# Patient Record
Sex: Male | Born: 1940
Health system: Southern US, Community
[De-identification: ages and names within clinical notes are randomized; demographics above are authoritative.]

## PROBLEM LIST (undated history)

## (undated) DIAGNOSIS — M1990 Unspecified osteoarthritis, unspecified site: Secondary | ICD-10-CM

## (undated) DIAGNOSIS — I10 Essential (primary) hypertension: Secondary | ICD-10-CM

## (undated) DIAGNOSIS — K219 Gastro-esophageal reflux disease without esophagitis: Secondary | ICD-10-CM

## (undated) DIAGNOSIS — R251 Tremor, unspecified: Secondary | ICD-10-CM

## (undated) DIAGNOSIS — I219 Acute myocardial infarction, unspecified: Secondary | ICD-10-CM

## (undated) DIAGNOSIS — E785 Hyperlipidemia, unspecified: Secondary | ICD-10-CM

## (undated) DIAGNOSIS — H409 Unspecified glaucoma: Secondary | ICD-10-CM

## (undated) DIAGNOSIS — I2510 Atherosclerotic heart disease of native coronary artery without angina pectoris: Secondary | ICD-10-CM

## (undated) HISTORY — PX: KNEE SURGERY: SHX244

## (undated) HISTORY — DX: Hyperlipidemia, unspecified: E78.5

## (undated) HISTORY — PX: OTHER SURGICAL HISTORY: SHX169

## (undated) HISTORY — DX: Atherosclerotic heart disease of native coronary artery without angina pectoris: I25.10

## (undated) HISTORY — PX: BACK SURGERY: SHX140

## (undated) HISTORY — PX: EYE SURGERY: SHX253

## (undated) HISTORY — DX: Essential (primary) hypertension: I10

## (undated) HISTORY — PX: HAND SURGERY: SHX662

## (undated) HISTORY — PX: ELBOW SURGERY: SHX618

## (undated) HISTORY — PX: SHOULDER SURGERY: SHX246

---

## 2000-10-17 ENCOUNTER — Ambulatory Visit (HOSPITAL_BASED_OUTPATIENT_CLINIC_OR_DEPARTMENT_OTHER): Admission: RE | Admit: 2000-10-17 | Discharge: 2000-10-17 | Payer: Self-pay | Admitting: Orthopedic Surgery

## 2000-12-19 ENCOUNTER — Encounter (INDEPENDENT_AMBULATORY_CARE_PROVIDER_SITE_OTHER): Payer: Self-pay | Admitting: *Deleted

## 2000-12-19 ENCOUNTER — Ambulatory Visit (HOSPITAL_BASED_OUTPATIENT_CLINIC_OR_DEPARTMENT_OTHER): Admission: RE | Admit: 2000-12-19 | Discharge: 2000-12-20 | Payer: Self-pay | Admitting: Orthopedic Surgery

## 2001-05-29 ENCOUNTER — Ambulatory Visit (HOSPITAL_BASED_OUTPATIENT_CLINIC_OR_DEPARTMENT_OTHER): Admission: RE | Admit: 2001-05-29 | Discharge: 2001-05-30 | Payer: Self-pay | Admitting: Orthopedic Surgery

## 2006-03-06 DIAGNOSIS — I219 Acute myocardial infarction, unspecified: Secondary | ICD-10-CM

## 2006-03-06 HISTORY — DX: Acute myocardial infarction, unspecified: I21.9

## 2006-03-06 HISTORY — PX: CORONARY ANGIOPLASTY: SHX604

## 2006-03-06 HISTORY — PX: CARDIAC CATHETERIZATION: SHX172

## 2006-07-28 ENCOUNTER — Ambulatory Visit: Payer: Self-pay | Admitting: Internal Medicine

## 2006-07-28 ENCOUNTER — Encounter: Payer: Self-pay | Admitting: Emergency Medicine

## 2006-07-28 ENCOUNTER — Inpatient Hospital Stay (HOSPITAL_COMMUNITY): Admission: EM | Admit: 2006-07-28 | Discharge: 2006-08-01 | Payer: Self-pay | Admitting: Internal Medicine

## 2006-08-20 ENCOUNTER — Ambulatory Visit: Payer: Self-pay | Admitting: Cardiology

## 2006-10-19 ENCOUNTER — Ambulatory Visit: Payer: Self-pay | Admitting: Cardiology

## 2006-10-19 LAB — CONVERTED CEMR LAB
ALT: 24 units/L (ref 0–53)
Albumin: 3.7 g/dL (ref 3.5–5.2)
Alkaline Phosphatase: 102 units/L (ref 39–117)
LDL Cholesterol: 38 mg/dL (ref 0–99)
Total CHOL/HDL Ratio: 3.3

## 2006-12-07 ENCOUNTER — Ambulatory Visit: Payer: Self-pay | Admitting: Cardiology

## 2007-03-15 ENCOUNTER — Ambulatory Visit: Payer: Self-pay

## 2007-06-18 ENCOUNTER — Ambulatory Visit: Payer: Self-pay | Admitting: Cardiology

## 2008-06-24 DIAGNOSIS — E785 Hyperlipidemia, unspecified: Secondary | ICD-10-CM

## 2008-06-24 DIAGNOSIS — I10 Essential (primary) hypertension: Secondary | ICD-10-CM

## 2008-06-30 DIAGNOSIS — I2510 Atherosclerotic heart disease of native coronary artery without angina pectoris: Secondary | ICD-10-CM

## 2008-07-01 ENCOUNTER — Ambulatory Visit: Payer: Self-pay | Admitting: Cardiology

## 2009-02-11 ENCOUNTER — Encounter: Payer: Self-pay | Admitting: Cardiology

## 2009-07-02 ENCOUNTER — Ambulatory Visit: Payer: Self-pay | Admitting: Cardiology

## 2010-04-05 NOTE — Assessment & Plan Note (Signed)
Summary: 1 YR F/U   Visit Type:  Follow-up- 1 year Primary Provider:  Dr. Quintella Reichert  CC:  No complaints of CP or SOB. The pt does have some occasional LE edema- but he relates this to his knee problems. He may be pending knee surgery in the near future. He is pending laser surgery to his eyes for glaucoma..  History of Present Illness: The patient is 70 years old and return for followup management of CAD. In 2007 he had a non-ST elevation MI treated with a Taxus stent to the diagonal branch of the LAD. He had 70-80% narrowing the right coronary and later had a negative Myoview scan.  He has done quite well over the past 2 years with no chest pain shortness of breath or palpitations. He's remained active.  His other problems include hypertension and hyperlipidemia.  Since Dr. Harlene Salts retirement he is now being seen in Dr. Jearl Klinefelter office.  Current Medications (verified): 1)  Inderal La 60 Mg Xr24h-Cap (Propranolol Hcl) .Marland Kitchen.. 1 Capsule By Mouth Once A Day 2)  Lisinopril 10 Mg Tabs (Lisinopril) .... Take 1 Tablet Once A Day 3)  Simvastatin 40 Mg Tabs (Simvastatin) .... Take 1 Tablet By Mouth At Bedtime 4)  Aspirin 81 Mg Tbec (Aspirin) .... Take One Tablet By Mouth Daily 5)  Chlordiazepoxide Hcl 25 Mg Caps (Chlordiazepoxide Hcl) .... Take 1 By Mouth Once Daily 6)  Travatan Z 0.004 % Soln (Travoprost) .... As Directed 7)  Aleve 220 Mg Tabs (Naproxen Sodium) .... As Needed 8)  Propoxyphene N-Apap 100-500 Mg Tabs (Propoxyphene N-Apap) .... Take 1 Tablet Every 6 Hrs As Needed Pain 9)  Nexium 40 Mg Cpdr (Esomeprazole Magnesium) .... Take One Tab By Mouth Once Daily As Needed  Allergies (verified): No Known Drug Allergies  Past History:  Past Medical History: Reviewed history from 06/24/2008 and no changes required. 1. Coronary artery disease status post non-ST-elevation myocardial     infarction in May 2008 with treatment of a diagonal branch lesion     with a Taxus drug-eluting stent. 2.  Seventy to eighty percent residual stenosis in the right coronary     but with a negative Myoview scan. 3. Good left ventricular function. 4. Adapta DES study. 5. Hyperlipidemia with low HDL, improved on simvastatin. 6. Hypertension.   Review of Systems       ROS is negative except as outlined in HPI.   Vital Signs:  Patient profile:   70 year old male Height:      68 inches Weight:      229.50 pounds BMI:     35.02 Pulse rate:   67 / minute Pulse rhythm:   regular BP sitting:   118 / 64  (left arm) Cuff size:   large  Vitals Entered By: Sherri Rad, RN, BSN (July 02, 2009 9:13 AM)  Physical Exam  Additional Exam:  Gen. Well-nourished, in no distress   Neck: No JVD, thyroid not enlarged, no carotid bruits Lungs: No tachypnea, clear without rales, rhonchi or wheezes Cardiovascular: Rhythm regular, PMI not displaced,  heart sounds  normal, no murmurs or gallops, no peripheral edema, pulses normal in all 4 extremities. Abdomen: BS normal, abdomen soft and non-tender without masses or organomegaly, no hepatosplenomegaly. MS: No deformities, no cyanosis or clubbing   Neuro:  No focal sns   Skin:  no lesions    Impression & Recommendations:  Problem # 1:  CAD, NATIVE VESSEL (ICD-414.01) He had previous PCI procedure in 2007 with a  drug-eluting stent to the diagonal branch. He's had no recent chest pain and this problem is stable. His updated medication list for this problem includes:    Inderal La 60 Mg Xr24h-cap (Propranolol hcl) .Marland Kitchen... 1 capsule by mouth once a day    Lisinopril 10 Mg Tabs (Lisinopril) .Marland Kitchen... Take 1 tablet once a day    Aspirin 81 Mg Tbec (Aspirin) .Marland Kitchen... Take one tablet by mouth daily  Orders: EKG w/ Interpretation (93000)  Problem # 2:  HYPERTENSION, CONTROLLED (ICD-401.1) This is well-controlled on current medications. His updated medication list for this problem includes:    Inderal La 60 Mg Xr24h-cap (Propranolol hcl) .Marland Kitchen... 1 capsule by mouth once  a day    Lisinopril 10 Mg Tabs (Lisinopril) .Marland Kitchen... Take 1 tablet once a day    Aspirin 81 Mg Tbec (Aspirin) .Marland Kitchen... Take one tablet by mouth daily  Problem # 3:  HYPERLIPIDEMIA (ICD-272.4) He had recent lipid profile at his primary care physician's office. Will S. Anderson as those reports. His updated medication list for this problem includes:    Simvastatin 40 Mg Tabs (Simvastatin) .Marland Kitchen... Take 1 tablet by mouth at bedtime  Patient Instructions: 1)  Your physician wants you to follow-up in: 1 year with Dr. Clifton James.  You will receive a reminder letter in the mail two months in advance. If you don't receive a letter, please call our office to schedule the follow-up appointment. 2)  Please have Dr. Jearl Klinefelter office fax [place] a copy of your last cholesterol panel. Prescriptions: SIMVASTATIN 40 MG TABS (SIMVASTATIN) Take 1 tablet by mouth at bedtime  #30 x 12   Entered by:   Sherri Rad, RN, BSN   Authorized by:   Lenoria Farrier, MD, [facility]   Signed by:   Sherri Rad, RN, BSN on 07/02/2009   Method used:   Electronically to        [facility] [address] [address] [phone]* (retail)       [address]       [place], [place]  [address]       Ph: [phone]       Fax: [phone]   RxID:   [id number] LISINOPRIL 10 MG TABS (LISINOPRIL) Take 1 tablet once a day  #30 x 12   Entered by:   Sherri Rad, RN, BSN   Authorized by:   Lenoria Farrier, MD, [facility]   Signed by:   Sherri Rad, RN, BSN on 07/02/2009   Method used:   Electronically to        [facility] [address] [address] [phone]* (retail)       [address]       [place], [place]  [address]       Ph: [phone]       Fax: [phone]   RxID:   [id number] INDERAL LA 60 MG XR24H-CAP (PROPRANOLOL HCL) 1 capsule by mouth once a day  #30 x 12   Entered by:   Sherri Rad, RN, BSN   Authorized by:   Lenoria Farrier, MD, [facility]   Signed by:   Sherri Rad, RN, BSN on 07/02/2009   Method used:   Electronically to        [facility] [address] [phone]* (retail)       [address]       [place], [place]  [address]       Ph: [phone]       Fax: [phone]   RxID:   [id number]

## 2010-06-15 ENCOUNTER — Other Ambulatory Visit: Payer: Self-pay | Admitting: *Deleted

## 2010-06-15 MED ORDER — SIMVASTATIN 40 MG PO TABS: 40.0000 mg | ORAL_TABLET | Freq: Every day | ORAL | Status: DC

## 2010-06-15 MED ORDER — LISINOPRIL 10 MG PO TABS: 10.0000 mg | ORAL_TABLET | Freq: Every day | ORAL | Status: DC

## 2010-06-15 MED ORDER — PROPRANOLOL HCL ER 60 MG PO CP24: 60.0000 mg | ORAL_CAPSULE | Freq: Every day | ORAL | Status: DC

## 2010-07-19 NOTE — Assessment & Plan Note (Signed)
[facility]                            CARDIOLOGY OFFICE NOTE   NAME:[name] [name], [name] [name]                         MRN:          [id number]  DATE:06/18/2007                            DOB:          Jan 16, 1941    PRIMARY CARE PHYSICIAN:  Lianne Bushy, M.D.   CLINICAL HISTORY:  Mr. Tuman is 70 years old and returns for management  of coronary heart disease.  In May of 2007, had had a non-ST-elevation  myocardial infarction and underwent stenting of diagonal branch of the  LAD with a Taxus stent.  He had residual 70-80% narrowing in the right  coronary.  He had a Myoview scan performed in January of year which  showed no evidence of ischemia.  His ejection fraction was 66%.   He said he has been feeling well.  He has had no chest pain, shortness  breath or palpitations.  He did complain of very easy bruising.   PAST MEDICAL HISTORY:  Hyperlipidemia.  His HDL was low, and we switched  him from Lipitor to simvastatin on the last visit.  His HDL came from 26  to 77 with that switch, and his LDL remained below target.   CURRENT MEDICATIONS:  1. Aspirin.  2. Plavix.  3. Lisinopril 10 mg daily.  4. Inderal LA 6 mg daily.  5. Simvastatin 40 mg daily.   PHYSICAL EXAMINATION:  VITAL SIGNS:  Blood pressure is 128/78, pulse 64  and regular.  NECK:  There is no venous tension.  The carotid pulses were full without  bruits.  CHEST:  Clear.  CARDIAC:  Rhythm was regular.  I could hear no murmurs or gallops.  ABDOMEN:  Soft without organomegaly.  EXTREMITIES:  Peripheral pulses were full with no peripheral edema.   IMPRESSION:  1. Coronary artery disease status post non-ST-elevation myocardial      infarction in May 2008 with treatment of a diagonal branch lesion      with a Taxus drug-eluting stent.  2. Seventy to eighty percent residual stenosis in the right coronary      but with a negative Myoview scan.  3. Good left ventricular function.  4. Adapta ES  study.  5. Hyperlipidemia with low HDL, improved on simvastatin.  6. Hypertension.   RECOMMENDATIONS:  I think Mr. Bonus is doing well.  He is having quite  a bit of bruising, so I think we can stop the Plavix a year post  intervention, which will be the end of May.  With renew his other  medications.  His HDL is still low, and I encouraged him to avoid the  bad carbohydrates, and we talked about setting a target weight of 200  pounds; he is currently 223.  I will plan to see him back in follow up  in a year.     Bruce Elvera Lennox Juanda Chance, MD, [facility]  Electronically Signed    BRB/MedQ  DD: 06/18/2007  DT: 06/18/2007  Job #: [id number]

## 2010-07-19 NOTE — H&P (Signed)
NAME:  [name] [name], [name] [name]                ACCOUNT NO.:  [id number]   MEDICAL RECORD NO.:  [id number]          PATIENT TYPE:  EMS   LOCATION:  ED                           FACILITY:  [facility]   PHYSICIAN:  Bevelyn Buckles. Bensimhon, MDDATE OF BIRTH:  1940/07/22   DATE OF ADMISSION:  07/28/2006  DATE OF DISCHARGE:                              HISTORY & PHYSICAL   PRIMARY CARE PHYSICIAN:  Lianne Bushy, M.D.   REASON FOR ADMISSION:  Left chest, jaw, and shoulder pain, concerning  for possible non-ST elevation, myocardial infarction.   HISTORY OF PRESENT ILLNESS:  Mr. Moroney is a very pleasant 70 year old  male with a history of glaucoma and obesity.  He denies any history of  hypertension, hyperlipidemia, or known coronary artery disease.  He did  have a coronary catheterization approximately 20 years ago by Dr. Juanda Chance  for chest pain, which he tells me was okay.  This morning, he had a 2-3  hour episode of upper left chest pain radiating to his jaw and  shoulders.  There was no associated nausea, vomiting, diaphoresis, or  shortness of breath.  He came to the emergency room.  This was relieved  spontaneously.  EKG was nonacute.  Point-of-care markers showed a CK of  119 with an MB fraction of 5.9.  Troponin was 0.15.  He remains  painfree.   REVIEW OF SYSTEMS:  He is fairly active but somewhat limited by  bilateral knee osteoarthritis.  He denies any exertional discomfort.  He  has not had any heart failure symptoms.  He denies fevers, chills,  cough.  No bright red blood per rectum or melena.  No claudication.  He  does have a history of glaucoma and has undergone multiple laser eye  surgeries, most recently on the left eye yesterday.  Otherwise, the  remainder of the review of systems is negative except for HPI and  problem list.   PROBLEM LIST:  1. Obesity.  2. Glaucoma, status post laser eye surgery x2 on the right and x2 on      the left, most recently on May 23.  3. Osteoarthritis.  4.  Essential tremor.   CURRENT MEDICATIONS:  1. Inderal 60 mg a day.  2. Librium 25 mg a day.  3. Travatan eye drops 1 drop in each eye daily.  4. Cosopt eye drops 1 drop in each eye b.i.d.   He does not take aspirin.   ALLERGIES:  No known drug allergies.   SOCIAL HISTORY:  He is retired.  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  He denies a history of tobacco.  He  does drink one beer every few weeks.   FAMILY HISTORY:  Mother is alive at 67.  No coronary artery disease.  Father died at 68 due to a brain tumor.  One brother who died recently  due to stomach cancer.  He has a half brother and half sister as well.   PHYSICAL EXAMINATION:  GENERAL:  He is well-appearing in no acute  distress.  Respirations are unlabored.  VITAL SIGNS:  Blood pressure 150/88, heart rate 64.  He is satting 99%  on room air.  Respirations are 18.  HEENT:  Normal.  NECK:  Supple.  No JVD.  Carotids are 2+ bilaterally without bruits.  There is no lymphadenopathy or thyromegaly.  CARDIAC:  PMI is normal.  He has a regular rate and rhythm.  No murmurs,  rubs or gallops.  CHEST WALL:  Nontender to palpation.  LUNGS:  Clear.  ABDOMEN:  Obese, nontender, nondistended.  No hepatosplenomegaly.  No  bruits.  No masses appreciated.  He has good bowel sounds.  Abdominal  aorta and pulses not palpable.  EXTREMITIES:  Warm with no clubbing or cyanosis.  He has trace edema  bilaterally.  Good distal pulses.  No rash.  NEURO:  He is alert and oriented x3.  Cranial nerves II-XII are intact.  He move all four extremities without difficulty.  Affect is pleasant.   EKG shows normal sinus rhythm at a rate of 64.  He has LVH with mild ST  segment sagging in V3 and V4, which is nonspecific.   LABS:  White count 6.9, hemoglobin 15.9, platelet count 197.  D-dimer is  negative at 0.48.  Sodium is 140, potassium 4.3, chloride 107, bicarb  27, glucose 121, BUN 13, creatinine 1.05.  Total CK was  119, MB 5.9,  troponin is 0.15.  The second set of cardiac markers just came back with  a total CK of 292, CK-MB 26.4, consistent with non-ST elevation  myocardial infarction.   ASSESSMENT:  1. Non-ST elevation myocardial infarction.  2. Hypertension, an apparent new diagnosis.  3. Glaucoma, status post laser eye surgery, most recently on the left      on May 23.  4. Left ventricular hypertrophy.   PLAN/DISCUSSION:  Mr. Cokley presents with a non-ST elevation myocardial  infarction.  We will continue beta blocker, start him on Lovenox,  statin, and aspirin.  Also start him on Integrilin if okay with  ophthalmology.  Will need to set him up for cardiac catheterization on  Tuesday or Wednesday.  Of note, the patient is very reluctant to stay,  but I tried to emphasize to him the need for medical care for his heart  attack and the risk of leaving the hospital.  We will transfer him to  the step-down unit at [facility] for closer observation in his peri-  infarct period.      Bevelyn Buckles. Bensimhon, MD  Electronically Signed     DRB/MEDQ  D:  07/28/2006  T:  07/28/2006  Job:  [id number]   cc:   Lianne Bushy, M.D.  Fax: [phone]

## 2010-07-19 NOTE — Cardiovascular Report (Signed)
NAME:  [name] [name], [name] [name]                ACCOUNT NO.:  [id number]   MEDICAL RECORD NO.:  [id number]          PATIENT TYPE:  INP   LOCATION:  2919                         FACILITY:  [facility]   PHYSICIAN:  Bruce R. Juanda Chance, MD, FACCDATE OF BIRTH:  1940/06/04   DATE OF PROCEDURE:  07/31/2006  DATE OF DISCHARGE:                            CARDIAC CATHETERIZATION   HISTORY:  [name] [name] is 70 years old and is known to me 20 years ago.  He was admitted to the hospital with chest pain and positive enzymes,  consistent with a non-ST-elevation myocardial infarction.  He is  scheduled for evaluation angiography today.  He was started on  Integrilin and Lovenox.   PROCEDURE:  The procedure was performed by the right femoral artery  using arterial sheath and 6-French preformed coronary catheters.  A  front wall arterial puncture was performed and Omnipaque contrast was  used.  At completion of the diagnostic study, we made a decision to  proceed with intervention on the totally occluded diagonal branch of the  LAD.   The patient had been given Lovenox within 6 hours, so no additional  anticoagulation was given.  He was on an Integrilin drip.  He was given  600 mg of Plavix, 20 mg of Pepcid and 4 chewable baby aspirin.  We used  a 6-French Q-4 guiding catheter with side holes and a Prowater wire.  We  crossed the totally occluded diagonal branch with the wire without too  much difficulty.  We predilated with a 2.25 x 12-mm Maverick, performing  2 inflations up to 8 atmospheres for 30 seconds.  We then deployed a 2.5  x 20 mm Taxus stent, deploying this with one inflation of 14 atmospheres  for 30 seconds.  We then postdilated with a 2.75 x 15-mm Quantum  Maverick, performing 2 inflations up to 16 atmospheres for 20 seconds.   Final diagnostic studies were then  informed through the guiding  catheter.  The patient tolerated the procedure well and left the  laboratory in satisfactory condition.   RESULTS:  1. The left main coronary was free of significant disease.  2. Left anterior descending artery gave rise to a large diagonal      branch, which was completely occluded near its proximal portion.      The LAD then continued but ended before the apex.  It supplied 2      moderate sized septal perforators.  3. The circumflex artery gave rise to a small and large marginal      branch, and a large posterolateral branch.  These vessels were free      of disease.  4. The right coronary artery was a moderate-sized vessel; it gave rise      to a conus branch, a right ventricle branch and a posterior      descending branch.  There was 70-80% narrowing in the proximal      right coronary artery.  It was not certain whether this was      catheter-induced spasm or a lesion.  It looked worse on the second  injection than the first injection, which might suggest there was      some element of spasm.   LEFT VENTRICULOGRAPHY:  The left ventriculogram performed in the RAO  projection showed hypokinesis of the anterolateral wall out to the tip  of the apex.  The vessel wall motion was good.  The estimated ejection  fraction was 60%.   Following stenting of the lesion, the diagonal branch stenosis improved  from 100% to 0%, and the flow improved from TIMI 0 to TIMI 3 flow.   CONCLUSION:  1. Non-ST elevation myocardial infarction, with total occlusion of a      large diagonal branch of the LAD, no major obstruction of the      circumflex artery, 70-80% narrowing in the proximal right coronary,      and anterolateral wall hypokinesis with an estimated ejection      fraction of 50%.  2. Successful percutaneous coronary intervention of the totally      occluded diagonal branch, using a Taxus drug-eluting stent -- with      improvement in percent diameter narrowing from 100% to 0%, and      improvement flow from TIMI 0 to TIMI 3 flow.   DISPOSITION:  The patient to the post anesthesia unit  for further  observation.  I think medical treatment would be the best option for the  residual right coronary disease, and some of this may be spasm.  It is  does not appear real favorable for percutaneous coronary intervention,  although this could be done.      Bruce Elvera Lennox Juanda Chance, MD, [facility]  Electronically Signed     BRB/MEDQ  D:  07/31/2006  T:  08/01/2006  Job:  [id number]   cc:   [name] [name], M.D.

## 2010-07-19 NOTE — Assessment & Plan Note (Signed)
[facility]                            CARDIOLOGY OFFICE NOTE   NAME:Lundblad, Kamali                         MRN:          [id number]  DATE:08/20/2006                            DOB:          1941-01-25    CARDIOLOGIST:  Everardo Beals. Juanda Chance, MD, [facility].   PRIMARY CARE PHYSICIAN:  Lianne Bushy, M.D.   HISTORY OF PRESENT ILLNESS:  Mr. Lucci is a 70 year old male patient  who recently presented to [facility] with a non-ST elevation  myocardial infarction.  He underwent cardiac catheterization by Dr.  Juanda Chance and was found to have a total occlusion of the large diagonal  branch of the LAD.  This was treated with a TAXUS drug-eluting stent.  He had residual 70-80% narrowing in the proximal RCA.  This was treated  medically.  His EF was 50% with anterolateral wall hypokinesis.  He  returns to the office today for post hospitalization followup.  He is  doing well without any chest pain, shortness of breath.  He denies any  syncope or near syncope.  He denies any orthopnea, paroxysmal nocturnal  dyspnea.  He denies any palpitations.  He is having trouble with his  tremors and would like to switch back from Toprol to Inderal.   CURRENT MEDICATIONS:  1. Aspirin 81 mg daily.  2. Plavix 75 mg daily.  3. Lipitor 80 mg q.h.s.  4. Toprol XL 25 mg daily.  5. Lisinopril 10 mg daily.  6. Travatan eye drops.  7. Cosopt eye drops.  8. Nexium 40 mg daily.  9. Generic Librium 25 mg a day.   ALLERGIES:  No known drug allergies.   PHYSICAL EXAMINATION:  GENERAL:  He is a well nourished, well developed  male in no distress.  VITAL SIGNS:  Blood pressure is 112/72, pulse 68, weight 220 pounds.  HEENT:  Normal.  NECK:  Without JVD.  CARDIAC:  S1 S2.  Regular rate and rhythm without murmurs.  LUNGS:  Clear to auscultation bilaterally without wheezing, rhonchi, or  rales.  ABDOMEN:  Soft, nontender with normoactive bowel sounds.  No  organomegaly.  EXTREMITIES:   Without edema.  Calves soft nontender.  SKIN:  Warm and dry.  NEUROLOGIC:  He is alert and oriented x3.  Cranial nerves II-XII are  grossly intact.  Resting tremor noted.  Right femoral arteriotomy site  without bruit or hematoma.   Electrocardiogram reveals sinus rhythm, heart rate of 68, evolving  lateral MI with Q waves in I, aVL, and deep T wave inversions, otherwise  no acute changes.   IMPRESSION:  1. Coronary artery disease.      a.     Status post non-ST elevation myocardial infarction.  Peak       troponin was 15.22.      b.     Status post TAXUS drug-eluting stent placement to the large       diagonal branch of the left anterior descending artery.      c.     Residual 70-80% proximal right coronary artery stenosis -  medical therapy.      d.     Adapt DES study.  2. Preserved left ventricular function with an ejection fraction of      60% at cardiac catheterization with hypokinesis and anterolateral      wall, the tip of the apex.  3. Dyslipidemia, treated.  4. Hypertension.  5. Benign essential tremor.  6. Glaucoma.  7. Obesity.  8. Osteoarthritis.   PLAN:  The patient presents to the office today for post hospitalization  followup.  He is doing well from a cardiovascular standpoint.  He is  slowly progressing and does not have any symptoms of angina.  He would  like to switch his Toprol back to Inderal which did alleviate his  tremors in the past.  The patient was also interviewed and examined by  Dr. Juanda Chance today.  We have recommended that he can go ahead and get back  on his Inderal.  We will get lipids and LFTs checked in about 6-8 weeks.  He will follow up with Dr. Juanda Chance in about 3 months' time.      Tereso Newcomer, PA-C  Electronically Signed      Everardo Beals. Juanda Chance, MD, [facility]  Electronically Signed   SW/MedQ  DD: 08/20/2006  DT: 08/20/2006  Job #: [id number]   cc:   Lianne Bushy, M.D.

## 2010-07-19 NOTE — Discharge Summary (Signed)
NAME:  [name] [name], [name] [name]                ACCOUNT NO.:  [id number]   MEDICAL RECORD NO.:  [id number]          PATIENT TYPE:  INP   LOCATION:  2919                         FACILITY:  [facility]   PHYSICIAN:  Veverly Fells. Excell Seltzer, MD  DATE OF BIRTH:  1940-04-24   DATE OF ADMISSION:  07/28/2006  DATE OF DISCHARGE:  08/01/2006                               DISCHARGE SUMMARY   DISCHARGE DIAGNOSES:  1. Non-ST-elevation myocardial infarction      a.     Status post left heart cardiac catheterization revealing       total occlusion of a large diagonal branch of the LAD with       successful percutaneous coronary intervention using a Taxus drug-       eluting stent; a 70-80% narrowing in the proximal right coronary       artery.  No major obstruction of the circumflex; anterolateral       wall hypokinesis with an estimated EF of 50%.  2. Hypertension.  3. Glaucoma, status post recent laser eye surgery on the left on      May23, 2008, by Dr. Gwen Pounds of [facility].  4. Obesity.  5. Osteoarthritis.  6. Essential tremor.   DISCHARGE MEDICATIONS:  1. Aspirin 81 mg by mouth daily.  2. Plavix 75 mg by mouth daily.  3. Lipitor 80 mg by mouth daily.  4. Toprol XL 25 mg by mouth daily.  5. Lisinopril 10 mg by mouth daily.  6. Librium 25 mg daily.  7. Travatan eye drops, one drop OU daily.  8. Cosopt, one drop OU twice a day.   DISPOSITION/FOLLOW UP:  [name] [name] is being discharged from [facility] today in stable condition.  He is to follow up first with the  [facility] on [id number],4132, as he is successfully enrolled in  the ADAPT DES study.  Following this, he will see Dr. Juanda Chance at the  [facility] on Monday, June16, 2008, at 2:15  p.m.  At that point, Dr. Juanda Chance will assess the patient post  catheterization as well as assess his adherence to his new medication  regimen, as this is notably a new medication burden for him.  Following  this, he  will see his primary care doctor, Kathrynn Humble, as scheduled  prior.  He will follow-up with his ophthalmologist as scheduled  previously following his postoperative visit.   PROCEDURE PERFORMED:  On [id number],2725, the patient underwent left heart  cardiac catheterization by Dr. Charlies Constable, with findings as described  above in discharge diagnoses.  The patient tolerated the procedure well  with no complications.  His right groin site was clean, dry, intact, no  visible hematoma, and good distal pulses.   CONSULTATIONS:  None.   BRIEF ADMISSION HISTORY AND PHYSICAL:  [name] [name] is a 70 year old white  man with no known history of coronary artery disease but with a history  of cardiac cath in the distant past (? 20 yrs prior), glaucoma  and  obesity who presented on the morning of admission with a 2-3 hour  episode  of upper left-sided chest pain radiating to his jaw and  shoulders.  He did not have any associated nausea, vomiting,  diaphoresis, or shortness of breath.  His pain was relieved  spontaneously.  EKG at that point in time showed no acute changes.  At  his point of care, however, cardiac markers showed a CK of 119 and MB  fraction of 5.9.  Troponin was elevated 0.15.  At the time of admission,  he was pain-free.  Physical exam at that point revealed:  GENERAL:  A normal-appearing man no acute distress.  VITAL SIGNS:  Blood pressure 150/88, heart rate of 64, he was saturating  99% on room air, and respirations were 18.  HEENT:  Nonfocal.  NECK:  Neck was supple with no JVD.  Carotids 2+ bilaterally.  No  bruits, no lymphadenopathy.  CARDIAC:  He had a normal PMI.  He had a regular rate and rhythm.  No  murmurs, rubs, or gallops.  CHEST:  Chest wall was nontender palpation.  LUNGS:  Lungs were clear to auscultation bilaterally.  No wheeze,  rhonchi, or rale.  ABDOMEN:  Soft, nontender, nondistended, somewhat obese.  He had no  hepatosplenomegaly, no bruits, no masses.   EXTREMITIES:  Warm with no clubbing or cyanosis.  He has trace edema  bilaterally.  Good distal pulses.  No rash.  NEUROLOGIC:  He is alert,  oriented x3.  Cranial nerves II-XII are grossly intact.  He moving all  four extremities spontaneously without difficulty.  Affect is pleasant.   His EKG at that point showed a normal sinus rhythm at rate of 64, LVH  with mild ST segment sagging in V3 and V4 which was nonspecific.   ADMISSION LABS:  Please see admission H&P in E-chart.   HOSPITAL COURSE:  1. Non-ST elevation MI.  The patient was admitted and started on beta-      blocker, aspirin, Statin, Lovenox as well as Integrilin after      clearance was made by Ophthalmology given his recent eye surgery.      The patient did well over the weekend.  He remained mostly symptom-      free, however, he did have a episodic throat pain.  Notably, his MB      did rise over the first 48-72 hours of his hospitalization.  When      the catheterization lab was available, Dr. Charlies Constable performed      left heart cardiac catheterization, PCI, and drug-eluting stent, as      described above, was placed.  The patient tolerated this well with      no complications, and on the day of discharge is pain-free and      entirely stable.  He will follow-up with Dr. Charlies Constable as well      as be enrolled into the ADAPT DES study.  We will continue medical      management of coronary artery disease including an aspirin, Statin,      Plavix, ACE inhibitor, beta blocker, as described above.   1. Dyslipidemia.  Fasting lipid panel obtained during this      hospitalization revealed a total cholesterol of 204, a notably      elevated triglyceride level of 403, an HDL of 31, and an LDL that      was unable to be calculated secondary to the above abnormalities.      I discussed this with Dr. Charlies Constable, and we decided at this  point, the most appropriate therapy would be Statin as well as      dietary  modifications.  In the future, the patient may warrant      further treatment with Tricor.  A repeat fasting lipid panel should      be obtained either by Cardiology or his primary care physician in      the next approximately 6-8 weeks.   1. Glaucoma status post recent laser eye surgery.  Again, we cleared      it with Ophthalmology in terms of starting a IIB/IIIA inhibitors.      Otherwise, we continued the patient's home medications in terms of      eye drop therapy.  He will follow up with his ophthalmologist at      [facility] after discharge.  No changes were made      here.   1. Hypertension.  Apparently this is a new diagnosis for him.  He is      being discharged on an ACE inhibitor and beta blocker therapy.  The      patient's blood pressure is adequately controlled at this point in-      house.   1. Osteoarthritis.  No changes were made in his regimen for this.  He      had no acute exacerbations of pain.  2. Obesity.  Lifestyle changes will be stressed to the patient in      terms of dietary modification and regular exercise.  3. Essential tremor.  This was not an acute issue at this point.  He      has been on Librium and Inderal in the past.  I've continued the      Librium, but asked that he hold the Inderal as he is being started      on Toprol.  If he has return of the tremor (which he has not since      being in the hospital) we could restart the inderal at a lower      dose.   DISCHARGE LABS AND VITALS ON DAY OF DISCHARGE:  The patient's  temperature is 36.9 Celsius, heart rate of 97, and blood pressure  122/56, and his O2 saturations were 97% on 2 liters.  However, he will  be removed from oxygen and O2 saturations check before discharge.  Labs:  White count of 9.1, hemoglobin 13.9, platelets 188,000, sodium 135,  potassium 4.8, chloride 104, bicarbonate 25, BUN 14, creatinine 1.07,  and glucose of 115.  Hemoglobin A1c was checked and 5.7.  Lipid  profile  as described above.      Antony Contras, M.D.  Electronically Signed      Veverly Fells. Excell Seltzer, MD  Electronically Signed    GL/MEDQ  D:  08/01/2006  T:  08/01/2006  Job:  [id number]   cc:   Veverly Fells. Excell Seltzer, MD  Everardo Beals. Juanda Chance, MD, [facility]  Lianne Bushy, M.D.

## 2010-07-19 NOTE — Assessment & Plan Note (Signed)
[facility]                            CARDIOLOGY OFFICE NOTE   NAME:[name], [name]                         MRN:          [id number]  DATE:12/07/2006                            DOB:          Apr 03, 1940    PRIMARY CARE PHYSICIAN:  [name] [name].   CLINICAL HISTORY:  [name] [name] is 70 years old and comes in for  management of his coronary heart disease.  In May, he had a non-ST  elevation myocardial infarction, underwent cardiac catheterization and  was found to have a total occlusion of the diagonal branch of the left  anterior descending which was treated with PCI but a residual __________  He has done quite well since that time.  He has had no chest pain,  shortness of breath, or palpitations __________   PAST MEDICAL HISTORY:  Significant for hyperlipidemia, __________   CURRENT MEDICATIONS:  1. Aspirin.  2. Plavix.  3. Lipitor.  4. Lisinopril .  5. Nexium.  6. Inderal LA which he also takes for tremors.   PHYSICAL EXAMINATION:  The patient is 113/80 and the pulse 53 and  regular.  There was no venous distension.  The carotid pulses were full without  bruits.  Chest was clear without rales or rhonchi.  The cardiac rhythm was regular, I could hear no murmurs or gallops.  The abdomen was soft with normal bowel sounds.  Peripheral pulses were full and there was no peripheral edema.   IMPRESSION:  1. Coronary artery disease, status post non-ST elevation myocardial      infarction, May 2008, with percutaneous coronary intervention of a      totally occluded diagonal branch using a Taxus drug-eluting stent.  2. Residual 70% to 80% proximal right coronary artery stenosis.  3. Adapt DES study.  4. Good left ventricular function.  5. Hyperlipidemia with low HDL.  6. Hypertension.  7. Benign essential tremor.   RECOMMENDATIONS:  I think [name] [name] is doing quite well.  His HDL is low and  his LDL is very low, so I think he may do better with  simvastatin and  Lipitor, and we switched him from Lipitor 80 to simvastatin 40.  He is  to have a lipid profile as part of  his physical with Dr. Purnell [name] in the  near future.  We will plan to do a stress test in January to evaluate  him for restenosis of his  stent, and also to assess the physiology of the right coronary lesion.  I will see him back in 6 months and we will do a fasting lipid profile  before that.     Bruce Elvera Lennox Juanda Chance, MD, [facility]  Electronically Signed    BRB/MedQ  DD: 12/07/2006  DT: 12/07/2006  Job #: [id number]   cc:   [name] [name], M.D.

## 2010-07-22 NOTE — Op Note (Signed)
[place]  Patient:    [name] [name], [name] [name] Visit Number: [id number] MRN: [id number]          Service Type: DSU Location: [facility] Attending Physician:  Ronne Binning Dictated by:   Nicki Reaper, M.D. Proc. Date: 05/29/01 Admit Date:  05/29/2001 Discharge Date: 05/30/2001                             Operative Report  PREOPERATIVE DIAGNOSIS:  Arthritis, metacarpal phalangeal joint, right index finger.  POSTOPERATIVE DIAGNOSIS:  Arthritis, metacarpal phalangeal joint, right index finger.  OPERATION PERFORMED:  [facility]ension total joint arthroplasty, metacarpal phalangeal joint, right index finger post-exploration.  SURGEON:  Nicki Reaper, M.D.  ANESTHESIA:  Axillary block.  ANESTHESIOLOGIST:  Kaylyn Layer. Michelle Piper, M.D.  INDICATIONS:  The patient is a 70 year old male with a history of injury to his metacarpal phalangeal joint, index and middle fingers.  He has undergone a replacement.  He is noted to have an injury to the collateral ligament to the index finger, which has not responded.  He has undergone a replacement to the metacarpal phalangeal joint to the middle finger, with a resolution of symptoms.  DESCRIPTION OF PROCEDURE:  The patient was brought to the operating room where an axillary block was carried out without difficulty.  He was prepped and draped using Betadine scrub and solution, with the right arm free.  The limb was exsanguinated with an Esmarch bandage and the tourniquet placed high on the arm was inflated to 250 mmHg.  A longitudinal incision was made over the metacarpal phalangeal joint, and carried down through the subcutaneous tissue. Bleeders were electrocauterized.  The interval between the flexor digitorum communis and extensor and ______ proprius was incised.  The capsule was then elevated separately.  An incision was made dorsally into the capsule.  This revealed the collateral ligaments present on both radial and ulnar  side.  A partial tear was evident on the radial side; however, greater than 50% of the ligament was left intact.  The articular surface showed significant loss of cartilage over its entire aspect, especially on the metacarpal head.  It was decided to proceed with a replacement arthroplasty.  A broach was then used, using an awl.  A hole was made in the metacarpal head approximately one-third down and centrally located.  The awl and guide pin with the broach were then inserted for cutting the metacarpal head.  This was confirmed on AP and lateral x-rays, positioning both for the cut and for positioning of the broach.  The cut was made, removing the distal component of the metacarpal head.  The instruments were removed.  The proximal phalanx was then inspected, isolated, and an awl was placed.  This was found to be slightly low, with the shaft being extremely small for the medullary canal.  This was repositioned after burring a portion of the hole made in the proximal phalanx space, to allow the positioner to be placed more dorsally.  This was reinserted and found to lie in excellent position both AP and lateral.  X-rays confirmed this.  The guide for cutting the proximal phalanx was then inserted.  A spin wave for the proximal phalanx was removed with an oscillating saw.  The cortex was then removed with a bur, to allow the placement of a broach.  It was decided to proceed with a #10.  This was broached.  The rasper was then inserted and  this was found to be extremely tight distally.  It was decided that a #20 would probably not fit, as this was confirmed with x-rays.  A #10 prosthesis was selected and placed.  This lay in good position.  A rasp was then inserted into the metacarpal.  This was enlarged for a #10.  The trial prosthesis placed.  The finger lay in good position.  The position of the prosthesis was confirmed with x-rays, as were the rasp.  The ulnar collateral ligament was  slightly loose.  This was then tightened with a drill hole through the metacarpal neck.  Then #4-0 Mersilene suture was placed.  The joint was copiously irrigated with saline.  A #10 prosthesis was inserted distally and then proximally, and tamped in place.  The finger was placed through a full range of motion.  The prosthesis lay in good position.  The collateral ligament was tightened on the ulnar side.  X-rays confirmed good positioning of both AP lateral with both components.  A full range of motion was possible with the prosthetic replacement in place.  The wound was copiously irrigated with saline.  The capsule was closed with figure-of-eight #4-0 Vicryl sutures.  The extensor tendon was repaired with a running #4-0 Mersilene suture, and the skin was closed with interrupted #5-0 nylon sutures. The wound was then injected with Marcaine prior to closure.  A sterile compressive dressing and splint were applied.  The patient tolerated the procedure well and was taken to the recovery room for observation, in satisfactory condition.  DISPOSITION:  He is admitted for overnight stay for antibiotics.  He will be discharged on Percocet and Keflex. Dictated by:   Nicki Reaper, M.D. Attending Physician:  Ronne Binning DD:  05/29/01 TD:  05/29/01 Job: [id number]

## 2010-07-22 NOTE — Op Note (Signed)
[name]  Patient:    [name] [name], [name] [name] Visit Number: [id number] MRN: [id number]          Service Type: DSU Location: [facility] Attending Physician:  Ronne Binning Dictated by:   Nicki Reaper, M.D. Proc. Date: 12/19/00 Admit Date:  12/19/2000 Discharge Date: 12/20/2000                             Operative Report  PREOPERATIVE DIAGNOSIS:  Traumatic arthritis metacarpal phalangeal joint right middle finger.  POSTOPERATIVE DIAGNOSIS:  Traumatic arthritis middle phalangeal joint right middle finger.  OPERATION:  [facility]ension metacarpophalangeal joint arthroplasty right middle finger.  SURGEON:  Nicki Reaper, M.D.  ANESTHESIA:  Axillary block.  ANESTHESIOLOGIST:  Maren Beach, M.D.  HISTORY:  The patient is a 70 year old male who suffered a traumatic injury to the metacarpophalangeal joint of his right middle finger.  He has pain not responding to conservative treatment.  He has been arthroscoped and found to have a large area of cartilage avulsion.  He is admitted now for metacarpophalangeal joint arthroplasty with [facility] power carbon prosthesis.  PROCEDURE:  The patient was brought to the operating room where an axillary block was carried out without difficulty.  He was prepped and draped using Betadine scrubbing solution with the right arm.  The limb was exsanguinated with an Esmarch bandage and tourniquet placed high on the arm was inflated to 250 mmHg.  A straight incision was made over the metacarpophalangeal joint proximal phalanx, carried down through subcutaneous tissue.  Neurovascular structures were protected and an incision was made longitudinal to the extensor tendon.  This was dissected free from the underlying capsule.  The capsule was then incised using sharp dissection.  The metacarpophalangeal joint was then easily exposed, the area of eburnation avulsed cartilage was immediately apparent.  There was no kissing lesion  on the proximal phalanx.  A trocar was then used to enter the joint after isolating the collateral ligaments into the metacarpal shaft.  The guide was then placed.  X-rays confirmed positioning.  The saw guide was then placed taking care to spare the collateral ligament.  A saw cut was made down to the broach.  The broach was removed and the removal of the metacarpal head completed.  The proximal phalanx was then entered with a trocar.  Again, the guide was placed, confirmed with AP and lateral x-rays, the saw attachment placed, and a wafer thin segment of proximal phalanx subchondral bone was removed.  The prosthesis was then fitted using the broaches, first a 10, then 20, and then 30 size into the proximal phalanx.  The metacarpal was then broached and reamed with the progressive 10, 20, and 30 size proximal broaches.  A trial prosthesis was then inserted proximally and distally, the finger placed through a full range of motion, smooth gliding was present and there was no instability to stressing and full flexion or full extension.  The prosthetic trials were removed and a 30 size prosthesis was then selected.  After irrigation this was inserted and tamped home both proximally and distally.  X-rays confirmed positioning both in the metacarpal and in the proximal phalanx.  The finger was placed through a full range of motion, no instability was present.  The wound was again irrigated.  The capsule was closed with figure-of-eight 4-0 Mersilene sutures.  The extensor tendon closed with a running 4-0 Mersilene suture.  The skin was closed with  a subcuticular 4-0 Monocryl suture. Steri-Strips were applied, sterile compression dressing, splint with the metacarpal phalangeal joint held in extension with the PIP flexed was applied. The patient tolerated the procedure well and was taken to the recovery room for observation in satisfactory condition.  He is admitted for overnight stay for  antibiotics and pain control.  He will be discharged on Percocet and Keflex. Dictated by:   Nicki Reaper, M.D. Attending Physician:  Ronne Binning DD:  12/19/00 TD:  12/19/00 Job: [id number]

## 2010-07-22 NOTE — Op Note (Signed)
. [facility]  Patient:    [name] [name], [name] [name]                       MRN: [id number] Proc. Date: 10/17/00 Adm. Date:  [id number] Attending:  Ronne Binning                           Operative Report  PREOPERATIVE DIAGNOSIS:  Status post injury right hand with pain of metatarsophalangeal joints index and middle, recalcitrant to conservative treatment.  POSTOPERATIVE DIAGNOSIS:  Status post injury right hand with pain of metatarsophalangeal joints index and middle, recalcitrant to conservative treatment.  OPERATION:  Arthroscopy with debridement metatarsophalangeal joint, partial injury ulnar collateral ligament middle finger, avulsion of cartilage from metacarpal head.  SURGEON:   Nicki Reaper, M.D.  ASSISTANT:  Joaquin Courts, R.N.  ANESTHESIA:  Axillary block.  DATE OF OPERATION:  October 17, 2000  ANESTHESIOLOGIST:  Edwin Cap. Zoila Shutter, M.D.  HISTORY:  The patient is a 70 year old male with a history of injury to his right hand.  He has had pain in the metatarsophalangeal joint of his index and middle fingers.  MRI has been inconclusive.  He has not responded to any conservative treatment other than temporary relief with injections.  PROCEDURE:  The patient was brought to the operating room where an axillary block was carried out without difficulty.  He was prepped and draped using Betadine scrub and solution with the right arm free.  The hand was placed in the arthroscopy tower, traction applied to the index and middle fingers expanding the joints.  The joint was inflated from the ulnar side just ulnar to the extensor tendons using a 25 gauge needle.  Once the joint was well-localized and inflated, an incision was made with an 11 blade scalpel, deepened with a hemostat; a blunt trocar was used to enter the joint.  The joint was inspected.  Significant debris was present within the joint.  A moderate synovitis was present.  A radial portal  was opened, irrigation provided without using a pump.  The joint was inspected, cartilage was intact. There was a significant area of tissue within the ulnar side of the metacarpal head.  The scope was then introduced on the radial side.  A debridement was then performed - both radially and ulnarly, alternating the scope.  A partial tear of the ulnar collateral ligament was identified.  There was some whitish material present within the ligament.  A shrinking was then performed with the ArthroWand on #1.  This shrunk the remainder of the ulnar collateral ligament, stabilizing the joint.  The scope was removed. Similarly, the joint was localized with a 25 gauge needle, inflated with 3 cc of saline..  Traction was applied through the middle finger only, the incision made with an 11 blade scalpel, deepened with a hemostat, and a blunt trocar was used to enter the joint.  A large fragment of cartilage was immediately encountered floating within the joint.  A radial portal was opened.  The probe was introduced, the joint inspected, and a large avulsion of the entire articular surface of the metacarpal head was identified.  Mild chondromalacia was present on the proximal aspect of the proximal phalanx articular surface. The cartilaginous loose portions were removed.  A large fold of cartilage was present on the dorsal radial aspect and this was smoothed with a shaver; a 2 mm shaver was used.  The  entire avulsed cartilage was excised and removed. The synovitis was removed, which was fairly significant.  No further treatment was performed at this point.  The instruments were removed, the portals closed with interrupted 5-0 nylon sutures, a sterile compressive dressing was applied.  The patient tolerated the procedure well and was taken to the recovery room for observation in satisfactory condition.  DISPOSITION:  He is discharged home to return to [facility] in one week on  Vicodin and Keflex. DD:  10/17/00 TD:  10/17/00 Job: [id number]

## 2010-07-27 ENCOUNTER — Encounter: Payer: Self-pay | Admitting: Internal Medicine

## 2010-07-28 ENCOUNTER — Ambulatory Visit (INDEPENDENT_AMBULATORY_CARE_PROVIDER_SITE_OTHER): Payer: Medicare PPO | Admitting: Cardiovascular Disease

## 2010-07-28 ENCOUNTER — Encounter: Payer: Self-pay | Admitting: Cardiovascular Disease

## 2010-07-28 VITALS — BP 118/81 | HR 62 | Resp 18 | Ht 69.0 in | Wt 229.0 lb

## 2010-07-28 DIAGNOSIS — E785 Hyperlipidemia, unspecified: Secondary | ICD-10-CM

## 2010-07-28 DIAGNOSIS — I2510 Atherosclerotic heart disease of native coronary artery without angina pectoris: Secondary | ICD-10-CM

## 2010-07-28 MED ORDER — LISINOPRIL 10 MG PO TABS: 10.0000 mg | ORAL_TABLET | Freq: Every day | ORAL | Status: DC

## 2010-07-28 MED ORDER — SIMVASTATIN 40 MG PO TABS: 40.0000 mg | ORAL_TABLET | Freq: Every day | ORAL | Status: DC

## 2010-07-28 MED ORDER — PROPRANOLOL HCL ER 60 MG PO CP24: 60.0000 mg | ORAL_CAPSULE | Freq: Every day | ORAL | Status: DC

## 2010-07-28 NOTE — Assessment & Plan Note (Signed)
Stable.  NO changes. 

## 2010-07-28 NOTE — Patient Instructions (Signed)
Your physician recommends that you schedule a follow-up appointment in: 6 months with Dr. McAlhany.  

## 2010-07-28 NOTE — Progress Notes (Signed)
History of Present Illness:70 yo male with CAD, HTN, HL here today for cardiac follow up. He has been followed in the past by Dr. Juanda Chance. In 2007 he had a non-ST elevation MI treated with a Taxus stent to the diagonal branch of the LAD. He had 70-80% narrowing the right coronary and later had a negative Myoview scan.  No chest pain, SOB, palpitations, near syncope or syncope. He is active.   His primary care is John Little in [place]. His lipids are followed by primary care.     Past Medical History  Diagnosis Date  . Coronary artery disease     status post non-ST-elevation myocardial   infarction in May 2008 with treatment of a diagonal branch lesion  with a Taxus drug-eluting stent.  . Hyperlipidemia     with low HDL, improved on simvastatin.  Marland Kitchen Hypertension     No past surgical history on file.  Current Outpatient Prescriptions  Medication Sig Dispense Refill  . aspirin 81 MG tablet Take 81 mg by mouth daily.        . chlordiazePOXIDE (LIBRIUM) 25 MG capsule Take 25 mg by mouth daily.        Marland Kitchen esomeprazole (NEXIUM) 40 MG capsule Take 40 mg by mouth daily before breakfast.       . lisinopril (PRINIVIL,ZESTRIL) 10 MG tablet Take 1 tablet (10 mg total) by mouth daily.  30 tablet  6  . naproxen sodium (ANAPROX) 220 MG tablet Take 220 mg by mouth as needed.        . propranolol (INDERAL LA) 60 MG 24 hr capsule Take 1 capsule (60 mg total) by mouth daily.  30 capsule  6  . simvastatin (ZOCOR) 40 MG tablet Take 1 tablet (40 mg total) by mouth at bedtime.  30 tablet  6  . travoprost, benzalkonium, (TRAVATAN) 0.004 % ophthalmic solution 1 drop at bedtime.        Marland Kitchen DISCONTD: propoxyphene-acetaminophen (DARVOCET A500) 100-500 MG per tablet Take 1 tablet by mouth every 6 (six) hours as needed.          No Known Allergies  History   Social History  . Marital Status: Single    Spouse Name: N/A    Number of Children: N/A  . Years of Education: N/A   Occupational History  . retired     Social History Main Topics  . Smoking status: Former Smoker    Quit date: 07/27/1980  . Smokeless tobacco: Not on file  . Alcohol Use: Yes  . Drug Use: Not on file  . Sexually Active: Not on file   Other Topics Concern  . Not on file   Social History Narrative  . No narrative on file    No family history on file.  Review of Systems:  As stated in the HPI and otherwise negative.   BP 118/81  Pulse 62  Resp 18  Ht 5\' 9"  (1.753 m)  Wt 229 lb (103.874 kg)  BMI 33.82 kg/m2  Physical Examination: General: Well developed, well nourished, NAD HEENT: OP clear, mucus membranes moist SKIN: warm, dry. No rashes. Neuro: No focal deficits Musculoskeletal: Muscle strength 5/5 all ext Psychiatric: Mood and affect normal Neck: No JVD, no carotid bruits, no thyromegaly, no lymphadenopathy. Lungs:Clear bilaterally, no wheezes, rhonci, crackles Cardiovascular: Regular rate and rhythm. No murmurs, gallops or rubs. Abdomen:Soft. Bowel sounds present. Non-tender.  Extremities: No lower extremity edema. Pulses are 2 + in the bilateral DP/PT.  EKG:NSR, rate 63 bpm.

## 2010-07-28 NOTE — Assessment & Plan Note (Signed)
Stable. Lipids followed in primary care. Continue statin.

## 2010-12-26 ENCOUNTER — Telehealth: Payer: Self-pay | Admitting: Cardiovascular Disease

## 2010-12-26 NOTE — Telephone Encounter (Signed)
Spoke with [name] [name] at [facility] (Dr. Marin Comment) and gave her response from Dr. Clifton James regarding use of Viagra.  Will fax this note to [phone]

## 2010-12-26 NOTE — Telephone Encounter (Signed)
Viagra should be ok to use. Avoid use in same 24 hour period as NTG. cdm

## 2010-12-26 NOTE — Telephone Encounter (Signed)
Will review with Dr. Clifton James

## 2010-12-26 NOTE — Telephone Encounter (Signed)
Per Efraim Kaufmann, calling for the [facility]  Wants  to know if patient health enough to be on Viagra.

## 2011-05-08 ENCOUNTER — Encounter: Payer: Self-pay | Admitting: Cardiovascular Disease

## 2011-05-08 ENCOUNTER — Ambulatory Visit (INDEPENDENT_AMBULATORY_CARE_PROVIDER_SITE_OTHER): Payer: Medicare PPO | Admitting: Cardiovascular Disease

## 2011-05-08 VITALS — BP 126/77 | HR 65 | Ht 68.0 in | Wt 236.0 lb

## 2011-05-08 DIAGNOSIS — I2510 Atherosclerotic heart disease of native coronary artery without angina pectoris: Secondary | ICD-10-CM

## 2011-05-08 DIAGNOSIS — E785 Hyperlipidemia, unspecified: Secondary | ICD-10-CM

## 2011-05-08 MED ORDER — SIMVASTATIN 40 MG PO TABS: 40.0000 mg | ORAL_TABLET | Freq: Every day | ORAL | Status: DC

## 2011-05-08 MED ORDER — PROPRANOLOL HCL ER 60 MG PO CP24: 60.0000 mg | ORAL_CAPSULE | Freq: Every day | ORAL | Status: DC

## 2011-05-08 MED ORDER — NAPROXEN 500 MG PO TABS: 500.0000 mg | ORAL_TABLET | Freq: Two times a day (BID) | ORAL | Status: DC | PRN

## 2011-05-08 MED ORDER — LISINOPRIL 10 MG PO TABS: 10.0000 mg | ORAL_TABLET | Freq: Every day | ORAL | Status: DC

## 2011-05-08 NOTE — Assessment & Plan Note (Addendum)
Stable. Lipids are well controlled. BP is well controlled. Will continue current meds.

## 2011-05-08 NOTE — Progress Notes (Signed)
History of Present Illness: 71 yo male with CAD, HTN, HL here today for cardiac follow up. He has been followed in the past by Dr. Juanda Chance. In 2007 he had a non-ST elevation MI treated with a Taxus stent to the diagonal branch of the LAD. He had 70-80% narrowing the right coronary and later had a negative Myoview scan.   He is here today for follow up. He has had no chest pain, SOB, palpitations, near syncope or syncope. He is active. He is feeling great. He may have knee replacement soon. His left knee is painful when walking.   Primary Care Physician:  Marin Comment  Last Lipid Profile:  followed by primary care  Past Medical History  Diagnosis Date  . Coronary artery disease     status post non-ST-elevation myocardial   infarction in May 2008 with treatment of a diagonal branch lesion  with a Taxus drug-eluting stent.  . Hyperlipidemia     with low HDL, improved on simvastatin.  Marland Kitchen Hypertension     No past surgical history on file.  Current Outpatient Prescriptions  Medication Sig Dispense Refill  . aspirin 81 MG tablet Take 81 mg by mouth daily.        . chlordiazePOXIDE (LIBRIUM) 25 MG capsule Take 25 mg by mouth daily.        Marland Kitchen esomeprazole (NEXIUM) 40 MG capsule Take 40 mg by mouth daily before breakfast.       . fish oil-omega-3 fatty acids 1000 MG capsule Take 1 g by mouth daily.      Marland Kitchen lisinopril (PRINIVIL,ZESTRIL) 10 MG tablet Take 1 tablet (10 mg total) by mouth daily.  30 tablet  8  . naproxen (NAPROSYN) 500 MG tablet Take 500 mg by mouth 2 (two) times daily as needed.      . propranolol (INDERAL LA) 60 MG 24 hr capsule Take 1 capsule (60 mg total) by mouth daily.  30 capsule  8  . simvastatin (ZOCOR) 40 MG tablet Take 1 tablet (40 mg total) by mouth at bedtime.  30 tablet  8  . sulindac (CLINORIL) 150 MG tablet Take 150 mg by mouth once. As needed      . travoprost, benzalkonium, (TRAVATAN) 0.004 % ophthalmic solution 1 drop at bedtime.          No Known  Allergies  History   Social History  . Marital Status: Single    Spouse Name: N/A    Number of Children: N/A  . Years of Education: N/A   Occupational History  . retired    Social History Main Topics  . Smoking status: Former Smoker    Quit date: 07/27/1980  . Smokeless tobacco: Not on file  . Alcohol Use: Yes  . Drug Use: Not on file  . Sexually Active: Not on file   Other Topics Concern  . Not on file   Social History Narrative  . No narrative on file    No family history on file.  Review of Systems:  As stated in the HPI and otherwise negative.   BP 126/77  Pulse 65  Ht 5\' 8"  (1.727 m)  Wt 236 lb (107.049 kg)  BMI 35.88 kg/m2  Physical Examination: General: Well developed, well nourished, NAD HEENT: OP clear, mucus membranes moist SKIN: warm, dry. No rashes. Neuro: No focal deficits Musculoskeletal: Muscle strength 5/5 all ext Psychiatric: Mood and affect normal Neck: No JVD, no carotid bruits, no thyromegaly, no lymphadenopathy. Lungs:Clear bilaterally, no wheezes,  rhonci, crackles Cardiovascular: Regular rate and rhythm. No murmurs, gallops or rubs. Abdomen:Soft. Bowel sounds present. Non-tender.  Extremities: No lower extremity edema. Pulses are 2 + in the bilateral DP/PT.  EKG: Sinus brady, rate 56 bpm.

## 2011-05-08 NOTE — Patient Instructions (Signed)
Your physician wants you to follow-up in:  12 months.  You will receive a reminder letter in the mail two months in advance. If you don't receive a letter, please call our office to schedule the follow-up appointment.   

## 2012-05-22 ENCOUNTER — Ambulatory Visit (INDEPENDENT_AMBULATORY_CARE_PROVIDER_SITE_OTHER): Payer: Medicare PPO | Admitting: Cardiovascular Disease

## 2012-05-22 ENCOUNTER — Encounter: Payer: Self-pay | Admitting: Cardiovascular Disease

## 2012-05-22 VITALS — BP 127/70 | HR 60 | Ht 68.0 in | Wt 241.0 lb

## 2012-05-22 DIAGNOSIS — I2510 Atherosclerotic heart disease of native coronary artery without angina pectoris: Secondary | ICD-10-CM

## 2012-05-22 DIAGNOSIS — E785 Hyperlipidemia, unspecified: Secondary | ICD-10-CM

## 2012-05-22 MED ORDER — NAPROXEN 500 MG PO TABS: 500.0000 mg | ORAL_TABLET | Freq: Two times a day (BID) | ORAL | Status: DC | PRN

## 2012-05-22 MED ORDER — LISINOPRIL 10 MG PO TABS: 10.0000 mg | ORAL_TABLET | Freq: Every day | ORAL | Status: DC

## 2012-05-22 MED ORDER — PROPRANOLOL HCL ER 60 MG PO CP24: 60.0000 mg | ORAL_CAPSULE | Freq: Every day | ORAL | Status: DC

## 2012-05-22 MED ORDER — SIMVASTATIN 40 MG PO TABS: 40.0000 mg | ORAL_TABLET | Freq: Every day | ORAL | Status: DC

## 2012-05-22 NOTE — Patient Instructions (Addendum)
Your physician wants you to follow-up in:  12 months.  You will receive a reminder letter in the mail two months in advance. If you don't receive a letter, please call our office to schedule the follow-up appointment.   

## 2012-05-22 NOTE — Progress Notes (Signed)
History of Present Illness: 72 yo male with CAD, HTN, HL here today for cardiac follow up. He has been followed in the past by Dr. Juanda Chance. In 2007 he had a non-ST elevation MI treated with a Taxus stent to the diagonal branch of the LAD. He had 70-80% narrowing the right coronary and later had a negative Myoview scan.   He is here today for follow up. He has had no chest pain, SOB, palpitations, near syncope or syncope. He is active. He is feeling great. His left knee is painful when walking. He is considering surgery in the next year on his left knee.   Primary Care Physician: Marin Comment   Last Lipid Profile: followed by primary care   Past Medical History  Diagnosis Date  . Coronary artery disease     status post non-ST-elevation myocardial   infarction in May 2008 with treatment of a diagonal branch lesion  with a Taxus drug-eluting stent.  . Hyperlipidemia     with low HDL, improved on simvastatin.  Marland Kitchen Hypertension     No past surgical history on file.  Current Outpatient Prescriptions  Medication Sig Dispense Refill  . aspirin 81 MG tablet Take 81 mg by mouth daily.        . chlordiazePOXIDE (LIBRIUM) 25 MG capsule Take 25 mg by mouth daily.        Marland Kitchen esomeprazole (NEXIUM) 40 MG capsule Take 40 mg by mouth daily before breakfast.       . fish oil-omega-3 fatty acids 1000 MG capsule Take 1 g by mouth daily.      Marland Kitchen lisinopril (PRINIVIL,ZESTRIL) 10 MG tablet Take 1 tablet (10 mg total) by mouth daily.  30 tablet  11  . naproxen (NAPROSYN) 500 MG tablet Take 1 tablet (500 mg total) by mouth 2 (two) times daily as needed.  60 tablet  11  . propranolol (INDERAL LA) 60 MG 24 hr capsule Take 1 capsule (60 mg total) by mouth daily.  30 capsule  11  . simvastatin (ZOCOR) 40 MG tablet Take 1 tablet (40 mg total) by mouth at bedtime.  30 tablet  11  . sulindac (CLINORIL) 150 MG tablet Take 150 mg by mouth once. As needed      . travoprost, benzalkonium, (TRAVATAN) 0.004 % ophthalmic  solution 1 drop at bedtime.         No current facility-administered medications for this visit.    No Known Allergies  History   Social History  . Marital Status: Single    Spouse Name: N/A    Number of Children: N/A  . Years of Education: N/A   Occupational History  . retired    Social History Main Topics  . Smoking status: Former Smoker    Quit date: 07/27/1980  . Smokeless tobacco: Not on file  . Alcohol Use: Yes  . Drug Use: Not on file  . Sexually Active: Not on file   Other Topics Concern  . Not on file   Social History Narrative  . No narrative on file    No family history on file.  Review of Systems:  As stated in the HPI and otherwise negative.   BP 127/70  Pulse 60  Ht 5\' 8"  (1.727 m)  Wt 241 lb (109.317 kg)  BMI 36.65 kg/m2  Physical Examination: General: Well developed, well nourished, NAD HEENT: OP clear, mucus membranes moist SKIN: warm, dry. No rashes. Neuro: No focal deficits Musculoskeletal: Muscle strength 5/5 all ext  Psychiatric: Mood and affect normal Neck: No JVD, no carotid bruits, no thyromegaly, no lymphadenopathy. Lungs:Clear bilaterally, no wheezes, rhonci, crackles Cardiovascular: Regular rate and rhythm. No murmurs, gallops or rubs. Abdomen:Soft. Bowel sounds present. Non-tender.  Extremities: No lower extremity edema. Pulses are 2 + in the bilateral DP/PT.  EKG: NSR, rate 60 bpm. Normal EKG.   Assessment and Plan:   1. CAD: Stable. Lipids are well controlled. BP is well controlled. Will continue current meds. We discussed routine stress testing but he does not wish to pursue at this time. If he decides to have knee surgery, will need to have a stress test [facility]) before the surgery.

## 2013-05-09 ENCOUNTER — Other Ambulatory Visit: Payer: Self-pay

## 2013-05-09 DIAGNOSIS — I2510 Atherosclerotic heart disease of native coronary artery without angina pectoris: Secondary | ICD-10-CM

## 2013-05-09 DIAGNOSIS — E785 Hyperlipidemia, unspecified: Secondary | ICD-10-CM

## 2013-05-09 MED ORDER — SIMVASTATIN 40 MG PO TABS: 40.0000 mg | ORAL_TABLET | Freq: Every day | ORAL | Status: DC

## 2013-05-09 MED ORDER — LISINOPRIL 10 MG PO TABS: 10.0000 mg | ORAL_TABLET | Freq: Every day | ORAL | Status: DC

## 2013-05-09 MED ORDER — PROPRANOLOL HCL ER 60 MG PO CP24: 60.0000 mg | ORAL_CAPSULE | Freq: Every day | ORAL | Status: DC

## 2013-05-26 ENCOUNTER — Encounter: Payer: Self-pay | Admitting: Cardiovascular Disease

## 2013-05-26 ENCOUNTER — Ambulatory Visit (INDEPENDENT_AMBULATORY_CARE_PROVIDER_SITE_OTHER): Payer: Medicare PPO | Admitting: Cardiovascular Disease

## 2013-05-26 VITALS — BP 120/70 | HR 61 | Ht 68.0 in | Wt 241.0 lb

## 2013-05-26 DIAGNOSIS — I2510 Atherosclerotic heart disease of native coronary artery without angina pectoris: Secondary | ICD-10-CM

## 2013-05-26 DIAGNOSIS — I10 Essential (primary) hypertension: Secondary | ICD-10-CM

## 2013-05-26 DIAGNOSIS — E785 Hyperlipidemia, unspecified: Secondary | ICD-10-CM

## 2013-05-26 MED ORDER — SIMVASTATIN 40 MG PO TABS: 40.0000 mg | ORAL_TABLET | Freq: Every day | ORAL | Status: DC

## 2013-05-26 MED ORDER — PROPRANOLOL HCL ER 60 MG PO CP24: 60.0000 mg | ORAL_CAPSULE | Freq: Every day | ORAL | Status: DC

## 2013-05-26 MED ORDER — LISINOPRIL 10 MG PO TABS: 10.0000 mg | ORAL_TABLET | Freq: Every day | ORAL | Status: DC

## 2013-05-26 NOTE — Progress Notes (Signed)
History of Present Illness: 73 yo male with history of CAD, HTN, HLD here today for cardiac follow up. He has been followed in the past by Dr. Juanda ChanceBrodie. In 2007 he had a non-ST elevation MI treated with a Taxus stent to the diagonal branch of the LAD. He had 70-80% narrowing of the right coronary and later had a negative Myoview scan. His RCA disease has been managed medically since then.    He is here today for follow up. He has had no chest pain, SOB, palpitations, near syncope or syncope. He is active. He is feeling great. His left knee is painful when walking. He is considering surgery in the next year on his left knee.   Primary Care Physician: Marin CommentWells, Cheryl   Last Lipid Profile: followed by primary care   Past Medical History  Diagnosis Date  . Coronary artery disease     status post non-ST-elevation myocardial   infarction in May 2008 with treatment of a diagonal branch lesion  with a Taxus drug-eluting stent.  . Hyperlipidemia     with low HDL, improved on simvastatin.  Marland Kitchen. Hypertension     No past surgical history on file.  Current Outpatient Prescriptions  Medication Sig Dispense Refill  . aspirin 81 MG tablet Take 81 mg by mouth daily.        . chlordiazePOXIDE (LIBRIUM) 25 MG capsule Take 25 mg by mouth daily.        Marland Kitchen. esomeprazole (NEXIUM) 40 MG capsule Take 40 mg by mouth daily before breakfast.       . fish oil-omega-3 fatty acids 1000 MG capsule Take 1 g by mouth daily.      Marland Kitchen. lisinopril (PRINIVIL,ZESTRIL) 10 MG tablet Take 1 tablet (10 mg total) by mouth daily.  30 tablet  3  . naproxen (NAPROSYN) 500 MG tablet Take 1 tablet (500 mg total) by mouth 2 (two) times daily as needed.  60 tablet  11  . propranolol ER (INDERAL LA) 60 MG 24 hr capsule Take 1 capsule (60 mg total) by mouth daily.  30 capsule  6  . simvastatin (ZOCOR) 40 MG tablet Take 1 tablet (40 mg total) by mouth at bedtime.  30 tablet  2  . travoprost, benzalkonium, (TRAVATAN) 0.004 % ophthalmic solution 1  drop at bedtime.         No current facility-administered medications for this visit.    No Known Allergies  History   Social History  . Marital Status: Single    Spouse Name: N/A    Number of Children: N/A  . Years of Education: N/A   Occupational History  . retired    Social History Main Topics  . Smoking status: Former Smoker    Quit date: 07/27/1980  . Smokeless tobacco: Not on file  . Alcohol Use: Yes  . Drug Use: Not on file  . Sexual Activity: Not on file   Other Topics Concern  . Not on file   Social History Narrative  . No narrative on file    No family history on file.  Review of Systems:  As stated in the HPI and otherwise negative.   BP 120/70  Pulse 61  Ht 5\' 8"  (1.727 m)  Wt 241 lb (109.317 kg)  BMI 36.65 kg/m2  Physical Examination: General: Well developed, well nourished, NAD HEENT: OP clear, mucus membranes moist SKIN: warm, dry. No rashes. Neuro: No focal deficits Musculoskeletal: Muscle strength 5/5 all ext Psychiatric: Mood and affect  normal Neck: No JVD, no carotid bruits, no thyromegaly, no lymphadenopathy. Lungs:Clear bilaterally, no wheezes, rhonci, crackles Cardiovascular: Regular rate and rhythm. No murmurs, gallops or rubs. Abdomen:Soft. Bowel sounds present. Non-tender.  Extremities: No lower extremity edema. Pulses are 2 + in the bilateral DP/PT.  EKG: NSR, rate 62 bpm. Normal EKG.   Assessment and Plan:   1. CAD: Stable.  Will continue current meds. We discussed adding Plavix to his ASA therapy based on the DAPT trial and the fact that he has a first generation Taxus DES but he refused as he had excessive bruising and bleeding on Plavix in the past.   2. HTN: BP controlled. No changes today.   3. Hyperlipidemia: He is on a statin. Lipids followed in primary care.

## 2013-05-26 NOTE — Patient Instructions (Signed)
Your physician wants you to follow-up in:  12 months.  You will receive a reminder letter in the mail two months in advance. If you don't receive a letter, please call our office to schedule the follow-up appointment.   

## 2013-06-17 ENCOUNTER — Other Ambulatory Visit: Payer: Self-pay | Admitting: *Deleted

## 2013-06-17 DIAGNOSIS — I2510 Atherosclerotic heart disease of native coronary artery without angina pectoris: Secondary | ICD-10-CM

## 2013-06-17 MED ORDER — NAPROXEN 500 MG PO TABS: 500.0000 mg | ORAL_TABLET | Freq: Two times a day (BID) | ORAL | Status: DC | PRN

## 2013-11-17 ENCOUNTER — Telehealth: Payer: Self-pay | Admitting: Cardiovascular Disease

## 2013-11-17 ENCOUNTER — Telehealth: Payer: Self-pay | Admitting: *Deleted

## 2013-11-17 NOTE — Telephone Encounter (Signed)
Walk in pt Form " [facility]" Clearance Dropped Off gave to [facility]  9.14.15/km

## 2013-11-17 NOTE — Telephone Encounter (Signed)
Received request for surgical clearance from [facility] for knee replacement. Dr. Clifton James would like to see pt in office prior to giving clearance.  I spoke with pt and appt made for him to see Dr. Clifton James on November 18, 2013 at 8:45.

## 2013-11-18 ENCOUNTER — Ambulatory Visit (INDEPENDENT_AMBULATORY_CARE_PROVIDER_SITE_OTHER): Payer: Medicare PPO | Admitting: Cardiovascular Disease

## 2013-11-18 ENCOUNTER — Encounter: Payer: Self-pay | Admitting: Cardiovascular Disease

## 2013-11-18 ENCOUNTER — Telehealth: Payer: Self-pay | Admitting: Cardiovascular Disease

## 2013-11-18 VITALS — BP 102/62 | HR 69 | Ht 68.0 in | Wt 237.0 lb

## 2013-11-18 DIAGNOSIS — E785 Hyperlipidemia, unspecified: Secondary | ICD-10-CM

## 2013-11-18 DIAGNOSIS — I10 Essential (primary) hypertension: Secondary | ICD-10-CM

## 2013-11-18 DIAGNOSIS — I2510 Atherosclerotic heart disease of native coronary artery without angina pectoris: Secondary | ICD-10-CM

## 2013-11-18 DIAGNOSIS — Z01810 Encounter for preprocedural cardiovascular examination: Secondary | ICD-10-CM

## 2013-11-18 NOTE — Telephone Encounter (Signed)
Received request from Nurse fax box, documents faxed for surgical clearance. °To: [facility] °Fax number: [phone] °Attention: °[phone]/km °

## 2013-11-18 NOTE — Progress Notes (Signed)
History of Present Illness: 73 yo male with history of CAD, HTN, HLD here today for cardiac follow up. He has been followed in the past by Dr. Juanda Chance. In 2007 he had a non-ST elevation MI treated with a Taxus stent to the diagonal branch of the LAD. He had 70-80% narrowing of the right coronary and later had a negative Myoview scan. His RCA disease has been managed medically since then.    He is here today for follow up. He has had no chest pain, SOB, palpitations, near syncope or syncope. He is active. He is feeling great. His only complaint is left knee pain and he has plans for a knee replacement soon.    Primary Care Physician: Marin Comment  Orthopedics: [facility]  Last Lipid Profile: followed by primary care  Past Medical History  Diagnosis Date  . Coronary artery disease     status post non-ST-elevation myocardial   infarction in May 2008 with treatment of a diagonal branch lesion  with a Taxus drug-eluting stent.  . Hyperlipidemia     with low HDL, improved on simvastatin.  Marland Kitchen Hypertension     Past Surgical History  Procedure Laterality Date  . Back surgery    . Knee surgery Bilateral   . Shoulder surgery Bilateral   . Hand surgery    . Elbow surgery      Current Outpatient Prescriptions  Medication Sig Dispense Refill  . aspirin 81 MG tablet Take 81 mg by mouth daily.        . chlordiazePOXIDE (LIBRIUM) 25 MG capsule Take 25 mg by mouth daily.        Marland Kitchen esomeprazole (NEXIUM) 40 MG capsule Take 40 mg by mouth daily before breakfast.       . lisinopril (PRINIVIL,ZESTRIL) 10 MG tablet Take 1 tablet (10 mg total) by mouth daily.  30 tablet  11  . naproxen (NAPROSYN) 500 MG tablet Take 1 tablet (500 mg total) by mouth 2 (two) times daily as needed.  60 tablet  11  . propranolol ER (INDERAL LA) 60 MG 24 hr capsule Take 1 capsule (60 mg total) by mouth daily.  30 capsule  11  . simvastatin (ZOCOR) 40 MG tablet Take 1 tablet (40 mg total) by mouth at bedtime.  30 tablet   11  . travoprost, benzalkonium, (TRAVATAN) 0.004 % ophthalmic solution 1 drop at bedtime.        . fish oil-omega-3 fatty acids 1000 MG capsule Take 1 g by mouth daily.       No current facility-administered medications for this visit.    No Known Allergies  History   Social History  . Marital Status: Single    Spouse Name: N/A    Number of Children: N/A  . Years of Education: N/A   Occupational History  . retired    Social History Main Topics  . Smoking status: Former Smoker    Quit date: 07/27/1980  . Smokeless tobacco: Not on file  . Alcohol Use: Yes  . Drug Use: Not on file  . Sexual Activity: Not on file   Other Topics Concern  . Not on file   Social History Narrative  . No narrative on file    No family history on file.  Review of Systems:  As stated in the HPI and otherwise negative.   BP 102/62  Pulse 69  Ht  (1.727 m)  Wt 237 lb (107.502 kg)  BMI 36.04 kg/m2  Physical Examination: General: Well developed, well nourished, NAD HEENT: OP clear, mucus membranes moist SKIN: warm, dry. No rashes. Neuro: No focal deficits Musculoskeletal: Muscle strength 5/5 all ext Psychiatric: Mood and affect normal Neck: No JVD, no carotid bruits, no thyromegaly, no lymphadenopathy. Lungs:Clear bilaterally, no wheezes, rhonci, crackles Cardiovascular: Regular rate and rhythm. No murmurs, gallops or rubs. Abdomen:Soft. Bowel sounds present. Non-tender.  Extremities: No lower extremity edema. Pulses are 2 + in the bilateral DP/PT.  EKG: NSR, rate 69 bpm. LVH  Assessment and Plan:   1. CAD: Stable.  Will continue current meds. We discussed adding Plavix to his ASA therapy based on the DAPT trial and the fact that he has a first generation Taxus DES but he refused as he had excessive bruising and bleeding on Plavix in the past.   2. HTN: BP controlled. No changes today.   3. Hyperlipidemia: He is on a statin. Lipids followed in primary care.   4. Pre-operative  cardiovascular examination: He is doing well. NO angina, CHF or arrythmias. He does not need ischemic testing before his planned knee surgery.

## 2013-11-18 NOTE — Patient Instructions (Signed)
Your physician recommends that you continue on your current medications as directed. Please refer to the Current Medication list given to you today.   Your physician wants you to follow-up in: 6 MONTHS WITH DR  MCALHANY You will receive a reminder letter in the mail two months in advance. If you don't receive a letter, please call our office to schedule the follow-up appointment. 

## 2013-11-19 ENCOUNTER — Other Ambulatory Visit: Payer: Self-pay | Admitting: Surgical

## 2013-11-19 MED ORDER — DEXAMETHASONE SODIUM PHOSPHATE 10 MG/ML IJ SOLN: 10.0000 mg | Freq: Once | INTRAMUSCULAR | Status: AC

## 2013-11-21 ENCOUNTER — Encounter (HOSPITAL_COMMUNITY): Payer: Self-pay | Admitting: Pharmacy Technician

## 2013-11-26 ENCOUNTER — Other Ambulatory Visit: Payer: Self-pay | Admitting: Surgical

## 2013-11-26 MED ORDER — TRANEXAMIC ACID 100 MG/ML IV SOLN: 2000.0000 mg | Freq: Once | INTRAVENOUS | Status: DC

## 2013-11-26 NOTE — H&P (Signed)
TOTAL KNEE ADMISSION H&P  Patient is being admitted for left total knee arthroplasty.  Subjective:  Chief Complaint:left knee pain.  HPI: [name] [name], 73 y.o. male, has a history of pain and functional disability in the left knee due to arthritis and has failed non-surgical conservative treatments for greater than 12 weeks to includeNSAID's and/or analgesics, corticosteriod injections, viscosupplementation injections and activity modification.  Onset of symptoms was gradual, starting >10 years ago with gradually worsening course since that time. The patient noted prior procedures on the knee to include  arthroscopy and menisectomy on the left knee(s).  Patient currently rates pain in the left knee(s) at 8 out of 10 with activity. Patient has night pain, worsening of pain with activity and weight bearing, pain that interferes with activities of daily living, pain with passive range of motion, crepitus and joint swelling.  Patient has evidence of periarticular osteophytes and joint space narrowing by imaging studies.There is no active infection.  Patient Active Problem List   Diagnosis Date Noted  . CAD, NATIVE VESSEL 06/30/2008  . HYPERLIPIDEMIA 06/24/2008  . HYPERTENSION, CONTROLLED 06/24/2008   Past Medical History  Diagnosis Date  . Coronary artery disease     status post non-ST-elevation myocardial   infarction in May 2008 with treatment of a diagonal branch lesion  with a Taxus drug-eluting stent.  . Hyperlipidemia     with low HDL, improved on simvastatin.  Marland Kitchen Hypertension     Past Surgical History  Procedure Laterality Date  . Back surgery    . Knee surgery Bilateral   . Shoulder surgery Bilateral   . Hand surgery    . Elbow surgery       Current outpatient prescriptions: aspirin 81 MG tablet, Take 81 mg by mouth every morning. , Disp: , Rfl: ;   chlordiazePOXIDE (LIBRIUM) 25 MG capsule, Take 25 mg by mouth every morning. , Disp: , Rfl: ;   esomeprazole (NEXIUM) 40 MG  capsule, Take 40 mg by mouth daily as needed (indegistion.). , Disp: , Rfl: ;   lisinopril (PRINIVIL,ZESTRIL) 10 MG tablet, Take 10 mg by mouth every morning., Disp: , Rfl:  naproxen (NAPROSYN) 500 MG tablet, Take 500 mg by mouth 2 (two) times daily as needed for mild pain., Disp: , Rfl: ;   naproxen sodium (ANAPROX) 220 MG tablet, Take 220 mg by mouth once as needed (pain)., Disp: , Rfl: ;   propranolol ER (INDERAL LA) 60 MG 24 hr capsule, Take 60 mg by mouth every morning., Disp: , Rfl: ;   simvastatin (ZOCOR) 40 MG tablet, Take 40 mg by mouth every morning., Disp: , Rfl:  travoprost, benzalkonium, (TRAVATAN) 0.004 % ophthalmic solution, Place 1 drop into both eyes at bedtime. , Disp: , Rfl:    No Known Allergies  History  Substance Use Topics  . Smoking status: Former Smoker    Quit date: 07/27/1980  . Smokeless tobacco: No  . Alcohol Use: Yes     Family History Cancer Brother. Heart Disease Father. Osteoporosis Mother.  Review of Systems  Constitutional: Negative.   HENT: Negative.   Eyes: Negative.   Respiratory: Positive for shortness of breath. Negative for cough, hemoptysis, sputum production and wheezing.        SOB with exertion  Cardiovascular: Negative.   Gastrointestinal: Negative.   Genitourinary: Negative.   Musculoskeletal: Positive for back pain and joint pain. Negative for falls, myalgias and neck pain.       Bilateral knee pain  Skin: Negative.  Neurological: Negative.   Endo/Heme/Allergies: Negative.   Psychiatric/Behavioral: Negative.     Objective:  Physical Exam  Constitutional: He is oriented to person, place, and time. He appears well-developed. No distress.  Obese  HENT:  Head: Normocephalic and atraumatic.  Right Ear: External ear normal.  Left Ear: External ear normal.  Nose: Nose normal.  Mouth/Throat: Oropharynx is clear and moist.  Eyes: Conjunctivae and EOM are normal.  Neck: Normal range of motion. Neck supple.  Cardiovascular:  Normal rate, regular rhythm, normal heart sounds and intact distal pulses.   No murmur heard. Respiratory: Effort normal and breath sounds normal. No respiratory distress. He has no wheezes.  GI: Soft. Bowel sounds are normal. He exhibits no distension. There is no tenderness.  Musculoskeletal:       Right hip: Normal.       Left hip: Normal.       Right knee: He exhibits decreased range of motion and swelling. He exhibits no effusion and no erythema. Tenderness found. Medial joint line and lateral joint line tenderness noted.       Left knee: He exhibits decreased range of motion and swelling. He exhibits no effusion and no erythema. Tenderness found. Medial joint line and lateral joint line tenderness noted.       Right lower leg: He exhibits no tenderness, no bony tenderness and no swelling.       Left lower leg: He exhibits no tenderness, no bony tenderness and no swelling.  ROM right knee 5-90 ROM left knee 5-100  Neurological: He is alert and oriented to person, place, and time. He has normal strength and normal reflexes. No sensory deficit.  Skin: No rash noted. He is not diaphoretic. No erythema.  Psychiatric: He has a normal mood and affect. His behavior is normal.     Vitals  Weight: 237 lb Height: 68in Body Surface Area: 2.27 m Body Mass Index: 36.04 kg/m Pulse: 64 (Regular)  BP: 124/80 (Sitting, Left Arm, Standard)  Imaging Review Plain radiographs demonstrate severe degenerative joint disease of the left knee(s). The overall alignment issignificant varus. The bone quality appears to be good for age and reported activity level.  Assessment/Plan:  End stage arthritis, left knee   The patient history, physical examination, clinical judgment of the provider and imaging studies are consistent with end stage degenerative joint disease of the left knee(s) and total knee arthroplasty is deemed medically necessary. The treatment options including medical management,  injection therapy arthroscopy and arthroplasty were discussed at length. The risks and benefits of total knee arthroplasty were presented and reviewed. The risks due to aseptic loosening, infection, stiffness, patella tracking problems, thromboembolic complications and other imponderables were discussed. The patient acknowledged the explanation, agreed to proceed with the plan and consent was signed. Patient is being admitted for inpatient treatment for surgery, pain control, PT, OT, prophylactic antibiotics, VTE prophylaxis, progressive ambulation and ADL's and discharge planning. The patient is planning to be discharged to skilled nursing facility [facility])  Topical TXA  PCP: Marin Comment Cardio: Earney Hamburg   Dimitri Ped, [place]

## 2013-11-26 NOTE — Patient Instructions (Addendum)
22 [name] [name]  11/27/2013   Your procedure is scheduled on: Wednesday 12/03/13  Report to [facility] at 08:30 AM.  Call this number if you have problems the morning of surgery 336-: [phone]   Remember:    Do not eat food or drink liquids After Midnight.     Take these medicines the morning of surgery with A SIP OF WATER: nexium if needed, librium, inderal, simvastatin   Do not wear jewelry, make-up or nail polish.  Do not wear lotions, powders, or perfumes. You may wear deodorant.  Do not shave 48 hours prior to surgery. Men may shave face and neck.  Do not bring valuables to the hospital.  Contacts, dentures or bridgework may not be worn into surgery.  Leave suitcase in the car. After surgery it may be brought to your room.  For patients admitted to the hospital, checkout time is 11:00 AM the day of discharge.   Please read over the following fact sheets that you were given: MRSA Information Birdie Sons, RN  pre op nurse call if needed [phone]    [facility] - Preparing for Surgery Before surgery, you can play an important role.  Because skin is not sterile, your skin needs to be as free of germs as possible.  You can reduce the number of germs on your skin by washing with CHG (chlorahexidine gluconate) soap before surgery.  CHG is an antiseptic cleaner which kills germs and bonds with the skin to continue killing germs even after washing. Please DO NOT use if you have an allergy to CHG or antibacterial soaps.  If your skin becomes reddened/irritated stop using the CHG and inform your nurse when you arrive at Short Stay. Do not shave (including legs and underarms) for at least 48 hours prior to the first CHG shower.  You may shave your face/neck. Please follow these instructions carefully:  1.  Shower with CHG Soap the night before surgery and the  morning of Surgery.  2.  If you choose to wash your hair, wash your hair first as usual with your  normal   shampoo.  3.  After you shampoo, rinse your hair and body thoroughly to remove the  shampoo.                            4.  Use CHG as you would any other liquid soap.  You can apply chg directly  to the skin and wash                       Gently with a scrungie or clean washcloth.  5.  Apply the CHG Soap to your body ONLY FROM THE NECK DOWN.   Do not use on face/ open                           Wound or open sores. Avoid contact with eyes, ears mouth and genitals (private parts).                       Wash face,  Genitals (private parts) with your normal soap.             6.  Wash thoroughly, paying special attention to the area where your surgery  will be performed.  7.  Thoroughly rinse your body with warm water from the neck  down.  8.  DO NOT shower/wash with your normal soap after using and rinsing off  the CHG Soap.                9.  Pat yourself dry with a clean towel.            10.  Wear clean pajamas.            11.  Place clean sheets on your bed the night of your first shower and do not  sleep with pets. Day of Surgery : Do not apply any lotion the morning of surgery.  Please wear clean clothes to the hospital/surgery center.  FAILURE TO FOLLOW THESE INSTRUCTIONS MAY RESULT IN THE CANCELLATION OF YOUR SURGERY PATIENT SIGNATURE_________________________________  NURSE SIGNATURE__________________________________  ________________________________________________________________________  WHAT IS A BLOOD TRANSFUSION? Blood Transfusion Information  A transfusion is the replacement of blood or some of its parts. Blood is made up of multiple cells which provide different functions.  Red blood cells carry oxygen and are used for blood loss replacement.  White blood cells fight against infection.  Platelets control bleeding.  Plasma helps clot blood.  Other blood products are available for specialized needs, such as hemophilia or other clotting disorders. BEFORE THE TRANSFUSION   Who gives blood for transfusions?   Healthy volunteers who are fully evaluated to make sure their blood is safe. This is blood bank blood. Transfusion therapy is the safest it has ever been in the practice of medicine. Before blood is taken from a donor, a complete history is taken to make sure that person has no history of diseases nor engages in risky social behavior (examples are intravenous drug use or sexual activity with multiple partners). The donor's travel history is screened to minimize risk of transmitting infections, such as malaria. The donated blood is tested for signs of infectious diseases, such as HIV and hepatitis. The blood is then tested to be sure it is compatible with you in order to minimize the chance of a transfusion reaction. If you or a relative donates blood, this is often done in anticipation of surgery and is not appropriate for emergency situations. It takes many days to process the donated blood. RISKS AND COMPLICATIONS Although transfusion therapy is very safe and saves many lives, the main dangers of transfusion include:   Getting an infectious disease.  Developing a transfusion reaction. This is an allergic reaction to something in the blood you were given. Every precaution is taken to prevent this. The decision to have a blood transfusion has been considered carefully by your caregiver before blood is given. Blood is not given unless the benefits outweigh the risks. AFTER THE TRANSFUSION  Right after receiving a blood transfusion, you will usually feel much better and more energetic. This is especially true if your red blood cells have gotten low (anemic). The transfusion raises the level of the red blood cells which carry oxygen, and this usually causes an energy increase.  The nurse administering the transfusion will monitor you carefully for complications. HOME CARE INSTRUCTIONS  No special instructions are needed after a transfusion. You may find your energy  is better. Speak with your caregiver about any limitations on activity for underlying diseases you may have. SEEK MEDICAL CARE IF:   Your condition is not improving after your transfusion.  You develop redness or irritation at the intravenous (IV) site. SEEK IMMEDIATE MEDICAL CARE IF:  Any of the following symptoms occur over the next 12 hours:  Shaking chills.  You have a temperature by mouth above 102 F (38.9 C), not controlled by medicine.  Chest, back, or muscle pain.  People around you feel you are not acting correctly or are confused.  Shortness of breath or difficulty breathing.  Dizziness and fainting.  You get a rash or develop hives.  You have a decrease in urine output.  Your urine turns a dark color or changes to pink, red, or brown. Any of the following symptoms occur over the next 10 days:  You have a temperature by mouth above 102 F (38.9 C), not controlled by medicine.  Shortness of breath.  Weakness after normal activity.  The white part of the eye turns yellow (jaundice).  You have a decrease in the amount of urine or are urinating less often.  Your urine turns a dark color or changes to pink, red, or brown. Document Released: 02/18/2000 Document Revised: 05/15/2011 Document Reviewed: 10/07/2007 ExitCare Patient Information 2014 [place].  _______________________________________________________________________  Incentive Spirometer  An incentive spirometer is a tool that can help keep your lungs clear and active. This tool measures how well you are filling your lungs with each breath. Taking long deep breaths may help reverse or decrease the chance of developing breathing (pulmonary) problems (especially infection) following:  A long period of time when you are unable to move or be active. BEFORE THE PROCEDURE   If the spirometer includes an indicator to show your best effort, your nurse or respiratory therapist will set it to a desired  goal.  If possible, sit up straight or lean slightly forward. Try not to slouch.  Hold the incentive spirometer in an upright position. INSTRUCTIONS FOR USE  1. Sit on the edge of your bed if possible, or sit up as far as you can in bed or on a chair. 2. Hold the incentive spirometer in an upright position. 3. Breathe out normally. 4. Place the mouthpiece in your mouth and seal your lips tightly around it. 5. Breathe in slowly and as deeply as possible, raising the piston or the ball toward the top of the column. 6. Hold your breath for 3-5 seconds or for as long as possible. Allow the piston or ball to fall to the bottom of the column. 7. Remove the mouthpiece from your mouth and breathe out normally. 8. Rest for a few seconds and repeat Steps 1 through 7 at least 10 times every 1-2 hours when you are awake. Take your time and take a few normal breaths between deep breaths. 9. The spirometer may include an indicator to show your best effort. Use the indicator as a goal to work toward during each repetition. 10. After each set of 10 deep breaths, practice coughing to be sure your lungs are clear. If you have an incision (the cut made at the time of surgery), support your incision when coughing by placing a pillow or rolled up towels firmly against it. Once you are able to get out of bed, walk around indoors and cough well. You may stop using the incentive spirometer when instructed by your caregiver.  RISKS AND COMPLICATIONS  Take your time so you do not get dizzy or light-headed.  If you are in pain, you may need to take or ask for pain medication before doing incentive spirometry. It is harder to take a deep breath if you are having pain. AFTER USE  Rest and breathe slowly and easily.  It can be helpful to keep track of  a log of your progress. Your caregiver can provide you with a simple table to help with this. If you are using the spirometer at home, follow these instructions: SEEK  MEDICAL CARE IF:   You are having difficultly using the spirometer.  You have trouble using the spirometer as often as instructed.  Your pain medication is not giving enough relief while using the spirometer.  You develop fever of 100.5 F (38.1 C) or higher. SEEK IMMEDIATE MEDICAL CARE IF:   You cough up bloody sputum that had not been present before.  You develop fever of 102 F (38.9 C) or greater.  You develop worsening pain at or near the incision site. MAKE SURE YOU:   Understand these instructions.  Will watch your condition.  Will get help right away if you are not doing well or get worse. Document Released: 07/03/2006 Document Revised: 05/15/2011 Document Reviewed: 09/03/2006 [facility] Patient Information 2014 [place], [place].   ________________________________________________________________________

## 2013-11-27 ENCOUNTER — Encounter (HOSPITAL_COMMUNITY): Payer: Self-pay

## 2013-11-27 ENCOUNTER — Ambulatory Visit (HOSPITAL_COMMUNITY)
Admission: RE | Admit: 2013-11-27 | Discharge: 2013-11-27 | Disposition: A | Discharge status: Home / Self Care | Payer: Medicare PPO | Source: Ambulatory Visit | Attending: Surgical | Admitting: Surgical

## 2013-11-27 ENCOUNTER — Encounter (HOSPITAL_COMMUNITY)
Admission: RE | Admit: 2013-11-27 | Discharge: 2013-11-27 | Disposition: A | Discharge status: Home / Self Care | Payer: Medicare PPO | Source: Ambulatory Visit | Attending: Orthopedic Surgery | Admitting: Orthopedic Surgery

## 2013-11-27 DIAGNOSIS — Z01818 Encounter for other preprocedural examination: Secondary | ICD-10-CM | POA: Insufficient documentation

## 2013-11-27 HISTORY — DX: Tremor, unspecified: R25.1

## 2013-11-27 HISTORY — DX: Gastro-esophageal reflux disease without esophagitis: K21.9

## 2013-11-27 HISTORY — DX: Unspecified osteoarthritis, unspecified site: M19.90

## 2013-11-27 HISTORY — DX: Acute myocardial infarction, unspecified: I21.9

## 2013-11-27 HISTORY — DX: Unspecified glaucoma: H40.9

## 2013-11-27 LAB — URINALYSIS, ROUTINE W REFLEX MICROSCOPIC
Bilirubin Urine: NEGATIVE
Glucose, UA: NEGATIVE mg/dL
Hgb urine dipstick: NEGATIVE
Ketones, ur: NEGATIVE mg/dL
Leukocytes, UA: NEGATIVE
Nitrite: NEGATIVE
Protein, ur: NEGATIVE mg/dL
Specific Gravity, Urine: 1.023 (ref 1.005–1.030)
Urobilinogen, UA: 0.2 mg/dL (ref 0.0–1.0)
pH: 5 (ref 5.0–8.0)

## 2013-11-27 LAB — CBC
HEMATOCRIT: 45.4 % (ref 39.0–52.0)
Hemoglobin: 15 g/dL (ref 13.0–17.0)
MCH: 30.5 pg (ref 26.0–34.0)
MCHC: 33 g/dL (ref 30.0–36.0)
MCV: 92.5 fL (ref 78.0–100.0)
Platelets: 170 10*3/uL (ref 150–400)
RBC: 4.91 MIL/uL (ref 4.22–5.81)
RDW: 13.4 % (ref 11.5–15.5)
WBC: 7.8 10*3/uL (ref 4.0–10.5)

## 2013-11-27 LAB — COMPREHENSIVE METABOLIC PANEL
ALT: 24 U/L (ref 0–53)
AST: 23 U/L (ref 0–37)
Albumin: 4.3 g/dL (ref 3.5–5.2)
Alkaline Phosphatase: 75 U/L (ref 39–117)
Anion gap: 13 (ref 5–15)
BUN: 22 mg/dL (ref 6–23)
CO2: 26 mEq/L (ref 19–32)
Calcium: 9.6 mg/dL (ref 8.4–10.5)
Chloride: 101 mEq/L (ref 96–112)
Creatinine, Ser: 1.02 mg/dL (ref 0.50–1.35)
GFR calc Af Amer: 83 mL/min — ABNORMAL LOW (ref >90)
GFR calc non Af Amer: 72 mL/min — ABNORMAL LOW (ref >90)
Glucose, Bld: 134 mg/dL — ABNORMAL HIGH (ref 70–99)
Potassium: 5.2 mEq/L (ref 3.7–5.3)
Sodium: 140 mEq/L (ref 137–147)
Total Bilirubin: 0.6 mg/dL (ref 0.3–1.2)
Total Protein: 7 g/dL (ref 6.0–8.3)

## 2013-11-27 LAB — APTT: aPTT: 30 seconds (ref 24–37)

## 2013-11-27 LAB — SURGICAL PCR SCREEN
MRSA, PCR: NEGATIVE
Staphylococcus aureus: NEGATIVE

## 2013-11-27 LAB — PROTIME-INR
INR: 0.99 (ref 0.00–1.49)
Prothrombin Time: 13.1 seconds (ref 11.6–15.2)

## 2013-11-27 NOTE — Progress Notes (Signed)
11/27/13 0934  OBSTRUCTIVE SLEEP APNEA  Have you ever been diagnosed with sleep apnea through a sleep study? No  Do you snore loudly (loud enough to be heard through closed doors)?  0  Do you often feel tired, fatigued, or sleepy during the daytime? 0  Has anyone observed you stop breathing during your sleep? 0  Do you have, or are you being treated for high blood pressure? 1  BMI more than 35 kg/m2? 1  Age over 71 years old? 1  Neck circumference greater than 40 cm/16 inches? 1  Gender: 1  Obstructive Sleep Apnea Score 5  Score 4 or greater  Results sent to PCP

## 2013-11-27 NOTE — Progress Notes (Signed)
Surgery clearance note 11/17/13 Marin Comment, FNP on chart, EKG 11/18/13 on EPIC

## 2013-12-03 ENCOUNTER — Encounter (HOSPITAL_COMMUNITY)
Admission: RE | Disposition: A | Discharge status: Skilled Nursing Facility | Payer: Self-pay | Source: Ambulatory Visit | Attending: Orthopedic Surgery

## 2013-12-03 ENCOUNTER — Encounter (HOSPITAL_COMMUNITY): Payer: Self-pay | Admitting: *Deleted

## 2013-12-03 ENCOUNTER — Encounter (HOSPITAL_COMMUNITY): Payer: Medicare PPO | Admitting: Anesthesiology

## 2013-12-03 ENCOUNTER — Inpatient Hospital Stay (HOSPITAL_COMMUNITY)
Admission: RE | Admit: 2013-12-03 | Discharge: 2013-12-05 | DRG: 470 | Disposition: A | Discharge status: Skilled Nursing Facility | Payer: Medicare PPO | Source: Ambulatory Visit | Attending: Orthopedic Surgery | Admitting: Orthopedic Surgery

## 2013-12-03 ENCOUNTER — Inpatient Hospital Stay (HOSPITAL_COMMUNITY): Payer: Medicare PPO | Admitting: Anesthesiology

## 2013-12-03 DIAGNOSIS — Z87891 Personal history of nicotine dependence: Secondary | ICD-10-CM

## 2013-12-03 DIAGNOSIS — Z7982 Long term (current) use of aspirin: Secondary | ICD-10-CM

## 2013-12-03 DIAGNOSIS — I10 Essential (primary) hypertension: Secondary | ICD-10-CM | POA: Diagnosis present

## 2013-12-03 DIAGNOSIS — M179 Osteoarthritis of knee, unspecified: Secondary | ICD-10-CM | POA: Diagnosis present

## 2013-12-03 DIAGNOSIS — Z79899 Other long term (current) drug therapy: Secondary | ICD-10-CM

## 2013-12-03 DIAGNOSIS — M24562 Contracture, left knee: Secondary | ICD-10-CM | POA: Diagnosis present

## 2013-12-03 DIAGNOSIS — I2510 Atherosclerotic heart disease of native coronary artery without angina pectoris: Secondary | ICD-10-CM | POA: Diagnosis present

## 2013-12-03 DIAGNOSIS — Z96659 Presence of unspecified artificial knee joint: Secondary | ICD-10-CM

## 2013-12-03 DIAGNOSIS — I252 Old myocardial infarction: Secondary | ICD-10-CM | POA: Diagnosis not present

## 2013-12-03 DIAGNOSIS — H409 Unspecified glaucoma: Secondary | ICD-10-CM | POA: Diagnosis present

## 2013-12-03 DIAGNOSIS — M25562 Pain in left knee: Secondary | ICD-10-CM | POA: Diagnosis present

## 2013-12-03 DIAGNOSIS — Z955 Presence of coronary angioplasty implant and graft: Secondary | ICD-10-CM

## 2013-12-03 DIAGNOSIS — Z6836 Body mass index (BMI) 36.0-36.9, adult: Secondary | ICD-10-CM | POA: Diagnosis not present

## 2013-12-03 DIAGNOSIS — M1712 Unilateral primary osteoarthritis, left knee: Secondary | ICD-10-CM | POA: Diagnosis present

## 2013-12-03 DIAGNOSIS — Z96652 Presence of left artificial knee joint: Secondary | ICD-10-CM

## 2013-12-03 DIAGNOSIS — E785 Hyperlipidemia, unspecified: Secondary | ICD-10-CM | POA: Diagnosis present

## 2013-12-03 DIAGNOSIS — M1710 Unilateral primary osteoarthritis, unspecified knee: Secondary | ICD-10-CM | POA: Diagnosis not present

## 2013-12-03 HISTORY — PX: TOTAL KNEE ARTHROPLASTY: SHX125

## 2013-12-03 LAB — ABO/RH: ABO/RH(D): A POS

## 2013-12-03 LAB — TYPE AND SCREEN
ABO/RH(D): A POS
Antibody Screen: NEGATIVE

## 2013-12-03 SURGERY — ARTHROPLASTY, KNEE, TOTAL
Anesthesia: General | Site: Knee | Laterality: Left

## 2013-12-03 MED ORDER — SIMVASTATIN 40 MG PO TABS
40.0000 mg | ORAL_TABLET | Freq: Every morning | ORAL | Status: DC
  Administered 2013-12-04 – 2013-12-05 (×2): 40 mg via ORAL
  Filled 2013-12-03 (×2): qty 1

## 2013-12-03 MED ORDER — PROPOFOL 10 MG/ML IV BOLUS
INTRAVENOUS | Status: AC
  Filled 2013-12-03: qty 20

## 2013-12-03 MED ORDER — CEFAZOLIN SODIUM-DEXTROSE 2-3 GM-% IV SOLR
INTRAVENOUS | Status: AC
  Filled 2013-12-03: qty 50

## 2013-12-03 MED ORDER — CEFAZOLIN SODIUM-DEXTROSE 2-3 GM-% IV SOLR
2.0000 g | INTRAVENOUS | Status: AC
  Administered 2013-12-03: 2 g via INTRAVENOUS

## 2013-12-03 MED ORDER — FENTANYL CITRATE 0.05 MG/ML IJ SOLN
INTRAMUSCULAR | Status: AC
  Filled 2013-12-03: qty 2

## 2013-12-03 MED ORDER — SUCCINYLCHOLINE CHLORIDE 20 MG/ML IJ SOLN
INTRAMUSCULAR | Status: DC | PRN
  Administered 2013-12-03: 100 mg via INTRAVENOUS

## 2013-12-03 MED ORDER — PHENOL 1.4 % MT LIQD: 1.0000 | OROMUCOSAL | Status: DC | PRN

## 2013-12-03 MED ORDER — THROMBIN 5000 UNITS EX SOLR
CUTANEOUS | Status: DC | PRN
  Administered 2013-12-03: 5000 [IU] via TOPICAL

## 2013-12-03 MED ORDER — STERILE WATER FOR IRRIGATION IR SOLN
Status: DC | PRN
  Administered 2013-12-03: 1500 mL

## 2013-12-03 MED ORDER — HYDROMORPHONE HCL 1 MG/ML IJ SOLN: 1.0000 mg | INTRAMUSCULAR | Status: DC | PRN

## 2013-12-03 MED ORDER — HYDROMORPHONE HCL 1 MG/ML IJ SOLN
INTRAMUSCULAR | Status: DC | PRN
  Administered 2013-12-03 (×2): 0.5 mg via INTRAVENOUS
  Administered 2013-12-03: 1 mg via INTRAVENOUS

## 2013-12-03 MED ORDER — LISINOPRIL 10 MG PO TABS
10.0000 mg | ORAL_TABLET | Freq: Every morning | ORAL | Status: DC
  Administered 2013-12-03 – 2013-12-05 (×3): 10 mg via ORAL
  Filled 2013-12-03 (×3): qty 1

## 2013-12-03 MED ORDER — LACTATED RINGERS IV SOLN
INTRAVENOUS | Status: DC
  Administered 2013-12-03: 1000 mL via INTRAVENOUS
  Administered 2013-12-03 (×2): via INTRAVENOUS

## 2013-12-03 MED ORDER — ONDANSETRON HCL 4 MG PO TABS: 4.0000 mg | ORAL_TABLET | Freq: Four times a day (QID) | ORAL | Status: DC | PRN

## 2013-12-03 MED ORDER — LIDOCAINE HCL (CARDIAC) 20 MG/ML IV SOLN
INTRAVENOUS | Status: AC
  Filled 2013-12-03: qty 5

## 2013-12-03 MED ORDER — CEFAZOLIN SODIUM 1-5 GM-% IV SOLN
1.0000 g | Freq: Four times a day (QID) | INTRAVENOUS | Status: AC
  Administered 2013-12-03 (×2): 1 g via INTRAVENOUS
  Filled 2013-12-03 (×3): qty 50

## 2013-12-03 MED ORDER — ROCURONIUM BROMIDE 100 MG/10ML IV SOLN
INTRAVENOUS | Status: DC | PRN
  Administered 2013-12-03: 30 mg via INTRAVENOUS

## 2013-12-03 MED ORDER — ACETAMINOPHEN 650 MG RE SUPP: 650.0000 mg | Freq: Four times a day (QID) | RECTAL | Status: DC | PRN

## 2013-12-03 MED ORDER — FLEET ENEMA 7-19 GM/118ML RE ENEM: 1.0000 | ENEMA | Freq: Once | RECTAL | Status: AC | PRN

## 2013-12-03 MED ORDER — METHOCARBAMOL 500 MG PO TABS
500.0000 mg | ORAL_TABLET | Freq: Four times a day (QID) | ORAL | Status: DC | PRN
  Administered 2013-12-03 – 2013-12-05 (×4): 500 mg via ORAL
  Filled 2013-12-03 (×4): qty 1

## 2013-12-03 MED ORDER — MENTHOL 3 MG MT LOZG: 1.0000 | LOZENGE | OROMUCOSAL | Status: DC | PRN

## 2013-12-03 MED ORDER — HYDROCODONE-ACETAMINOPHEN 5-325 MG PO TABS
1.0000 | ORAL_TABLET | ORAL | Status: DC | PRN
  Administered 2013-12-03 – 2013-12-04 (×7): 2 via ORAL
  Administered 2013-12-05: 1 via ORAL
  Administered 2013-12-05: 2 via ORAL
  Filled 2013-12-03 (×9): qty 2

## 2013-12-03 MED ORDER — POLYETHYLENE GLYCOL 3350 17 G PO PACK: 17.0000 g | PACK | Freq: Every day | ORAL | Status: DC | PRN

## 2013-12-03 MED ORDER — FENTANYL CITRATE 0.05 MG/ML IJ SOLN
INTRAMUSCULAR | Status: DC | PRN
  Administered 2013-12-03: 50 ug via INTRAVENOUS
  Administered 2013-12-03: 100 ug via INTRAVENOUS
  Administered 2013-12-03: 50 ug via INTRAVENOUS
  Administered 2013-12-03: 100 ug via INTRAVENOUS
  Administered 2013-12-03: 50 ug via INTRAVENOUS

## 2013-12-03 MED ORDER — METHOCARBAMOL 1000 MG/10ML IJ SOLN
500.0000 mg | Freq: Four times a day (QID) | INTRAVENOUS | Status: DC | PRN
  Filled 2013-12-03: qty 5

## 2013-12-03 MED ORDER — SODIUM CHLORIDE 0.9 % IR SOLN
Status: DC | PRN
  Administered 2013-12-03: 1000 mL

## 2013-12-03 MED ORDER — MEPERIDINE HCL 50 MG/ML IJ SOLN: 6.2500 mg | INTRAMUSCULAR | Status: DC | PRN

## 2013-12-03 MED ORDER — ACETAMINOPHEN 325 MG PO TABS: 650.0000 mg | ORAL_TABLET | Freq: Four times a day (QID) | ORAL | Status: DC | PRN

## 2013-12-03 MED ORDER — SODIUM CHLORIDE 0.9 % IJ SOLN
INTRAMUSCULAR | Status: AC
  Filled 2013-12-03: qty 20

## 2013-12-03 MED ORDER — HYDROMORPHONE HCL 2 MG/ML IJ SOLN
INTRAMUSCULAR | Status: AC
  Filled 2013-12-03: qty 1

## 2013-12-03 MED ORDER — PROPRANOLOL HCL ER 60 MG PO CP24
60.0000 mg | ORAL_CAPSULE | Freq: Every morning | ORAL | Status: DC
  Administered 2013-12-04 – 2013-12-05 (×2): 60 mg via ORAL
  Filled 2013-12-03 (×2): qty 1

## 2013-12-03 MED ORDER — ONDANSETRON HCL 4 MG/2ML IJ SOLN: 4.0000 mg | Freq: Four times a day (QID) | INTRAMUSCULAR | Status: DC | PRN

## 2013-12-03 MED ORDER — FENTANYL CITRATE 0.05 MG/ML IJ SOLN: 25.0000 ug | INTRAMUSCULAR | Status: DC | PRN

## 2013-12-03 MED ORDER — CHLORDIAZEPOXIDE HCL 25 MG PO CAPS
25.0000 mg | ORAL_CAPSULE | Freq: Every morning | ORAL | Status: DC
  Administered 2013-12-04 – 2013-12-05 (×2): 25 mg via ORAL
  Filled 2013-12-03 (×2): qty 1

## 2013-12-03 MED ORDER — DEXAMETHASONE SODIUM PHOSPHATE 10 MG/ML IJ SOLN
INTRAMUSCULAR | Status: DC | PRN
  Administered 2013-12-03: 10 mg via INTRAVENOUS

## 2013-12-03 MED ORDER — LIDOCAINE HCL (CARDIAC) 20 MG/ML IV SOLN
INTRAVENOUS | Status: DC | PRN
  Administered 2013-12-03: 100 mg via INTRAVENOUS

## 2013-12-03 MED ORDER — TRAVOPROST 0.004 % OP SOLN: 1.0000 [drp] | Freq: Every day | OPHTHALMIC | Status: DC

## 2013-12-03 MED ORDER — GLYCOPYRROLATE 0.2 MG/ML IJ SOLN
INTRAMUSCULAR | Status: AC
  Filled 2013-12-03: qty 3

## 2013-12-03 MED ORDER — MIDAZOLAM HCL 2 MG/2ML IJ SOLN
INTRAMUSCULAR | Status: AC
  Filled 2013-12-03: qty 2

## 2013-12-03 MED ORDER — PROMETHAZINE HCL 25 MG/ML IJ SOLN: 6.2500 mg | INTRAMUSCULAR | Status: DC | PRN

## 2013-12-03 MED ORDER — SODIUM CHLORIDE 0.9 % IR SOLN
Status: AC
  Filled 2013-12-03: qty 1

## 2013-12-03 MED ORDER — OXYCODONE-ACETAMINOPHEN 5-325 MG PO TABS: 2.0000 | ORAL_TABLET | ORAL | Status: DC | PRN

## 2013-12-03 MED ORDER — GLYCOPYRROLATE 0.2 MG/ML IJ SOLN
INTRAMUSCULAR | Status: DC | PRN
  Administered 2013-12-03: 0.6 mg via INTRAVENOUS

## 2013-12-03 MED ORDER — ONDANSETRON HCL 4 MG/2ML IJ SOLN
INTRAMUSCULAR | Status: AC
  Filled 2013-12-03: qty 2

## 2013-12-03 MED ORDER — SODIUM CHLORIDE 0.9 % IJ SOLN
INTRAMUSCULAR | Status: DC | PRN
  Administered 2013-12-03: 20 mL

## 2013-12-03 MED ORDER — DEXAMETHASONE SODIUM PHOSPHATE 10 MG/ML IJ SOLN
INTRAMUSCULAR | Status: AC
  Filled 2013-12-03: qty 1

## 2013-12-03 MED ORDER — LACTATED RINGERS IV SOLN
INTRAVENOUS | Status: DC
  Administered 2013-12-03 – 2013-12-05 (×3): via INTRAVENOUS

## 2013-12-03 MED ORDER — FENTANYL CITRATE 0.05 MG/ML IJ SOLN
INTRAMUSCULAR | Status: AC
  Filled 2013-12-03: qty 5

## 2013-12-03 MED ORDER — ALUM & MAG HYDROXIDE-SIMETH 200-200-20 MG/5ML PO SUSP
30.0000 mL | ORAL | Status: DC | PRN
Start: 2013-12-03 — End: 2013-12-05

## 2013-12-03 MED ORDER — BUPIVACAINE LIPOSOME 1.3 % IJ SUSP
20.0000 mL | Freq: Once | INTRAMUSCULAR | Status: DC
  Filled 2013-12-03: qty 20

## 2013-12-03 MED ORDER — LATANOPROST 0.005 % OP SOLN
1.0000 [drp] | Freq: Every day | OPHTHALMIC | Status: DC
  Filled 2013-12-03: qty 2.5

## 2013-12-03 MED ORDER — THROMBIN 5000 UNITS EX SOLR
CUTANEOUS | Status: AC
  Filled 2013-12-03: qty 5000

## 2013-12-03 MED ORDER — SODIUM CHLORIDE 0.9 % IV SOLN
2000.0000 mg | INTRAVENOUS | Status: DC
  Filled 2013-12-03: qty 20

## 2013-12-03 MED ORDER — PROPOFOL 10 MG/ML IV BOLUS
INTRAVENOUS | Status: DC | PRN
  Administered 2013-12-03: 200 mg via INTRAVENOUS

## 2013-12-03 MED ORDER — FERROUS SULFATE 325 (65 FE) MG PO TABS
325.0000 mg | ORAL_TABLET | Freq: Three times a day (TID) | ORAL | Status: DC
  Administered 2013-12-03 – 2013-12-05 (×6): 325 mg via ORAL
  Filled 2013-12-03 (×8): qty 1

## 2013-12-03 MED ORDER — LACTATED RINGERS IV SOLN: INTRAVENOUS | Status: DC

## 2013-12-03 MED ORDER — NEOSTIGMINE METHYLSULFATE 10 MG/10ML IV SOLN
INTRAVENOUS | Status: DC | PRN
  Administered 2013-12-03: 5 mg via INTRAVENOUS

## 2013-12-03 MED ORDER — CELECOXIB 200 MG PO CAPS
200.0000 mg | ORAL_CAPSULE | Freq: Two times a day (BID) | ORAL | Status: DC
  Administered 2013-12-03 – 2013-12-05 (×4): 200 mg via ORAL
  Filled 2013-12-03 (×5): qty 1

## 2013-12-03 MED ORDER — SODIUM CHLORIDE 0.9 % IR SOLN
Status: DC | PRN
  Administered 2013-12-03: 11:00:00

## 2013-12-03 MED ORDER — BISACODYL 5 MG PO TBEC: 5.0000 mg | DELAYED_RELEASE_TABLET | Freq: Every day | ORAL | Status: DC | PRN

## 2013-12-03 MED ORDER — MIDAZOLAM HCL 5 MG/5ML IJ SOLN
INTRAMUSCULAR | Status: DC | PRN
  Administered 2013-12-03: 2 mg via INTRAVENOUS

## 2013-12-03 MED ORDER — RIVAROXABAN 10 MG PO TABS
10.0000 mg | ORAL_TABLET | Freq: Every day | ORAL | Status: DC
  Administered 2013-12-04 – 2013-12-05 (×2): 10 mg via ORAL
  Filled 2013-12-03 (×3): qty 1

## 2013-12-03 MED ORDER — ONDANSETRON HCL 4 MG/2ML IJ SOLN
INTRAMUSCULAR | Status: DC | PRN
  Administered 2013-12-03: 4 mg via INTRAVENOUS

## 2013-12-03 SURGICAL SUPPLY — 71 items
BAG ZIPLOCK 12X15 (MISCELLANEOUS) IMPLANT
BANDAGE ELASTIC 4 VELCRO ST LF (GAUZE/BANDAGES/DRESSINGS) ×3 IMPLANT
BANDAGE ELASTIC 6 VELCRO ST LF (GAUZE/BANDAGES/DRESSINGS) ×3 IMPLANT
BANDAGE ESMARK 6X9 LF (GAUZE/BANDAGES/DRESSINGS) ×1 IMPLANT
BLADE SAG 18X100X1.27 (BLADE) ×3 IMPLANT
BLADE SAW SGTL 11.0X1.19X90.0M (BLADE) ×3 IMPLANT
BNDG ESMARK 6X9 LF (GAUZE/BANDAGES/DRESSINGS) ×3
BONE CEMENT GENTAMICIN (Cement) ×6 IMPLANT
CAPT RP KNEE ×3 IMPLANT
CEMENT BONE GENTAMICIN 40 (Cement) ×2 IMPLANT
CLOSURE WOUND 1/2 X4 (GAUZE/BANDAGES/DRESSINGS) ×1
CUFF TOURN SGL QUICK 34 (TOURNIQUET CUFF) ×2
CUFF TRNQT CYL 34X4X40X1 (TOURNIQUET CUFF) ×1 IMPLANT
DERMABOND ADVANCED (GAUZE/BANDAGES/DRESSINGS) ×2
DERMABOND ADVANCED .7 DNX12 (GAUZE/BANDAGES/DRESSINGS) ×1 IMPLANT
DRAPE EXTREMITY TIBURON (DRAPES) ×3 IMPLANT
DRAPE INCISE IOBAN 66X45 STRL (DRAPES) IMPLANT
DRAPE POUCH INSTRU U-SHP 10X18 (DRAPES) ×3 IMPLANT
DRAPE U-SHAPE 47X51 STRL (DRAPES) ×3 IMPLANT
DRSG ADAPTIC 3X8 NADH LF (GAUZE/BANDAGES/DRESSINGS) ×3 IMPLANT
DRSG AQUACEL AG ADV 3.5X10 (GAUZE/BANDAGES/DRESSINGS) ×3 IMPLANT
DRSG KUZMA FLUFF (GAUZE/BANDAGES/DRESSINGS) ×3 IMPLANT
DRSG PAD ABDOMINAL 8X10 ST (GAUZE/BANDAGES/DRESSINGS) IMPLANT
DRSG TEGADERM 4X4.75 (GAUZE/BANDAGES/DRESSINGS) ×3 IMPLANT
DURAPREP 26ML APPLICATOR (WOUND CARE) ×3 IMPLANT
ELECT REM PT RETURN 9FT ADLT (ELECTROSURGICAL) ×3
ELECTRODE REM PT RTRN 9FT ADLT (ELECTROSURGICAL) ×1 IMPLANT
EVACUATOR 1/8 PVC DRAIN (DRAIN) ×3 IMPLANT
FACESHIELD WRAPAROUND (MASK) ×15 IMPLANT
GAUZE SPONGE 2X2 8PLY STRL LF (GAUZE/BANDAGES/DRESSINGS) ×1 IMPLANT
GAUZE SPONGE 4X4 12PLY STRL (GAUZE/BANDAGES/DRESSINGS) ×3 IMPLANT
GLOVE BIOGEL PI IND STRL 6.5 (GLOVE) ×1 IMPLANT
GLOVE BIOGEL PI IND STRL 8 (GLOVE) ×1 IMPLANT
GLOVE BIOGEL PI INDICATOR 6.5 (GLOVE) ×2
GLOVE BIOGEL PI INDICATOR 8 (GLOVE) ×2
GLOVE ECLIPSE 8.0 STRL XLNG CF (GLOVE) ×6 IMPLANT
GLOVE SURG SS PI 6.5 STRL IVOR (GLOVE) ×3 IMPLANT
GOWN STRL REUS W/TWL LRG LVL3 (GOWN DISPOSABLE) ×3 IMPLANT
GOWN STRL REUS W/TWL XL LVL3 (GOWN DISPOSABLE) ×3 IMPLANT
HANDPIECE INTERPULSE COAX TIP (DISPOSABLE) ×2
IMMOBILIZER KNEE 20 (SOFTGOODS) ×6 IMPLANT
IMMOBILIZER KNEE 20 THIGH 36 (SOFTGOODS) ×1 IMPLANT
KIT BASIN OR (CUSTOM PROCEDURE TRAY) ×3 IMPLANT
MANIFOLD NEPTUNE II (INSTRUMENTS) ×3 IMPLANT
NEEDLE HYPO 22GX1.5 SAFETY (NEEDLE) ×3 IMPLANT
NS IRRIG 1000ML POUR BTL (IV SOLUTION) IMPLANT
PACK TOTAL JOINT (CUSTOM PROCEDURE TRAY) ×3 IMPLANT
PADDING CAST COTTON 6X4 STRL (CAST SUPPLIES) IMPLANT
POSITIONER SURGICAL ARM (MISCELLANEOUS) ×3 IMPLANT
SET HNDPC FAN SPRY TIP SCT (DISPOSABLE) ×1 IMPLANT
SET PAD KNEE POSITIONER (MISCELLANEOUS) ×3 IMPLANT
SPONGE GAUZE 2X2 STER 10/PKG (GAUZE/BANDAGES/DRESSINGS) ×2
SPONGE LAP 18X18 X RAY DECT (DISPOSABLE) IMPLANT
SPONGE SURGIFOAM ABS GEL 100 (HEMOSTASIS) ×6 IMPLANT
STAPLER VISISTAT 35W (STAPLE) IMPLANT
STRIP CLOSURE SKIN 1/2X4 (GAUZE/BANDAGES/DRESSINGS) ×2 IMPLANT
SUCTION FRAZIER 12FR DISP (SUCTIONS) ×3 IMPLANT
SUT BONE WAX W31G (SUTURE) ×3 IMPLANT
SUT MNCRL AB 4-0 PS2 18 (SUTURE) ×3 IMPLANT
SUT VIC AB 1 CT1 27 (SUTURE) ×4
SUT VIC AB 1 CT1 27XBRD ANTBC (SUTURE) ×2 IMPLANT
SUT VIC AB 2-0 CT1 27 (SUTURE) ×6
SUT VIC AB 2-0 CT1 TAPERPNT 27 (SUTURE) ×3 IMPLANT
SUT VLOC 180 0 24IN GS25 (SUTURE) ×3 IMPLANT
SYR 20CC LL (SYRINGE) ×3 IMPLANT
TOWEL OR 17X26 10 PK STRL BLUE (TOWEL DISPOSABLE) ×3 IMPLANT
TOWEL OR NON WOVEN STRL DISP B (DISPOSABLE) IMPLANT
TOWER CARTRIDGE SMART MIX (DISPOSABLE) ×3 IMPLANT
TRAY FOLEY CATH 14FRSI W/METER (CATHETERS) ×3 IMPLANT
WATER STERILE IRR 1500ML POUR (IV SOLUTION) ×3 IMPLANT
WRAP KNEE MAXI GEL POST OP (GAUZE/BANDAGES/DRESSINGS) ×3 IMPLANT

## 2013-12-03 NOTE — Brief Op Note (Signed)
   12:27 PM  PATIENT:  [name] [name]  73 y.o. male  PRE-OPERATIVE DIAGNOSIS:  LEFT KNEES OSTEOARTHRITIS,VERY COMPLEX with Severe Flexion Contracture  POST-OPERATIVE DIAGNOSIS: Same as Pre-Op  PROCEDURE:  Procedure(s): LEFT TOTAL KNEE ARTHROPLASTY (Left)  SURGEON:  Surgeon(s) and Role:    * Jacki Conesonald A Ericberto Padget, MD - Primary  PHYSICIAN ASSISTANT:Amber [place] PA   ASSISTANTS:Amber [place] PA  ANESTHESIA:   general  EBL:  Total I/O In: 1000 [I.V.:1000] Out: 200 [Urine:200]  BLOOD ADMINISTERED:none  DRAINS: (one) Hemovact drain(s) in the Left Knee with  Suction Open   LOCAL MEDICATIONS USED:  BUPIVICAINE 20cc mixed with 20cc of Normal Saline  SPECIMEN:  No Specimen  DISPOSITION OF SPECIMEN:  N/A  COUNTS:  YES  TOURNIQUET:  * Missing tourniquet times found for documented tourniquets in log:  [id number] *  DICTATION: .Other Dictation: Dictation Number [id number]  PLAN OF CARE: Admit to inpatient   PATIENT DISPOSITION:  Stable in OR   Delay start of Pharmacological VTE agent (>24hrs) due to surgical blood loss or risk of bleeding: yes

## 2013-12-03 NOTE — Progress Notes (Signed)
Utilization review completed.  

## 2013-12-03 NOTE — Progress Notes (Signed)
Clinical Social Work Department BRIEF PSYCHOSOCIAL ASSESSMENT 12/03/2013  Patient:  [name] [name],[name] [name]     Account Number:  [id number]     Admit date:  12/03/2013  Clinical Social Worker:  Lacie Scotts  Date/Time:  12/03/2013 05:00 PM  Referred by:  Physician  Date Referred:  12/03/2013 Referred for  SNF Placement   Other Referral:   Interview type:  Patient Other interview type:    PSYCHOSOCIAL DATA Living Status:  ALONE Admitted from facility:   Level of care:   Primary support name:  [name] [name] Primary support relationship to patient:  SIBLING Degree of support available:   supportive    CURRENT CONCERNS Current Concerns  Post-Acute Placement   Other Concerns:    SOCIAL WORK ASSESSMENT / PLAN Pt is a 73 yr old gentleman living at home prior to hospitalization. CSW met with pt / family to assist with d/c planning. This is a planned admission. Pt was hoping to have ST Rehab at [facility] following hospital d/c. CSW contacted SNF and discovered SNF does not accept [employer]. Pt then requested CSW to contact [facility] . Clinicals have been sent to [facility] and a decision is pending. CSW will continue to follow to assist with d/c planning needs.   Assessment/plan status:  Psychosocial Support/Ongoing Assessment of Needs Other assessment/ plan:   Information/referral to community resources:   Insurance coverage for SNF and ambulance transport reviewed.    PATIENT'S/FAMILY'S RESPONSE TO PLAN OF CARE: Pt is disappointed that [employer] is not in network with his insurance. Family lives close by to [facility] and pt is hopeful SNF will have a pvt room for him at D/C.    Werner Lean LCSW [phone]

## 2013-12-03 NOTE — Evaluation (Signed)
Physical Therapy Evaluation Patient Details Name: [name] [name] MRN: [id number] DOB: 1940-05-30 Today's Date: 12/03/2013   History of Present Illness     Clinical Impression  Pt s/p L TKR presents with decreased L LE strength/ROM and post op pain limiting functional mobility.  Pt would benefit from follow up rehab at SNF level to maximize IND and safety prior to return home alone.    Follow Up Recommendations SNF    Equipment Recommendations  None recommended by PT    Recommendations for Other Services OT consult     Precautions / Restrictions Precautions Precautions: Knee;Fall Required Braces or Orthoses: Knee Immobilizer - Left Knee Immobilizer - Left: Discontinue once straight leg raise with < 10 degree lag Restrictions Weight Bearing Restrictions: No Other Position/Activity Restrictions: WBAT      Mobility  Bed Mobility Overal bed mobility: Needs Assistance Bed Mobility: Supine to Sit     Supine to sit: Min assist     General bed mobility comments: cues for sequence and use of R LE to self assist  Transfers Overall transfer level: Needs assistance Equipment used: Rolling walker (2 wheeled) Transfers: Sit to/from Stand Sit to Stand: From elevated surface;Min assist;Mod assist         General transfer comment: cues for LE management and use of UEs to self assist  Ambulation/Gait Ambulation/Gait assistance: Min assist Ambulation Distance (Feet): 36 Feet Assistive device: Rolling walker (2 wheeled) Gait Pattern/deviations: Step-to pattern;Decreased step length - right;Decreased step length - left;Shuffle;Trunk flexed Gait velocity: decr   General Gait Details: cues for sequence, posture and position from [employer]  Stairs            Wheelchair Mobility    Modified Rankin (Stroke Patients Only)       Balance                                             Pertinent Vitals/Pain Pain Assessment: 0-10 Pain Score: 4  Pain Location: L  knee Pain Descriptors / Indicators: Aching;Sore Pain Intervention(s): Limited activity within patient's tolerance;Monitored during session;Premedicated before session;Ice applied    Home Living Family/patient expects to be discharged to:: Skilled nursing facility Living Arrangements: Alone                    Prior Function Level of Independence: Independent               Hand Dominance        Extremity/Trunk Assessment   Upper Extremity Assessment: Overall WFL for tasks assessed           Lower Extremity Assessment: RLE deficits/detail;LLE deficits/detail RLE Deficits / Details: Strength WFL with AROM ltd -10 - 60 LLE Deficits / Details: 2+/5 quads  Cervical / Trunk Assessment: Normal  Communication   Communication: No difficulties  Cognition Arousal/Alertness: Awake/alert Behavior During Therapy: WFL for tasks assessed/performed Overall Cognitive Status: Within Functional Limits for tasks assessed                      General Comments      Exercises        Assessment/Plan    PT Assessment Patient needs continued PT services  PT Diagnosis Difficulty walking   PT Problem List Decreased strength;Decreased range of motion;Decreased activity tolerance;Decreased mobility;Decreased knowledge of use of DME;Obesity;Pain  PT Treatment Interventions DME instruction;Gait training;Functional mobility  training;Therapeutic activities;Therapeutic exercise;Patient/family education   PT Goals (Current goals can be found in the Care Plan section) Acute Rehab PT Goals Patient Stated Goal: Rehab and home to resume previous lifestyle with decreased pain PT Goal Formulation: With patient Time For Goal Achievement: 12/10/13 Potential to Achieve Goals: Good    Frequency 7X/week   Barriers to discharge        Co-evaluation               End of Session Equipment Utilized During Treatment: Gait belt;Left knee immobilizer Activity Tolerance: Patient  tolerated treatment well Patient left: Other (comment) [name] [name]) Nurse Communication: Mobility status         Time: 1610-9604 PT Time Calculation (min): 23 min   Charges:   PT Evaluation $Initial PT Evaluation Tier I: 1 Procedure PT Treatments $Gait Training: 8-22 mins   PT G Codes:          [name] [name] 12/03/2013, 6:38 PM

## 2013-12-03 NOTE — Transfer of Care (Signed)
Immediate Anesthesia Transfer of Care Note  Patient: [name] [name]  Procedure(s) Performed: Procedure(s) (LRB): LEFT TOTAL KNEE ARTHROPLASTY (Left)  Patient Location: PACU  Anesthesia Type: General  Level of Consciousness: sedated, patient cooperative and responds to stimulation  Airway & Oxygen Therapy: Patient Spontanous Breathing and Patient connected to face mask oxgen  Post-op Assessment: Report given to PACU RN and Post -op Vital signs reviewed and stable  Post vital signs: Reviewed and stable  Complications: No apparent anesthesia complications

## 2013-12-03 NOTE — Progress Notes (Signed)
Clinical Social Work Department CLINICAL SOCIAL WORK PLACEMENT NOTE 12/03/2013  Patient:  [name] [name],[name] [name]  Account Number:  [id number] Admit date:  12/03/2013  Clinical Social Worker:  Cori RazorJAMIE Daniil Labarge, LCSW  Date/time:  12/03/2013 05:06 PM  Clinical Social Work is seeking post-discharge placement for this patient at the following level of care:   SKILLED NURSING   (*CSW will update this form in Epic as items are completed)   12/03/2013  Patient/family provided with [facility]'s list of facilities offering this level of care within the geographic area requested by the patient (or if unable, by the patient's family).  12/03/2013  Patient/family informed of their freedom to choose among providers that offer the needed level of care, that participate in Medicare, Medicaid or managed care program needed by the patient, have an available bed and are willing to accept the patient.    Patient/family informed of [facility]' ownership interest in [facility], as well as of the fact that they are under no obligation to receive care at this facility.  PASARR submitted to EDS on 12/03/2013 PASARR number received on 12/03/2013  FL2 transmitted to all facilities in geographic area requested by pt/family on  12/03/2013 FL2 transmitted to all facilities within larger geographic area on   Patient informed that his/her managed care company has contracts with or will negotiate with  certain facilities, including the following:     Patient/family informed of bed offers received:   Patient chooses bed at  Physician recommends and patient chooses bed at    Patient to be transferred to  on   Patient to be transferred to facility by  Patient and family notified of transfer on  Name of family member notified:    The following physician request were entered in Epic:   Additional Comments:  Cori RazorJamie Ivorie Uplinger LCSW [phone]

## 2013-12-03 NOTE — Anesthesia Postprocedure Evaluation (Signed)
  Anesthesia Post-op Note  Patient: [name] PlumberDavid [name]  Procedure(s) Performed: Procedure(s) (LRB): LEFT TOTAL KNEE ARTHROPLASTY (Left)  Patient Location: PACU  Anesthesia Type: General  Level of Consciousness: awake and alert   Airway and Oxygen Therapy: Patient Spontanous Breathing  Post-op Pain: mild  Post-op Assessment: Post-op Vital signs reviewed, Patient's Cardiovascular Status Stable, Respiratory Function Stable, Patent Airway and No signs of Nausea or vomiting  Last Vitals:  Filed Vitals:   12/03/13 1315  BP: 141/49  Pulse: 92  Temp:   Resp: 8    Post-op Vital Signs: stable   Complications: No apparent anesthesia complications

## 2013-12-03 NOTE — Anesthesia Preprocedure Evaluation (Addendum)
Anesthesia Evaluation  Patient identified by MRN, date of birth, ID band Patient awake    Reviewed: Allergy & Precautions, H&P , NPO status , Patient's Chart, lab work & pertinent test results  History of Anesthesia Complications Negative for: history of anesthetic complications  Airway Mallampati: II TM Distance: >3 FB Neck ROM: Full    Dental no notable dental hx. (+) Edentulous Upper, Edentulous Lower   Pulmonary former smoker,  breath sounds clear to auscultation  Pulmonary exam normal       Cardiovascular Exercise Tolerance: Good hypertension, Pt. on medications and Pt. on home beta blockers + CAD, + Past MI and + Cardiac Stents Rhythm:Regular Rate:Normal  NSTEMI s/p taxus DES in 2007 to diag branch of LAD, also has RCA narrowing that is medically managed, recently saw cardiologists and stable with no changes to regimen Took beta blocker this am Off ASA for 7 days   Neuro/Psych negative neurological ROS  negative psych ROS   GI/Hepatic Neg liver ROS, GERD-  Medicated and Controlled,  Endo/Other  negative endocrine ROS  Renal/GU negative Renal ROS  negative genitourinary   Musculoskeletal  (+) Arthritis -, Osteoarthritis,    Abdominal   Peds negative pediatric ROS (+)  Hematology negative hematology ROS (+)   Anesthesia Other Findings   Reproductive/Obstetrics negative OB ROS                          Anesthesia Physical Anesthesia Plan  ASA: III  Anesthesia Plan: General   Post-op Pain Management:    Induction: Intravenous  Airway Management Planned: Oral ETT  Additional Equipment:   Intra-op Plan:   Post-operative Plan: Extubation in OR  Informed Consent: I have reviewed the patients History and Physical, chart, labs and discussed the procedure including the risks, benefits and alternatives for the proposed anesthesia with the patient or authorized representative who has  indicated his/her understanding and acceptance.   Dental advisory given  Plan Discussed with: CRNA  Anesthesia Plan Comments:        Anesthesia Quick Evaluation

## 2013-12-03 NOTE — Interval H&P Note (Signed)
History and Physical Interval Note:  12/03/2013 9:57 AM  [name] Plumberavid Kohli  has presented today for surgery, with the diagnosis of LEFT KNEES OSTEOARTHRITIS  The various methods of treatment have been discussed with the patient and family. After consideration of risks, benefits and other options for treatment, the patient has consented to  Procedure(s): LEFT TOTAL KNEE ARTHROPLASTY (Left) as a surgical intervention .  The patient's history has been reviewed, patient examined, no change in status, stable for surgery.  I have reviewed the patient's chart and labs.  Questions were answered to the patient's satisfaction.     [name] [name] A

## 2013-12-04 LAB — CBC
HEMATOCRIT: 37.1 % — AB (ref 39.0–52.0)
Hemoglobin: 12.3 g/dL — ABNORMAL LOW (ref 13.0–17.0)
MCH: 30.8 pg (ref 26.0–34.0)
MCHC: 33.2 g/dL (ref 30.0–36.0)
MCV: 93 fL (ref 78.0–100.0)
Platelets: 135 10*3/uL — ABNORMAL LOW (ref 150–400)
RBC: 3.99 MIL/uL — ABNORMAL LOW (ref 4.22–5.81)
RDW: 13.4 % (ref 11.5–15.5)
WBC: 14.2 10*3/uL — ABNORMAL HIGH (ref 4.0–10.5)

## 2013-12-04 LAB — BASIC METABOLIC PANEL
Anion gap: 13 (ref 5–15)
BUN: 16 mg/dL (ref 6–23)
CHLORIDE: 101 meq/L (ref 96–112)
CO2: 27 mEq/L (ref 19–32)
Calcium: 9 mg/dL (ref 8.4–10.5)
Creatinine, Ser: 1.02 mg/dL (ref 0.50–1.35)
GFR calc Af Amer: 83 mL/min — ABNORMAL LOW (ref >90)
GFR calc non Af Amer: 72 mL/min — ABNORMAL LOW (ref >90)
GLUCOSE: 187 mg/dL — AB (ref 70–99)
Potassium: 4.8 mEq/L (ref 3.7–5.3)
Sodium: 141 mEq/L (ref 137–147)

## 2013-12-04 NOTE — Progress Notes (Signed)
Physical Therapy Treatment Patient Details Name: Edmar Blankenburg MRN: [id number] DOB: 12/30/1940 Today's Date: 12/04/2013    History of Present Illness L TKR    PT Comments    Very motivated and progressing well  Follow Up Recommendations  SNF     Equipment Recommendations  None recommended by PT    Recommendations for Other Services OT consult     Precautions / Restrictions Precautions Precautions: Knee;Fall Required Braces or Orthoses: Knee Immobilizer - Left Knee Immobilizer - Left: Discontinue once straight leg raise with < 10 degree lag Restrictions Weight Bearing Restrictions: No Other Position/Activity Restrictions: WBAT    Mobility  Bed Mobility Overal bed mobility: Needs Assistance Bed Mobility: Supine to Sit     Supine to sit: Min assist     General bed mobility comments: cues for sequence and use of R LE to self assist  Transfers Overall transfer level: Needs assistance Equipment used: Rolling walker (2 wheeled) Transfers: Sit to/from Stand Sit to Stand: From elevated surface;Min assist         General transfer comment: cues for LE management and use of UEs to self assist  Ambulation/Gait Ambulation/Gait assistance: Min assist Ambulation Distance (Feet): 68 Feet Assistive device: Rolling walker (2 wheeled) Gait Pattern/deviations: Step-to pattern;Shuffle;Trunk flexed     General Gait Details: cues for sequence, posture and position from [employer]            Wheelchair Mobility    Modified Rankin (Stroke Patients Only)       Balance                                    Cognition Arousal/Alertness: Awake/alert Behavior During Therapy: WFL for tasks assessed/performed Overall Cognitive Status: Within Functional Limits for tasks assessed                      Exercises Total Joint Exercises Ankle Circles/Pumps: AROM;Both;15 reps;Supine Quad Sets: AROM;10 reps;Supine;Both Heel Slides: AAROM;Left;15  reps;Supine Straight Leg Raises: AAROM;Left;10 reps;Supine Goniometric ROM: AAROM at L knee -15 - 50    General Comments        Pertinent Vitals/Pain Pain Assessment: 0-10 Pain Score: 4  Pain Location: L knee Pain Descriptors / Indicators: Aching;Sore Pain Intervention(s): Limited activity within patient's tolerance;Monitored during session;Premedicated before session;Ice applied    Home Living                      Prior Function            PT Goals (current goals can now be found in the care plan section) Acute Rehab PT Goals Patient Stated Goal: Rehab and home to resume previous lifestyle with decreased pain PT Goal Formulation: With patient Time For Goal Achievement: 12/10/13 Potential to Achieve Goals: Good Progress towards PT goals: Progressing toward goals    Frequency  7X/week    PT Plan Current plan remains appropriate    Co-evaluation             End of Session Equipment Utilized During Treatment: Gait belt;Left knee immobilizer Activity Tolerance: Patient tolerated treatment well Patient left: in chair;with call bell/phone within reach     Time: 1030-1105 PT Time Calculation (min): 35 min  Charges:  $Gait Training: 8-22 mins $Therapeutic Exercise: 8-22 mins  G Codes:      Brianna Bennett 12/04/2013, 11:39 AM

## 2013-12-04 NOTE — Discharge Instructions (Addendum)
Information on my medicine - XARELTO (Rivaroxaban)  This medication education was reviewed with me or my healthcare representative as part of my discharge preparation.  The pharmacist that spoke with me during my hospital stay was:  Maurice MarchJackson, Rachel E, [facility]  Why was Xarelto prescribed for you? Xarelto was prescribed for you to reduce the risk of blood clots forming after orthopedic surgery. The medical term for these abnormal blood clots is venous thromboembolism (VTE).  What do you need to know about xarelto ? Take your Xarelto ONCE DAILY at the same time every day. You may take it either with or without food.  If you have difficulty swallowing the tablet whole, you may crush it and mix in applesauce just prior to taking your dose.  Take Xarelto exactly as prescribed by your doctor and DO NOT stop taking Xarelto without talking to the doctor who prescribed the medication.  Stopping without other VTE prevention medication to take the place of Xarelto may increase your risk of developing a clot.  After discharge, you should have regular check-up appointments with your healthcare provider that is prescribing your Xarelto.    What do you do if you miss a dose? If you miss a dose, take it as soon as you remember on the same day then continue your regularly scheduled once daily regimen the next day. Do not take two doses of Xarelto on the same day.   Important Safety Information A possible side effect of Xarelto is bleeding. You should call your healthcare provider right away if you experience any of the following:   Bleeding from an injury or your nose that does not stop.   Unusual colored urine (red or dark brown) or unusual colored stools (red or black).   Unusual bruising for unknown reasons.   A serious fall or if you hit your head (even if there is no bleeding).  Some medicines may interact with Xarelto and might increase your risk of bleeding while on Xarelto. To help avoid  this, consult your healthcare provider or pharmacist prior to using any new prescription or non-prescription medications, including herbals, vitamins, non-steroidal anti-inflammatory drugs (NSAIDs) and supplements.  This website has more information on Xarelto: [email or web address].xarelto.[email or web address].  Walk with your walker. Weight bearing as tolerated Change your dressing daily. Shower only, no tub bath. Call if any temperatures greater than 101 or any wound complications: [phone] during the day and ask for Dr. Jeannetta EllisGioffre's nurse, Mackey Birchwoodammy Johnson.

## 2013-12-04 NOTE — Progress Notes (Signed)
Subjective: 1 Day Post-Op Procedure(s) (LRB): LEFT TOTAL KNEE ARTHROPLASTY (Left) Patient reports pain as 2 on 0-10 scale. Doing well today. Hemovac DCd  Objective: Vital signs in last 24 hours: Temp:  [97.8 F (36.6 C)-98.9 F (37.2 C)] 98.2 F (36.8 C) (10/01 0523) Pulse Rate:  [61-92] 61 (10/01 0523) Resp:  [8-16] 15 (10/01 0523) BP: (102-147)/(49-72) 110/59 mmHg (10/01 0523) SpO2:  [94 %-99 %] 97 % (10/01 0523) Weight:  [108.41 kg (239 lb)] 108.41 kg (239 lb) (09/30 0833)  Intake/Output from previous day: 09/30 0701 - 10/01 0700 In: 4168.3 [P.O.:240; I.V.:3928.3] Out: 1225 [Urine:1150; Drains:75] Intake/Output this shift:     Recent Labs  12/04/13 0430  HGB 12.3*    Recent Labs  12/04/13 0430  WBC 14.2*  RBC 3.99*  HCT 37.1*  PLT 135*    Recent Labs  12/04/13 0430  NA 141  K 4.8  CL 101  CO2 27  BUN 16  CREATININE 1.02  GLUCOSE 187*  CALCIUM 9.0   No results found for this basename: LABPT, INR,  in the last 72 hours  Dorsiflexion/Plantar flexion intact  Assessment/Plan: 1 Day Post-Op Procedure(s) (LRB): LEFT TOTAL KNEE ARTHROPLASTY (Left) Up with therapy  Merric Yost A 12/04/2013, 7:35 AM

## 2013-12-04 NOTE — Op Note (Signed)
NAMJunita Push:  Graca, Udell                ACCOUNT NO.:  [id number]  MEDICAL RECORD NO.:  [id number]  LOCATION:  [id number]                         [facility]  PHYSICIAN:  Georges Lynchonald A. Denzil Bristol, M.D.DATE OF BIRTH:  1941-02-24  DATE OF PROCEDURE:  12/03/2013 DATE OF DISCHARGE:                              OPERATIVE REPORT   SURGEON:  Georges Lynchonald A. Esias Mory, M.D.  ASSISTANT:  Dimitri PedAmber Constable, PA-C  PREOPERATIVE DIAGNOSES: 1. Severe complex osteoarthritis of the left knee. 2. Severe flexion contracture, left knee.  POSTOPERATIVE DIAGNOSES: 1. Severe complex osteoarthritis of the left knee. 2. Severe flexion contracture, left knee.  OPERATION: 1. Release of flexion contractures, left knee. 2. Left total knee arthroplasty utilizing the DePuy system.  We     utilized a size 5 femoral component, posterior cruciate sacrificing     left.  We utilized a size 5 tray, the insert was a size 5 and 15 mm     thickness.  The patella was a size 41 with 3 pegs.  DESCRIPTION OF PROCEDURE:  Under general anesthesia, routine orthopedic prep and draping of the left lower extremity was carried out.  At this time, the appropriate time-out was first carried out.  I also marked the appropriate left leg in the holding area.  The leg was exsanguinated with Esmarch.  Tourniquet was elevated to 325 mmHg.  The leg was placed in a DeMayo knee holder.  At this time, with the knee flexed anterior approach of the left knee was carried out.  Bleeders were identified and cauterized.  I created 2 flaps and then carried out a median parapatellar incision.  We could barely flex the knee, more at about 45 degrees.  We did releases as we went along.  We removed the large osteophytes from the patella and the femoral condyle.  We did medial and lateral meniscectomies and now was able to flex the knee a little more. We then did a nice dissection along the medial border of the tibia in regards of releasing the pes anserinus part way.   We then went back posteriorly and removed the spurs, removed the spurs from the tibia plateau as well medially.  We irrigated out the knee and then made sure we had completely removed the cruciates and the menisci.  We did soft tissue release posterior as well.  We now had good releases.  A drill hole was made in the intercondylar notch in the usual fashion.  The guide rod was inserted.  We then thoroughly irrigated out the femoral canal and removed 14 mm thickness off the distal femur because of the severity of the arthritis and the contractures.  Following that, I then measured the femur to be a size 5.  We did the appropriate cuts were anterior-posterior and chamfering cuts and distal cuts for a size 5 left femur.  Following that, we then prepared the tibia.  The tibia was a size 5 tray.  A drill hole was made in the tibial plateau.  The guide rod was inserted down the tibia.  We thoroughly irrigated out the tibia as well.  We then made our appropriate cuts in the usual fashion.  We then inserted  our lamina spreaders and removed the large posterior spurs as well as we could here in this case.  The nail was very well released. Great care was taken not to injure any underlying neurovascular structures.  We then cut our keel cut out for a size 5 tibial tray.  We then cut our notch cut out of the distal femur.  We inserted our trial components and finally selected a 50 mm thickness rotating platform.  We then removed all trial components, water picked out the knee, and we did do a resurfacing procedure by the way with the components and we resurfaced the patella.  Three drill holes were made in the patella for a size 41.  All trial components were removed.  We water picked out the knee, dried the knee out and inserted some thrombin-soaked Gelfoam posteriorly and then inserted our cement.  Our cement had a gentamicin- type cement.  All 3 components were cemented simultaneously.  Once  the cement was hardened, we removed all loose pieces of cement, water picked out the knee again to try to make sure we had no loose pieces of cement. We then selected the size 5, 50 mm thickness insert.  We had a nice range of motion.  We had good stability as well mediolaterally and good extension and flexion.  We inserted a Hemovac drain and closed the wound in layers in the usual fashion.  Note, he had 2 g IV Ancef preop. Tranexamic acid will be used locally at the end of the procedure as well as Exparel 20 mL mixed with 20 mL of saline.          ______________________________ Georges Lynch. Darrelyn Hillock, M.D.     RAG/MEDQ  D:  12/03/2013  T:  12/04/2013  Job:  [id number]

## 2013-12-04 NOTE — Evaluation (Signed)
Occupational Therapy Evaluation Patient Details Name: [name] [name] MRN: [id number] DOB: Sep 27, 1940 Today's Date: 12/04/2013    History of Present Illness L TKR   Clinical Impression   Pt doing well. Planning SNF. Will follow for acute OT to progress ADL independence including practicing 3in1 transfers.    Follow Up Recommendations  SNF;Supervision/Assistance - 24 hour    Equipment Recommendations  None recommended by OT    Recommendations for Other Services       Precautions / Restrictions Precautions Precautions: Knee;Fall Required Braces or Orthoses: Knee Immobilizer - Left Knee Immobilizer - Left: Discontinue once straight leg raise with < 10 degree lag Restrictions Weight Bearing Restrictions: No Other Position/Activity Restrictions: WBAT      Mobility Bed Mobility              Transfers Overall transfer level: Needs assistance Equipment used: Rolling walker (2 wheeled) Transfers: Sit to/from Stand Sit to Stand: Min assist         General transfer comment: verbal cues for hand placement and LE management.    Balance                                            ADL Overall ADL's : Needs assistance/impaired Eating/Feeding: Independent;Sitting   Grooming: Wash/dry hands;Set up;Sitting   Upper Body Bathing: Set up;Sitting   Lower Body Bathing: Moderate assistance;Sit to/from stand   Upper Body Dressing : Set up;Sitting   Lower Body Dressing: Moderate assistance;Sit to/from stand   Toilet Transfer: Minimal assistance;BSC;Ambulation;RW   Toileting- Clothing Manipulation and Hygiene: Minimal assistance;Sit to/from stand         General ADL Comments: Pt transferred into bathroom with walker to stand and use urinal. Then practiced on and off 3in1. Pt needing verbal cues for safety with hand placement and LE management. He is planning SNF.     Vision                     Perception     Praxis      Pertinent  Vitals/Pain Pain Assessment: 0-10 Pain Score: 5  Pain Location: L knee Pain Descriptors / Indicators: Sore Pain Intervention(s): Other (comment);Repositioned;Ice applied (nursing in room to give meds)     Hand Dominance     Extremity/Trunk Assessment Upper Extremity Assessment Upper Extremity Assessment: Overall WFL for tasks assessed           Communication Communication Communication: No difficulties   Cognition Arousal/Alertness: Awake/alert Behavior During Therapy: WFL for tasks assessed/performed Overall Cognitive Status: Within Functional Limits for tasks assessed                     General Comments          Shoulder Instructions      Home Living Family/patient expects to be discharged to:: Skilled nursing facility Living Arrangements: Alone                                      Prior Functioning/Environment Level of Independence: Independent             OT Diagnosis: Generalized weakness   OT Problem List: Decreased strength;Decreased knowledge of use of DME or AE   OT Treatment/Interventions: Self-care/ADL training;Patient/family education;Therapeutic activities;DME and/or AE instruction    OT  Goals(Current goals can be found in the care plan section) Acute Rehab OT Goals Patient Stated Goal: Rehab and home to resume previous lifestyle with decreased pain OT Goal Formulation: With patient Time For Goal Achievement: 12/11/13 Potential to Achieve Goals: Good  OT Frequency: Min 2X/week   Barriers to D/C:            Co-evaluation              End of Session Equipment Utilized During Treatment: Gait belt;Rolling walker;Left knee immobilizer  Activity Tolerance: Patient tolerated treatment well Patient left: in chair;with call bell/phone within reach   Time: 1140-1207 OT Time Calculation (min): 27 min Charges:  OT General Charges $OT Visit: 1 Procedure OT Evaluation $Initial OT Evaluation Tier I: 1 Procedure OT  Treatments $Self Care/Home Management : 8-22 mins $Therapeutic Activity: 8-22 mins G-Codes:    Lennox LaityStone, Jax Abdelrahman Stafford [phone] 12/04/2013, 12:27 PM

## 2013-12-04 NOTE — Care Management Note (Signed)
    Page 1 of 1   12/04/2013     10:41:40 AM CARE MANAGEMENT NOTE 12/04/2013  Patient:  [name],[name]   Account Number:  [id number]  Date Initiated:  12/04/2013  Documentation initiated by:  [facility] LLCJEFFRIES,Khalila Buechner  Subjective/Objective Assessment:   adm: LEFT TOTAL KNEE ARTHROPLASTY (Left)     Action/Plan:   discharge plan is SNF   Anticipated DC Date:  12/05/2013   Anticipated DC Plan:  SKILLED NURSING FACILITY      DC Planning Services  CM consult      Choice offered to / List presented to:             Status of service:  Completed, signed off Medicare Important Message given?   (If response is "NO", the following Medicare IM given date fields will be blank) Date Medicare IM given:   Medicare IM given by:   Date Additional Medicare IM given:   Additional Medicare IM given by:    Discharge Disposition:  SKILLED NURSING FACILITY  Per UR Regulation:    If discussed at Long Length of Stay Meetings, dates discussed:    Comments:  12/04/13 10:40 CM notes pt will go to SNF for rehab; CSW arranging.  No other CM needs were communicated.  [name] [name], BSN, CM [phone].

## 2013-12-04 NOTE — Progress Notes (Signed)
Physical Therapy Treatment Patient Details Name: [name] PlumberDavid [name] MRN: [id number] DOB: Aug 03, 1940 Today's Date: 12/04/2013    History of Present Illness L TKR    PT Comments      Follow Up Recommendations  SNF     Equipment Recommendations  None recommended by PT    Recommendations for Other Services OT consult     Precautions / Restrictions Precautions Precautions: Knee;Fall Required Braces or Orthoses: Knee Immobilizer - Left Knee Immobilizer - Left: Discontinue once straight leg raise with < 10 degree lag Restrictions Weight Bearing Restrictions: No Other Position/Activity Restrictions: WBAT    Mobility  Bed Mobility Overal bed mobility: Needs Assistance Bed Mobility: Sit to Supine       Sit to supine: Min assist   General bed mobility comments: cues for sequence and use of R LE to self assist  Transfers Overall transfer level: Needs assistance Equipment used: Rolling walker (2 wheeled) Transfers: Sit to/from Stand Sit to Stand: Min assist         General transfer comment: verbal cues for hand placement and LE management.  Ambulation/Gait Ambulation/Gait assistance: Min assist Ambulation Distance (Feet): 88 Feet Assistive device: Rolling walker (2 wheeled) Gait Pattern/deviations: Step-to pattern;Step-through pattern;Decreased step length - right;Decreased step length - left;Shuffle;Trunk flexed Gait velocity: decr   General Gait Details: cues for sequence, posture and position from [employer]   Stairs            Wheelchair Mobility    Modified Rankin (Stroke Patients Only)       Balance                                    Cognition Arousal/Alertness: Awake/alert Behavior During Therapy: WFL for tasks assessed/performed Overall Cognitive Status: Within Functional Limits for tasks assessed                      Exercises Total Joint Exercises Ankle Circles/Pumps: AROM;Both;15 reps;Supine Quad Sets: AROM;Supine;Both;15  reps Heel Slides: AAROM;Left;15 reps;Supine Straight Leg Raises: AAROM;Left;Supine;15 reps Goniometric ROM: AAROM -15 - 50    General Comments        Pertinent Vitals/Pain Pain Assessment: 0-10 Pain Score: 5  Pain Location: L knee Pain Descriptors / Indicators: Aching;Sore Pain Intervention(s): Limited activity within patient's tolerance;Monitored during session;Premedicated before session;Ice applied    Home Living Family/patient expects to be discharged to:: Skilled nursing facility Living Arrangements: Alone                  Prior Function Level of Independence: Independent          PT Goals (current goals can now be found in the care plan section) Acute Rehab PT Goals Patient Stated Goal: Rehab and home to resume previous lifestyle with decreased pain PT Goal Formulation: With patient Time For Goal Achievement: 12/10/13 Potential to Achieve Goals: Good Progress towards PT goals: Progressing toward goals    Frequency  7X/week    PT Plan Current plan remains appropriate    Co-evaluation             End of Session Equipment Utilized During Treatment: Gait belt;Left knee immobilizer Activity Tolerance: Patient tolerated treatment well Patient left: in bed;with call bell/phone within reach     Time: [id number] PT Time Calculation (min): 58 min  Charges:  $Gait Training: 23-37 mins $Therapeutic Exercise: 8-22 mins  G Codes:      [name] [name] 12/31/2013, 3:06 PM

## 2013-12-05 ENCOUNTER — Other Ambulatory Visit: Payer: Self-pay | Admitting: *Deleted

## 2013-12-05 LAB — BASIC METABOLIC PANEL
Anion gap: 11 (ref 5–15)
BUN: 21 mg/dL (ref 6–23)
CALCIUM: 9.1 mg/dL (ref 8.4–10.5)
CO2: 30 meq/L (ref 19–32)
CREATININE: 1.14 mg/dL (ref 0.50–1.35)
Chloride: 99 mEq/L (ref 96–112)
GFR calc Af Amer: 73 mL/min — ABNORMAL LOW (ref >90)
GFR calc non Af Amer: 63 mL/min — ABNORMAL LOW (ref >90)
GLUCOSE: 139 mg/dL — AB (ref 70–99)
Potassium: 4.7 mEq/L (ref 3.7–5.3)
Sodium: 140 mEq/L (ref 137–147)

## 2013-12-05 LAB — CBC
HCT: 35.8 % — ABNORMAL LOW (ref 39.0–52.0)
HEMOGLOBIN: 11.6 g/dL — AB (ref 13.0–17.0)
MCH: 30.8 pg (ref 26.0–34.0)
MCHC: 32.4 g/dL (ref 30.0–36.0)
MCV: 95 fL (ref 78.0–100.0)
Platelets: 143 10*3/uL — ABNORMAL LOW (ref 150–400)
RBC: 3.77 MIL/uL — AB (ref 4.22–5.81)
RDW: 13.9 % (ref 11.5–15.5)
WBC: 11.4 10*3/uL — ABNORMAL HIGH (ref 4.0–10.5)

## 2013-12-05 MED ORDER — CHLORDIAZEPOXIDE HCL 25 MG PO CAPS: ORAL_CAPSULE | ORAL | Status: DC

## 2013-12-05 MED ORDER — METHOCARBAMOL 500 MG PO TABS: 500.0000 mg | ORAL_TABLET | Freq: Four times a day (QID) | ORAL | Status: DC | PRN

## 2013-12-05 MED ORDER — HYDROCODONE-ACETAMINOPHEN 5-325 MG PO TABS: 1.0000 | ORAL_TABLET | ORAL | Status: DC | PRN

## 2013-12-05 MED ORDER — DOCUSATE SODIUM 100 MG PO CAPS: 100.0000 mg | ORAL_CAPSULE | Freq: Two times a day (BID) | ORAL | Status: DC

## 2013-12-05 MED ORDER — RIVAROXABAN 10 MG PO TABS: 10.0000 mg | ORAL_TABLET | Freq: Every day | ORAL | Status: DC

## 2013-12-05 MED ORDER — HYDROCODONE-ACETAMINOPHEN 5-325 MG PO TABS: ORAL_TABLET | ORAL | Status: DC

## 2013-12-05 NOTE — Telephone Encounter (Signed)
[facility] 

## 2013-12-05 NOTE — Discharge Summary (Signed)
. Physician Discharge Summary   Patient ID: [name] [name] MRN: [id number] DOB/AGE: 1941/01/02 73 y.o.  Admit date: 12/03/2013 Discharge date: 12/05/2013  Primary Diagnosis: Osteoarthritis, left knee   Admission Diagnoses:  Past Medical History  Diagnosis Date  . Coronary artery disease     status post non-ST-elevation myocardial   infarction in May 2008 with treatment of a diagonal branch lesion  with a Taxus drug-eluting stent.  . Hyperlipidemia     with low HDL, improved on simvastatin.  Marland Kitchen Hypertension   . Tremors of nervous system   . Myocardial infarction 2008  . GERD (gastroesophageal reflux disease)     rare, mild  . Glaucoma   . Arthritis     knees   Discharge Diagnoses:   Active Problems:   Osteoarthritis of left knee   Total knee replacement status  Estimated body mass index is 36.35 kg/(m^2) as calculated from the following:   Height as of this encounter: 5' 8"  (1.727 m).   Weight as of this encounter: 108.41 kg (239 lb).  Procedure:  Procedure(s) (LRB): LEFT TOTAL KNEE ARTHROPLASTY (Left)   Consults: None  HPI: [name] [name], 73 y.o. male, has a history of pain and functional disability in the left knee due to arthritis and has failed non-surgical conservative treatments for greater than 12 weeks to includeNSAID's and/or analgesics, corticosteriod injections, viscosupplementation injections and activity modification. Onset of symptoms was gradual, starting >10 years ago with gradually worsening course since that time. The patient noted prior procedures on the knee to include arthroscopy and menisectomy on the left knee(s). Patient currently rates pain in the left knee(s) at 8 out of 10 with activity. Patient has night pain, worsening of pain with activity and weight bearing, pain that interferes with activities of daily living, pain with passive range of motion, crepitus and joint swelling. Patient has evidence of periarticular osteophytes and joint space narrowing by  imaging studies.There is no active infection.   Laboratory Data: Admission on 12/03/2013  Component Date Value Ref Range Status  . ABO/RH(D) 12/03/2013 A POS   Final  . Antibody Screen 12/03/2013 NEG   Final  . Sample Expiration 12/03/2013 12/06/2013   Final  . ABO/RH(D) 12/03/2013 A POS   Final  . WBC 12/04/2013 14.2* 4.0 - 10.5 K/uL Final  . RBC 12/04/2013 3.99* 4.22 - 5.81 MIL/uL Final  . Hemoglobin 12/04/2013 12.3* 13.0 - 17.0 g/dL Final  . HCT 12/04/2013 37.1* 39.0 - 52.0 % Final  . MCV 12/04/2013 93.0  78.0 - 100.0 fL Final  . MCH 12/04/2013 30.8  26.0 - 34.0 pg Final  . MCHC 12/04/2013 33.2  30.0 - 36.0 g/dL Final  . RDW 12/04/2013 13.4  11.5 - 15.5 % Final  . Platelets 12/04/2013 135* 150 - 400 K/uL Final  . Sodium 12/04/2013 141  137 - 147 mEq/L Final  . Potassium 12/04/2013 4.8  3.7 - 5.3 mEq/L Final  . Chloride 12/04/2013 101  96 - 112 mEq/L Final  . CO2 12/04/2013 27  19 - 32 mEq/L Final  . Glucose, Bld 12/04/2013 187* 70 - 99 mg/dL Final  . BUN 12/04/2013 16  6 - 23 mg/dL Final  . Creatinine, Ser 12/04/2013 1.02  0.50 - 1.35 mg/dL Final  . Calcium 12/04/2013 9.0  8.4 - 10.5 mg/dL Final  . GFR calc non Af Amer 12/04/2013 72* >90 mL/min Final  . GFR calc Af Amer 12/04/2013 83* >90 mL/min Final   Comment: (NOTE)  The eGFR has been calculated using the CKD EPI equation.                          This calculation has not been validated in all clinical situations.                          eGFR's persistently <90 mL/min signify possible Chronic Kidney                          Disease.  . Anion gap 12/04/2013 13  5 - 15 Final  . WBC 12/05/2013 11.4* 4.0 - 10.5 K/uL Final  . RBC 12/05/2013 3.77* 4.22 - 5.81 MIL/uL Final  . Hemoglobin 12/05/2013 11.6* 13.0 - 17.0 g/dL Final  . HCT 12/05/2013 35.8* 39.0 - 52.0 % Final  . MCV 12/05/2013 95.0  78.0 - 100.0 fL Final  . MCH 12/05/2013 30.8  26.0 - 34.0 pg Final  . MCHC 12/05/2013 32.4  30.0 - 36.0 g/dL  Final  . RDW 12/05/2013 13.9  11.5 - 15.5 % Final  . Platelets 12/05/2013 143* 150 - 400 K/uL Final  . Sodium 12/05/2013 140  137 - 147 mEq/L Final  . Potassium 12/05/2013 4.7  3.7 - 5.3 mEq/L Final   Comment: MODERATE HEMOLYSIS                          HEMOLYSIS AT THIS LEVEL MAY AFFECT RESULT  . Chloride 12/05/2013 99  96 - 112 mEq/L Final  . CO2 12/05/2013 30  19 - 32 mEq/L Final  . Glucose, Bld 12/05/2013 139* 70 - 99 mg/dL Final  . BUN 12/05/2013 21  6 - 23 mg/dL Final  . Creatinine, Ser 12/05/2013 1.14  0.50 - 1.35 mg/dL Final  . Calcium 12/05/2013 9.1  8.4 - 10.5 mg/dL Final  . GFR calc non Af Amer 12/05/2013 63* >90 mL/min Final  . GFR calc Af Amer 12/05/2013 73* >90 mL/min Final   Comment: (NOTE)                          The eGFR has been calculated using the CKD EPI equation.                          This calculation has not been validated in all clinical situations.                          eGFR's persistently <90 mL/min signify possible Chronic Kidney                          Disease.  Georgiann Hahn gap 12/05/2013 11  5 - 15 Final  Hospital Outpatient Visit on 11/27/2013  Component Date Value Ref Range Status  . MRSA, PCR 11/27/2013 NEGATIVE  NEGATIVE Final  . Staphylococcus aureus 11/27/2013 NEGATIVE  NEGATIVE Final   Comment:                                 The Xpert SA Assay (FDA                          approved  for NASAL specimens                          in patients over 9 years of age),                          is one component of                          a comprehensive surveillance                          program.  Test performance has                          been validated by [employer] for patients greater                          than or equal to 62 year old.                          It is not intended                          to diagnose infection nor to                          guide or monitor treatment.  . WBC 11/27/2013 7.8   4.0 - 10.5 K/uL Final  . RBC 11/27/2013 4.91  4.22 - 5.81 MIL/uL Final  . Hemoglobin 11/27/2013 15.0  13.0 - 17.0 g/dL Final  . HCT 11/27/2013 45.4  39.0 - 52.0 % Final  . MCV 11/27/2013 92.5  78.0 - 100.0 fL Final  . MCH 11/27/2013 30.5  26.0 - 34.0 pg Final  . MCHC 11/27/2013 33.0  30.0 - 36.0 g/dL Final  . RDW 11/27/2013 13.4  11.5 - 15.5 % Final  . Platelets 11/27/2013 170  150 - 400 K/uL Final  . aPTT 11/27/2013 30  24 - 37 seconds Final  . Sodium 11/27/2013 140  137 - 147 mEq/L Final  . Potassium 11/27/2013 5.2  3.7 - 5.3 mEq/L Final  . Chloride 11/27/2013 101  96 - 112 mEq/L Final  . CO2 11/27/2013 26  19 - 32 mEq/L Final  . Glucose, Bld 11/27/2013 134* 70 - 99 mg/dL Final  . BUN 11/27/2013 22  6 - 23 mg/dL Final  . Creatinine, Ser 11/27/2013 1.02  0.50 - 1.35 mg/dL Final  . Calcium 11/27/2013 9.6  8.4 - 10.5 mg/dL Final  . Total Protein 11/27/2013 7.0  6.0 - 8.3 g/dL Final  . Albumin 11/27/2013 4.3  3.5 - 5.2 g/dL Final  . AST 11/27/2013 23  0 - 37 U/L Final  . ALT 11/27/2013 24  0 - 53 U/L Final  . Alkaline Phosphatase 11/27/2013 75  39 - 117 U/L Final  . Total Bilirubin 11/27/2013 0.6  0.3 - 1.2 mg/dL Final  . GFR calc non Af Amer 11/27/2013 72* >90 mL/min Final  . GFR calc Af Amer 11/27/2013 83* >90 mL/min Final   Comment: (NOTE)  The eGFR has been calculated using the CKD EPI equation.                          This calculation has not been validated in all clinical situations.                          eGFR's persistently <90 mL/min signify possible Chronic Kidney                          Disease.  . Anion gap 11/27/2013 13  5 - 15 Final  . Prothrombin Time 11/27/2013 13.1  11.6 - 15.2 seconds Final  . INR 11/27/2013 0.99  0.00 - 1.49 Final  . Color, Urine 11/27/2013 YELLOW  YELLOW Final  . APPearance 11/27/2013 CLEAR  CLEAR Final  . Specific Gravity, Urine 11/27/2013 1.023  1.005 - 1.030 Final  . pH 11/27/2013 5.0  5.0 - 8.0 Final  . Glucose,  UA 11/27/2013 NEGATIVE  NEGATIVE mg/dL Final  . Hgb urine dipstick 11/27/2013 NEGATIVE  NEGATIVE Final  . Bilirubin Urine 11/27/2013 NEGATIVE  NEGATIVE Final  . Ketones, ur 11/27/2013 NEGATIVE  NEGATIVE mg/dL Final  . Protein, ur 11/27/2013 NEGATIVE  NEGATIVE mg/dL Final  . Urobilinogen, UA 11/27/2013 0.2  0.0 - 1.0 mg/dL Final  . Nitrite 11/27/2013 NEGATIVE  NEGATIVE Final  . Leukocytes, UA 11/27/2013 NEGATIVE  NEGATIVE Final   MICROSCOPIC NOT DONE ON URINES WITH NEGATIVE PROTEIN, BLOOD, LEUKOCYTES, NITRITE, OR GLUCOSE <1000 mg/dL.     X-Rays:Dg Chest 2 View  11/27/2013   CLINICAL DATA:  Preoperative evaluation for left knee surgery. History of myocardial infarction and stent placement. Ex-smoker.  EXAM: CHEST  2 VIEW  COMPARISON:  07/28/2006 radiographs.  FINDINGS: The heart size and mediastinal contours are stable. Mild accentuation of the bronchovascular markings and left basilar scarring are stable. There is no airspace disease, edema or pleural effusion. The osseous structures appear unchanged. There may be some subacromial spurring on the right.  IMPRESSION: Stable mild chronic lung disease.  No acute cardiopulmonary process.   Electronically Signed   By: Camie Patience M.D.   On: 11/27/2013 10:38    EKG: Orders placed in visit on 11/18/13  . EKG 12-LEAD     Hospital Course: [name] [name] is a 73 y.o. who was admitted to [facility]. They were brought to the operating room on 12/03/2013 and underwent Procedure(s): LEFT TOTAL KNEE ARTHROPLASTY.  Patient tolerated the procedure well and was later transferred to the recovery room and then to the orthopaedic floor for postoperative care.  They were given PO and IV analgesics for pain control following their surgery.  They were given 24 hours of postoperative antibiotics of  Anti-infectives   Start     Dose/Rate Route Frequency Ordered Stop   12/03/13 1700  ceFAZolin (ANCEF) IVPB 1 g/50 mL premix     1 g 100 mL/hr over 30 Minutes  Intravenous Every 6 hours 12/03/13 1437 12/03/13 2247   12/03/13 1122  polymyxin B 500,000 Units, bacitracin 50,000 Units in sodium chloride irrigation 0.9 % 500 mL irrigation  Status:  Discontinued       As needed 12/03/13 1122 12/03/13 1306   12/03/13 0831  ceFAZolin (ANCEF) IVPB 2 g/50 mL premix     2 g 100 mL/hr over 30 Minutes Intravenous On call to O.R. 12/03/13 0831 12/03/13 1037  and started on DVT prophylaxis in the form of Xarelto.   PT and OT were ordered for total joint protocol.  Discharge planning consulted to help with postop disposition and equipment needs.  Patient had a decent night on the evening of surgery.  They started to get up OOB with therapy on day one. Hemovac drain was pulled without difficulty.  Continued to work with therapy into day two.  Dressing was changed on day two and the incision was clean and dry.  Patient had progressed with therapy and meeting their goals.  Incision was healing well.  Patient was seen in rounds and was ready to go home.   Diet: Cardiac diet Activity:WBAT Follow-up:in 2 weeks Disposition - Skilled nursing facility Discharged Condition: stable   Discharge Instructions   Call MD / Call 911    Complete by:  As directed   If you experience chest pain or shortness of breath, CALL 911 and be transported to the hospital emergency room.  If you develope a fever above 101 F, pus (white drainage) or increased drainage or redness at the wound, or calf pain, call your surgeon's office.     Change dressing    Complete by:  As directed   Change dressing daily with sterile 4 x 4 inch gauze dressing     Constipation Prevention    Complete by:  As directed   Drink plenty of fluids.  Prune juice may be helpful.  You may use a stool softener, such as Colace (over the counter) 100 mg twice a day.  Use MiraLax (over the counter) for constipation as needed.     Diet - low sodium heart healthy    Complete by:  As directed      Discharge instructions     Complete by:  As directed   Walk with your walker. Weight bearing as tolerated [facility] will follow you at home for your therapy Change your dressing daily. Shower only, no tub bath. Call if any temperatures greater than 101 or any wound complications: [phone] during the day and ask for Dr. Charlestine Night nurse, Brunilda Payor.     Do not put a pillow under the knee. Place it under the heel.    Complete by:  As directed      Driving restrictions    Complete by:  As directed   No driving for 2 weeks     Increase activity slowly as tolerated    Complete by:  As directed             Medication List    STOP taking these medications       aspirin 81 MG tablet     naproxen 500 MG tablet  Commonly known as:  NAPROSYN     naproxen sodium 220 MG tablet  Commonly known as:  ANAPROX      TAKE these medications       chlordiazePOXIDE 25 MG capsule  Commonly known as:  LIBRIUM  Take 25 mg by mouth every morning.     docusate sodium 100 MG capsule  Commonly known as:  COLACE  Take 1 capsule (100 mg total) by mouth 2 (two) times daily.     esomeprazole 40 MG capsule  Commonly known as:  NEXIUM  Take 40 mg by mouth daily as needed (indegistion.).     HYDROcodone-acetaminophen 5-325 MG per tablet  Commonly known as:  NORCO/VICODIN  Take 1-2 tablets by mouth every 4 (four) hours as needed (breakthrough  pain).     lisinopril 10 MG tablet  Commonly known as:  PRINIVIL,ZESTRIL  Take 10 mg by mouth every morning.     methocarbamol 500 MG tablet  Commonly known as:  ROBAXIN  Take 1 tablet (500 mg total) by mouth every 6 (six) hours as needed for muscle spasms.     propranolol ER 60 MG 24 hr capsule  Commonly known as:  INDERAL LA  Take 60 mg by mouth every morning.     rivaroxaban 10 MG Tabs tablet  Commonly known as:  XARELTO  Take 1 tablet (10 mg total) by mouth daily with breakfast.     simvastatin 40 MG tablet  Commonly known as:  ZOCOR  Take 40 mg by mouth  every morning.     travoprost (benzalkonium) 0.004 % ophthalmic solution  Commonly known as:  TRAVATAN  Place 1 drop into both eyes at bedtime.           Follow-up Information   Follow up with GIOFFRE,RONALD A, MD. Schedule an appointment as soon as possible for a visit in 2 weeks.   Specialty:  Orthopedic Surgery   Contact information:   [address] [place] [address] [phone]       Signed: Ardeen Jourdain, PA-C Orthopaedic Surgery 12/05/2013, 7:43 AM

## 2013-12-05 NOTE — Progress Notes (Signed)
Physical Therapy Treatment Patient Details Name: [name] [name] MRN: [id number] DOB: 10-15-1940 Today's Date: 12/05/2013    History of Present Illness L TKR    PT Comments    Progressing well and eager for d/c to [facility]  Follow Up Recommendations  SNF     Equipment Recommendations  None recommended by PT    Recommendations for Other Services OT consult     Precautions / Restrictions Precautions Precautions: Knee;Fall Required Braces or Orthoses: Knee Immobilizer - Left Knee Immobilizer - Left: Discontinue once straight leg raise with < 10 degree lag (Pt performed IND SLR this am) Restrictions Weight Bearing Restrictions: No Other Position/Activity Restrictions: WBAT    Mobility  Bed Mobility Overal bed mobility: Needs Assistance Bed Mobility: Supine to Sit     Supine to sit: Min assist     General bed mobility comments: cues for sequence and use of R LE to self assist  Transfers Overall transfer level: Needs assistance Equipment used: Rolling walker (2 wheeled) Transfers: Sit to/from Stand Sit to Stand: Min guard;From elevated surface         General transfer comment: verbal cues for hand placement and LE management.  Ambulation/Gait Ambulation/Gait assistance: Min guard Ambulation Distance (Feet): 100 Feet (twice) Assistive device: Rolling walker (2 wheeled) Gait Pattern/deviations: Step-to pattern;Shuffle;Trunk flexed     General Gait Details: cues for sequence, posture and position from [employer]            Wheelchair Mobility    Modified Rankin (Stroke Patients Only)       Balance                                    Cognition Arousal/Alertness: Awake/alert Behavior During Therapy: WFL for tasks assessed/performed Overall Cognitive Status: Within Functional Limits for tasks assessed                      Exercises Total Joint Exercises Ankle Circles/Pumps: AROM;Both;15 reps;Supine Quad Sets:  AROM;Supine;Both;20 reps Heel Slides: AAROM;Left;Supine;20 reps Straight Leg Raises: Left;Supine;AAROM;AROM;20 reps Long Arc Quad: 20 reps;Left;AAROM;AROM;Seated Goniometric ROM: AAROM L knee -12 - 65    General Comments        Pertinent Vitals/Pain Pain Assessment: 0-10 Pain Score: 5  Pain Location: L knee Pain Descriptors / Indicators: Aching Pain Intervention(s): Limited activity within patient's tolerance;Monitored during session;Premedicated before session;Ice applied    Home Living                      Prior Function            PT Goals (current goals can now be found in the care plan section) Acute Rehab PT Goals Patient Stated Goal: Rehab and home to resume previous lifestyle with decreased pain PT Goal Formulation: With patient Time For Goal Achievement: 12/10/13 Potential to Achieve Goals: Good Progress towards PT goals: Progressing toward goals    Frequency  7X/week    PT Plan Current plan remains appropriate    Co-evaluation             End of Session Equipment Utilized During Treatment: Gait belt Activity Tolerance: Patient tolerated treatment well Patient left: with call bell/phone within reach;in chair     Time: 4098-1191 PT Time Calculation (min): 40 min  Charges:  $Gait Training: 23-37 mins $Therapeutic Exercise: 8-22 mins  G Codes:      [name] [name] 12/05/2013, 11:39 AM

## 2013-12-05 NOTE — Progress Notes (Signed)
   Subjective: 2 Days Post-Op Procedure(s) (LRB): LEFT TOTAL KNEE ARTHROPLASTY (Left) Patient reports pain as mild.   Patient seen in rounds without Dr. Darrelyn HillockGioffre. Patient is well, and has had no acute complaints or problems. No issues overnight. NO SOB or chest pain. Reports that he has walked "quite a bit" with PT. Is anxious to get to SNF today.  Plan is to go Skilled nursing facility after hospital stay.  Objective: Vital signs in last 24 hours: Temp:  [98.1 F (36.7 C)-98.8 F (37.1 C)] 98.5 F (36.9 C) (10/02 0450) Pulse Rate:  [68-86] 68 (10/02 0450) Resp:  [17-18] 18 (10/02 0450) BP: (94-123)/(54-58) 123/54 mmHg (10/02 0450) SpO2:  [96 %-98 %] 98 % (10/02 0450)  Intake/Output from previous day:  Intake/Output Summary (Last 24 hours) at 12/05/13 0740 Last data filed at 12/05/13 0612  Gross per 24 hour  Intake 1357.5 ml  Output   1575 ml  Net -217.5 ml     Labs:  Recent Labs  12/04/13 0430 12/05/13 0446  HGB 12.3* 11.6*    Recent Labs  12/04/13 0430 12/05/13 0446  WBC 14.2* 11.4*  RBC 3.99* 3.77*  HCT 37.1* 35.8*  PLT 135* 143*    Recent Labs  12/04/13 0430 12/05/13 0446  NA 141 140  K 4.8 4.7  CL 101 99  CO2 27 30  BUN 16 21  CREATININE 1.02 1.14  GLUCOSE 187* 139*  CALCIUM 9.0 9.1    EXAM General - Patient is Alert and Oriented Extremity - Neurologically intact Intact pulses distally Dorsiflexion/Plantar flexion intact Compartment soft Dressing/Incision - clean, dry, no drainage Motor Function - intact, moving foot and toes well on exam.   Past Medical History  Diagnosis Date  . Coronary artery disease     status post non-ST-elevation myocardial   infarction in May 2008 with treatment of a diagonal branch lesion  with a Taxus drug-eluting stent.  . Hyperlipidemia     with low HDL, improved on simvastatin.  Marland Kitchen. Hypertension   . Tremors of nervous system   . Myocardial infarction 2008  . GERD (gastroesophageal reflux disease)    rare, mild  . Glaucoma   . Arthritis     knees    Assessment/Plan: 2 Days Post-Op Procedure(s) (LRB): LEFT TOTAL KNEE ARTHROPLASTY (Left) Active Problems:   Osteoarthritis of left knee   Total knee replacement status  Estimated body mass index is 36.35 kg/(m^2) as calculated from the following:   Height as of this encounter: 5\' 8"  (1.727 m).   Weight as of this encounter: 108.41 kg (239 lb). Advance diet Up with therapy Discharge to SNF  DVT Prophylaxis - Xarelto Weight-Bearing as tolerated  PT this morning. Transfer to SNF today after PT. Follow up in office in 2 weeks.   Dimitri PedAmber Tori Dattilio, PA-C Orthopaedic Surgery 12/05/2013, 7:40 AM

## 2013-12-05 NOTE — Progress Notes (Signed)
Clinical Social Work Department CLINICAL SOCIAL WORK PLACEMENT NOTE 12/05/2013  Patient:  [name],[name]  Account Number:  [id number] Admit date:  12/03/2013  Clinical Social Worker:  Cori RazorJAMIE Colden Samaras, LCSW  Date/time:  12/03/2013 05:06 PM  Clinical Social Work is seeking post-discharge placement for this patient at the following level of care:   SKILLED NURSING   (*CSW will update this form in Epic as items are completed)   12/03/2013  Patient/family provided with [facility] Department of Clinical Social Work's list of facilities offering this level of care within the geographic area requested by the patient (or if unable, by the patient's family).  12/03/2013  Patient/family informed of their freedom to choose among providers that offer the needed level of care, that participate in Medicare, Medicaid or managed care program needed by the patient, have an available bed and are willing to accept the patient.    Patient/family informed of [facility]' ownership interest in [facility], as well as of the fact that they are under no obligation to receive care at this facility.  PASARR submitted to EDS on 12/03/2013 PASARR number received on 12/03/2013  FL2 transmitted to all facilities in geographic area requested by pt/family on  12/03/2013 FL2 transmitted to all facilities within larger geographic area on   Patient informed that his/her managed care company has contracts with or will negotiate with  certain facilities, including the following:     Patient/family informed of bed offers received:  12/03/2013 [facility] at [facility] Physician recommends and patient chooses bed at    [facility] on  12/05/2013 Patient to be transferred to facility by [facility] Patient and family notified of transfer on 12/05/2013 Name of family member notified:  Pt declined assistance offered by csw.  The following physician request were entered in  Epic:   Additional Comments: Pt is in agreemnet with d/c to SNF today. [facility] transport is needed. [facility]. NSG reviewed d/c summary, scripts, avs. Scripts are included in d/c packet.  Cori RazorJamie Kasean Denherder LCSW [phone]

## 2013-12-08 ENCOUNTER — Non-Acute Institutional Stay (SKILLED_NURSING_FACILITY): Payer: Medicare PPO | Admitting: Adult Health

## 2013-12-08 ENCOUNTER — Encounter: Payer: Self-pay | Admitting: Adult Health

## 2013-12-08 DIAGNOSIS — R251 Tremor, unspecified: Secondary | ICD-10-CM | POA: Insufficient documentation

## 2013-12-08 DIAGNOSIS — E785 Hyperlipidemia, unspecified: Secondary | ICD-10-CM

## 2013-12-08 DIAGNOSIS — I2510 Atherosclerotic heart disease of native coronary artery without angina pectoris: Secondary | ICD-10-CM

## 2013-12-08 DIAGNOSIS — Z96652 Presence of left artificial knee joint: Secondary | ICD-10-CM

## 2013-12-08 DIAGNOSIS — I10 Essential (primary) hypertension: Secondary | ICD-10-CM

## 2013-12-08 DIAGNOSIS — M1712 Unilateral primary osteoarthritis, left knee: Secondary | ICD-10-CM

## 2013-12-09 ENCOUNTER — Non-Acute Institutional Stay (SKILLED_NURSING_FACILITY): Payer: Medicare PPO | Admitting: Internal Medicine

## 2013-12-09 DIAGNOSIS — I2510 Atherosclerotic heart disease of native coronary artery without angina pectoris: Secondary | ICD-10-CM

## 2013-12-09 DIAGNOSIS — D62 Acute posthemorrhagic anemia: Secondary | ICD-10-CM

## 2013-12-09 DIAGNOSIS — M1712 Unilateral primary osteoarthritis, left knee: Secondary | ICD-10-CM

## 2013-12-09 DIAGNOSIS — I10 Essential (primary) hypertension: Secondary | ICD-10-CM

## 2013-12-09 NOTE — Progress Notes (Signed)
Patient ID: Karle PlumberDavid Tamer, male   DOB: 20-Feb-1941, 73 y.o.   MRN: [id number]   12/08/13  Facility:  Nursing Home Location:  [facility] Room Number: 106-P LEVEL OF CARE:  SNF (31)  Routine Visit  Chief Complaint  Patient presents with  . New Admit To SNF    HISTORY OF PRESENT ILLNESS:   This is a 73 year old male who has been [facility] 12/05/13 from [facility] with Osteoarthritis S/P Left total knee arthroplasty. He has been admitted for a short-term rehabilitation.  REASSESSMENT OF ONGOING PROBLEMS:  HTN: Pt 's HTN remains stable.  Denies CP, sob, DOE, headaches, dizziness or visual disturbances.  No complications from the medications currently being used.  Last BP : 114/69  GERD: pt's GERD is stable.  Denies ongoing heartburn, abd. Pain, nausea or vomiting.  Currently on a PPI & tolerates it without any adverse reactions.  CAD: The angina has been stable. The patient denies dyspnea on exertion, orthopnea, pedal edema, palpitations and paroxysmal nocturnal dyspnea. No complications noted from the medication presently being used.  PAST MEDICAL HISTORY:  Past Medical History  Diagnosis Date  . Coronary artery disease     status post non-ST-elevation myocardial   infarction in May 2008 with treatment of a diagonal branch lesion  with a Taxus drug-eluting stent.  . Hyperlipidemia     with low HDL, improved on simvastatin.  Marland Kitchen. Hypertension   . Tremors of nervous system   . Myocardial infarction 2008  . GERD (gastroesophageal reflux disease)     rare, mild  . Glaucoma   . Arthritis     knees    CURRENT MEDICATIONS: Reviewed per MAR/see medication list  No Known Allergies   REVIEW OF SYSTEMS:  GENERAL: no change in appetite, no fatigue, no weight changes, no fever, chills or weakness RESPIRATORY: no cough, SOB, DOE, wheezing, hemoptysis CARDIAC: no chest pain,or palpitations GI: no abdominal pain, diarrhea,  constipation, heart burn, nausea or vomiting  PHYSICAL EXAMINATION  GENERAL: no acute distress,obese EYES: conjunctivae normal, sclerae normal, normal eye lids NECK: supple, trachea midline, no neck masses, no thyroid tenderness, no thyromegaly LYMPHATICS: no LAN in the neck, no supraclavicular LAN RESPIRATORY: breathing is even & unlabored, BS CTAB CARDIAC: RRR, no murmur,no extra heart sounds, LLE edema 1+ GI: abdomen soft, normal BS, no masses, no tenderness, no hepatomegaly, no splenomegaly EXTREMITIES: able to move all 4 extremities PSYCHIATRIC: the patient is alert & oriented to person, affect & behavior appropriate  LABS/RADIOLOGY: Labs reviewed: Basic Metabolic Panel:  Recent Labs  40/98/1109/24/15 1015 12/04/13 0430 12/05/13 0446  NA 140 141 140  K 5.2 4.8 4.7  CL 101 101 99  CO2 26 27 30   GLUCOSE 134* 187* 139*  BUN 22 16 21   CREATININE 1.02 1.02 1.14  CALCIUM 9.6 9.0 9.1   Liver Function Tests:  Recent Labs  11/27/13 1015  AST 23  ALT 24  ALKPHOS 75  BILITOT 0.6  PROT 7.0  ALBUMIN 4.3   CBC:  Recent Labs  11/27/13 1015 12/04/13 0430 12/05/13 0446  WBC 7.8 14.2* 11.4*  HGB 15.0 12.3* 11.6*  HCT 45.4 37.1* 35.8*  MCV 92.5 93.0 95.0  PLT 170 135* 143*   Dg Chest 2 View  11/27/2013   CLINICAL DATA:  Preoperative evaluation for left knee surgery. History of myocardial infarction and stent placement. Ex-smoker.  EXAM: CHEST  2 VIEW  COMPARISON:  07/28/2006 radiographs.  FINDINGS: The  heart size and mediastinal contours are stable. Mild accentuation of the bronchovascular markings and left basilar scarring are stable. There is no airspace disease, edema or pleural effusion. The osseous structures appear unchanged. There may be some subacromial spurring on the right.  IMPRESSION: Stable mild chronic lung disease.  No acute cardiopulmonary process.   Electronically Signed   By: Roxy Horseman M.D.   On: 11/27/2013 10:38    ASSESSMENT/PLAN:  Osteoarthritis   status post left total knee arthroplasty -  for rehabilitation Tremors - continue Librium and Inderal Hypertension - well controlled; continue lisinopril Constipation - continue Colace GERD - stable; continue Nexium Hyperlipidemia - continue Zocor CAD - stable   CPT CODE: 16109    [facility], NP [facility] [phone]

## 2013-12-11 NOTE — Progress Notes (Signed)
HISTORY & PHYSICAL  DATE: 12/09/2013   FACILITY: [facility]  LEVEL OF CARE: SNF (31)  ALLERGIES:  No Known Allergies  CHIEF COMPLAINT:  Manage left knee osteoarthritis, CAD and hypertension  HISTORY OF PRESENT ILLNESS: Patient is a 73 year old Caucasian male.  KNEE OSTEOARTHRITIS: Patient had a history of pain and functional disability in the knee due to end-stage osteoarthritis and has failed nonsurgical conservative treatments. Patient had worsening of pain with activity and weight bearing, pain that interfered with activities of daily living & pain with passive range of motion. Therefore patient underwent total knee arthroplasty and tolerated the procedure well. Patient is admitted to this facility for sort short-term rehabilitation. Patient denies knee pain.  CAD: The angina has been stable. The patient denies dyspnea on exertion, orthopnea, palpitations and paroxysmal nocturnal dyspnea. No complications noted from the medication presently being used. Complains of chronic bilateral lower extremity swelling.  HTN: Pt 's HTN remains stable.  Denies CP, sob, DOE, headaches, dizziness or visual disturbances.  No complications from the medications currently being used.  Last BP : 119/70.  PAST MEDICAL HISTORY :  Past Medical History  Diagnosis Date  . Coronary artery disease     status post non-ST-elevation myocardial   infarction in May 2008 with treatment of a diagonal branch lesion  with a Taxus drug-eluting stent.  . Hyperlipidemia     with low HDL, improved on simvastatin.  Marland Kitchen Hypertension   . Tremors of nervous system   . Myocardial infarction 2008  . GERD (gastroesophageal reflux disease)     rare, mild  . Glaucoma   . Arthritis     knees    PAST SURGICAL HISTORY: Past Surgical History  Procedure Laterality Date  . Knee surgery Bilateral   . Shoulder surgery Bilateral     rotator cuff repair  . Hand surgery Right     replacement of 2  knuckles  . Elbow surgery Right     tendon surgery  . Cardiac catheterization  2008  . Coronary angioplasty  2008  . Eye surgery Bilateral last 3 years ago     laser surgery x2 each- relieves pressure  . Back surgery      lower  . Total knee arthroplasty Left 12/03/2013    Procedure: LEFT TOTAL KNEE ARTHROPLASTY;  Surgeon: Jacki Cones, MD;  Location: [facility];  Service: Orthopedics;  Laterality: Left;    SOCIAL HISTORY:  reports that he quit smoking about 33 years ago. His smoking use included Cigarettes. He smoked 0.00 packs per day. He has never used smokeless tobacco. He reports that he drinks alcohol. He reports that he does not use illicit drugs.  FAMILY HISTORY: None  CURRENT MEDICATIONS: Reviewed per MAR/see medication list  REVIEW OF SYSTEMS:  See HPI otherwise 14 point ROS is negative.  PHYSICAL EXAMINATION  VS:  See VS section  GENERAL: no acute distress, moderately obese body habitus EYES: conjunctivae normal, sclerae normal, normal eye lids MOUTH/THROAT: lips without lesions,no lesions in the mouth,tongue is without lesions,uvula elevates in midline NECK: supple, trachea midline, no neck masses, no thyroid tenderness, no thyromegaly LYMPHATICS: no LAN in the neck, no supraclavicular LAN RESPIRATORY: breathing is even & unlabored, BS CTAB CARDIAC: RRR, no murmur,no extra heart sounds, +3 left lower extremity and +2 right lower extremity edema GI:  ABDOMEN: abdomen soft, normal BS, no masses, no tenderness  LIVER/SPLEEN: no hepatomegaly, no splenomegaly MUSCULOSKELETAL: HEAD: normal to inspection  EXTREMITIES: LEFT UPPER EXTREMITY: full range of motion, normal strength & tone RIGHT UPPER EXTREMITY:  full range of motion, normal strength & tone LEFT LOWER EXTREMITY: range of motion not tested due to surgery, normal strength & tone RIGHT LOWER EXTREMITY: Moderate range of motion, normal strength & tone PSYCHIATRIC: the patient is alert & oriented to person, affect  & behavior appropriate  LABS/RADIOLOGY:  Labs reviewed: Basic Metabolic Panel:  Recent Labs  16/12/9607/24/15 1015 12/04/13 0430 12/05/13 0446  NA 140 141 140  K 5.2 4.8 4.7  CL 101 101 99  CO2 26 27 30   GLUCOSE 134* 187* 139*  BUN 22 16 21   CREATININE 1.02 1.02 1.14  CALCIUM 9.6 9.0 9.1   Liver Function Tests:  Recent Labs  11/27/13 1015  AST 23  ALT 24  ALKPHOS 75  BILITOT 0.6  PROT 7.0  ALBUMIN 4.3   CBC:  Recent Labs  11/27/13 1015 12/04/13 0430 12/05/13 0446  WBC 7.8 14.2* 11.4*  HGB 15.0 12.3* 11.6*  HCT 45.4 37.1* 35.8*  MCV 92.5 93.0 95.0  PLT 170 135* 143*    CHEST  2 VIEW   COMPARISON:  07/28/2006 radiographs.   FINDINGS: The heart size and mediastinal contours are stable. Mild accentuation of the bronchovascular markings and left basilar scarring are stable. There is no airspace disease, edema or pleural effusion. The osseous structures appear unchanged. There may be some subacromial spurring on the right.   IMPRESSION: Stable mild chronic lung disease.  No acute cardiopulmonary process   ASSESSMENT/PLAN:  Left knee osteoarthritis-status post left total knee arthroplasty. Continue rehabilitation. CAD-stable Hypertension-well-controlled Acute blood loss anemia-recheck hemoglobin Hyperlipidemia-continue zocor GERD-continue PPI Check CBC  I have reviewed patient's medical records received at admission/from hospitalization.  CPT CODE: [id number]  Angela CoxGayani Y Oluwafemi Villella, MD [facility] [phone]

## 2013-12-12 ENCOUNTER — Encounter: Payer: Self-pay | Admitting: Adult Health

## 2013-12-12 ENCOUNTER — Non-Acute Institutional Stay (SKILLED_NURSING_FACILITY): Payer: Medicare PPO | Admitting: Adult Health

## 2013-12-12 DIAGNOSIS — M1712 Unilateral primary osteoarthritis, left knee: Secondary | ICD-10-CM

## 2013-12-12 DIAGNOSIS — I10 Essential (primary) hypertension: Secondary | ICD-10-CM

## 2013-12-12 DIAGNOSIS — D62 Acute posthemorrhagic anemia: Secondary | ICD-10-CM

## 2013-12-12 DIAGNOSIS — I2510 Atherosclerotic heart disease of native coronary artery without angina pectoris: Secondary | ICD-10-CM

## 2013-12-12 DIAGNOSIS — E785 Hyperlipidemia, unspecified: Secondary | ICD-10-CM

## 2013-12-12 DIAGNOSIS — Z96652 Presence of left artificial knee joint: Secondary | ICD-10-CM

## 2013-12-12 DIAGNOSIS — R251 Tremor, unspecified: Secondary | ICD-10-CM

## 2013-12-12 NOTE — Progress Notes (Addendum)
Patient ID: [name] [name], male   DOB: April 18, 1940, 73 y.o.   MRN: [id number]   12/12/13  Facility:  Nursing Home Location:  [facility] Room Number: 106-P LEVEL OF CARE:  SNF (31)  Routine Visit  Chief Complaint  Patient presents with  . Discharge Note    HISTORY OF PRESENT ILLNESS:   This is a 73 year old male who has [facility] with Osteoarthritis S/P Left total knee arthroplasty. He has been admitted for a short-term rehabilitation. Patient was admitted to this facility for short-term rehabilitation after the patient's recent hospitalization.  Patient has completed SNF rehabilitation and therapy has cleared the patient for discharge.  REASSESSMENT OF ONGOING PROBLEMS:  ANEMIA: The anemia has been stable. The patient denies fatigue, melena or hematochezia. No complications from the medications currently being used. 10/15  hgb 11.0  HYPERLIPIDEMIA: No complications from the medications presently being used. Last fasting lipid panel showed : not available  HTN: Pt 's HTN remains stable.  Denies CP, sob, DOE, headaches, dizziness or visual disturbances.  No complications from the medications currently being used.  Last BP : 125/74  PAST MEDICAL HISTORY:  Past Medical History  Diagnosis Date  . Coronary artery disease     status post non-ST-elevation myocardial   infarction in May 2008 with treatment of a diagonal branch lesion  with a Taxus drug-eluting stent.  . Hyperlipidemia     with low HDL, improved on simvastatin.  Marland Kitchen. Hypertension   . Tremors of nervous system   . Myocardial infarction 2008  . GERD (gastroesophageal reflux disease)     rare, mild  . Glaucoma   . Arthritis     knees    CURRENT MEDICATIONS: Reviewed per MAR/see medication list  No Known Allergies   REVIEW OF SYSTEMS:  GENERAL: no change in appetite, no fatigue, no weight changes, no fever, chills or  weakness RESPIRATORY: no cough, SOB, DOE, wheezing, hemoptysis CARDIAC: no chest pain,or palpitations GI: no abdominal pain, diarrhea, constipation, heart burn, nausea or vomiting  PHYSICAL EXAMINATION  GENERAL: no acute distress,obese NECK: supple, trachea midline, no neck masses, no thyroid tenderness, no thyromegaly LYMPHATICS: no LAN in the neck, no supraclavicular LAN RESPIRATORY: breathing is even & unlabored, BS CTAB CARDIAC: RRR, no murmur,no extra heart sounds, LLE edema 1+ GI: abdomen soft, normal BS, no masses, no tenderness, no hepatomegaly, no splenomegaly EXTREMITIES: able to move all 4 extremities PSYCHIATRIC: the patient is alert & oriented to person, affect & behavior appropriate  LABS/RADIOLOGY: 12/08/13  WBC 8.9 hemoglobin 11.0 hematocrit 35.5 MCV 97.0 Labs reviewed: Basic Metabolic Panel:  Recent Labs  40/98/1109/24/15 1015 12/04/13 0430 12/05/13 0446  NA 140 141 140  K 5.2 4.8 4.7  CL 101 101 99  CO2 26 27 30   GLUCOSE 134* 187* 139*  BUN 22 16 21   CREATININE 1.02 1.02 1.14  CALCIUM 9.6 9.0 9.1   Liver Function Tests:  Recent Labs  11/27/13 1015  AST 23  ALT 24  ALKPHOS 75  BILITOT 0.6  PROT 7.0  ALBUMIN 4.3   CBC:  Recent Labs  11/27/13 1015 12/04/13 0430 12/05/13 0446  WBC 7.8 14.2* 11.4*  HGB 15.0 12.3* 11.6*  HCT 45.4 37.1* 35.8*  MCV 92.5 93.0 95.0  PLT 170 135* 143*   Dg Chest 2 View  11/27/2013   CLINICAL DATA:  Preoperative evaluation for left knee surgery. History of myocardial infarction and stent placement.  Ex-smoker.  EXAM: CHEST  2 VIEW  COMPARISON:  07/28/2006 radiographs.  FINDINGS: The heart size and mediastinal contours are stable. Mild accentuation of the bronchovascular markings and left basilar scarring are stable. There is no airspace disease, edema or pleural effusion. The osseous structures appear unchanged. There may be some subacromial spurring on the right.  IMPRESSION: Stable mild chronic lung disease.  No acute  cardiopulmonary process.   Electronically Signed   By: Roxy HorsemanBill  Veazey M.D.   On: 11/27/2013 10:38    ASSESSMENT/PLAN:  Osteoarthritis  status post left total knee arthroplasty -  for home health PT and OT  Tremors - stable; continue Librium and Inderal Hypertension - well controlled; continue lisinopril Constipation - stable; continue Colace GERD - stable; continue Omeprazole Hyperlipidemia - continue Zocor CAD - stable   I have filled out patient's discharge paperwork and written prescriptions.  Patient will receive home health PT and OT.  DME provided: [employer]  Total discharge time: Greater than 30 minutes  Discharge time involved coordination of the discharge process with Child psychotherapistsocial worker, nursing staff and therapy department. Medical justification for home health services/DME verified.   CPT CODE: [id number]    [facility]Graig Hessling, NP [facility] [phone]

## 2013-12-14 DIAGNOSIS — I2510 Atherosclerotic heart disease of native coronary artery without angina pectoris: Secondary | ICD-10-CM

## 2013-12-14 DIAGNOSIS — Z96652 Presence of left artificial knee joint: Secondary | ICD-10-CM

## 2013-12-14 DIAGNOSIS — Z471 Aftercare following joint replacement surgery: Secondary | ICD-10-CM

## 2013-12-14 DIAGNOSIS — M1712 Unilateral primary osteoarthritis, left knee: Secondary | ICD-10-CM

## 2013-12-31 ENCOUNTER — Ambulatory Visit: Payer: Medicare PPO | Attending: Physical Therapy | Admitting: Physical Therapy

## 2013-12-31 DIAGNOSIS — Z5189 Encounter for other specified aftercare: Secondary | ICD-10-CM | POA: Insufficient documentation

## 2013-12-31 DIAGNOSIS — Z96652 Presence of left artificial knee joint: Secondary | ICD-10-CM | POA: Diagnosis not present

## 2013-12-31 DIAGNOSIS — M2560 Stiffness of unspecified joint, not elsewhere classified: Secondary | ICD-10-CM | POA: Insufficient documentation

## 2013-12-31 DIAGNOSIS — R262 Difficulty in walking, not elsewhere classified: Secondary | ICD-10-CM | POA: Diagnosis not present

## 2013-12-31 DIAGNOSIS — M25569 Pain in unspecified knee: Secondary | ICD-10-CM | POA: Diagnosis not present

## 2014-03-04 ENCOUNTER — Emergency Department (HOSPITAL_COMMUNITY): Payer: Medicare PPO

## 2014-03-04 ENCOUNTER — Emergency Department (HOSPITAL_COMMUNITY)
Admission: EM | Admit: 2014-03-04 | Discharge: 2014-03-04 | Disposition: A | Discharge status: Home / Self Care | Payer: Medicare PPO | Attending: Emergency Medicine | Admitting: Emergency Medicine

## 2014-03-04 ENCOUNTER — Encounter (HOSPITAL_COMMUNITY): Payer: Self-pay

## 2014-03-04 DIAGNOSIS — N202 Calculus of kidney with calculus of ureter: Secondary | ICD-10-CM | POA: Diagnosis not present

## 2014-03-04 DIAGNOSIS — Z7982 Long term (current) use of aspirin: Secondary | ICD-10-CM | POA: Diagnosis not present

## 2014-03-04 DIAGNOSIS — M1990 Unspecified osteoarthritis, unspecified site: Secondary | ICD-10-CM | POA: Diagnosis not present

## 2014-03-04 DIAGNOSIS — E785 Hyperlipidemia, unspecified: Secondary | ICD-10-CM | POA: Insufficient documentation

## 2014-03-04 DIAGNOSIS — Z9889 Other specified postprocedural states: Secondary | ICD-10-CM | POA: Diagnosis not present

## 2014-03-04 DIAGNOSIS — I10 Essential (primary) hypertension: Secondary | ICD-10-CM | POA: Insufficient documentation

## 2014-03-04 DIAGNOSIS — N201 Calculus of ureter: Secondary | ICD-10-CM

## 2014-03-04 DIAGNOSIS — K219 Gastro-esophageal reflux disease without esophagitis: Secondary | ICD-10-CM | POA: Diagnosis not present

## 2014-03-04 DIAGNOSIS — I2510 Atherosclerotic heart disease of native coronary artery without angina pectoris: Secondary | ICD-10-CM | POA: Insufficient documentation

## 2014-03-04 DIAGNOSIS — I252 Old myocardial infarction: Secondary | ICD-10-CM | POA: Diagnosis not present

## 2014-03-04 DIAGNOSIS — Z79899 Other long term (current) drug therapy: Secondary | ICD-10-CM | POA: Insufficient documentation

## 2014-03-04 DIAGNOSIS — Z87891 Personal history of nicotine dependence: Secondary | ICD-10-CM | POA: Insufficient documentation

## 2014-03-04 DIAGNOSIS — R109 Unspecified abdominal pain: Secondary | ICD-10-CM

## 2014-03-04 DIAGNOSIS — Z8669 Personal history of other diseases of the nervous system and sense organs: Secondary | ICD-10-CM | POA: Insufficient documentation

## 2014-03-04 DIAGNOSIS — N200 Calculus of kidney: Secondary | ICD-10-CM

## 2014-03-04 DIAGNOSIS — M546 Pain in thoracic spine: Secondary | ICD-10-CM | POA: Diagnosis present

## 2014-03-04 DIAGNOSIS — M549 Dorsalgia, unspecified: Secondary | ICD-10-CM

## 2014-03-04 LAB — URINALYSIS, ROUTINE W REFLEX MICROSCOPIC
Glucose, UA: NEGATIVE mg/dL
Hgb urine dipstick: NEGATIVE
Ketones, ur: NEGATIVE mg/dL
Leukocytes, UA: NEGATIVE
Nitrite: NEGATIVE
Protein, ur: 30 mg/dL — AB
Specific Gravity, Urine: 1.034 — ABNORMAL HIGH (ref 1.005–1.030)
Urobilinogen, UA: 0.2 mg/dL (ref 0.0–1.0)
pH: 5.5 (ref 5.0–8.0)

## 2014-03-04 LAB — I-STAT CHEM 8, ED
BUN: 33 mg/dL — ABNORMAL HIGH (ref 6–23)
Calcium, Ion: 1.16 mmol/L (ref 1.13–1.30)
Chloride: 102 mEq/L (ref 96–112)
Creatinine, Ser: 1.7 mg/dL — ABNORMAL HIGH (ref 0.50–1.35)
Glucose, Bld: 116 mg/dL — ABNORMAL HIGH (ref 70–99)
HCT: 54 % — ABNORMAL HIGH (ref 39.0–52.0)
Hemoglobin: 18.4 g/dL — ABNORMAL HIGH (ref 13.0–17.0)
Potassium: 5.2 mmol/L — ABNORMAL HIGH (ref 3.5–5.1)
Sodium: 137 mmol/L (ref 135–145)
TCO2: 24 mmol/L (ref 0–100)

## 2014-03-04 LAB — URINE MICROSCOPIC-ADD ON

## 2014-03-04 MED ORDER — HYDROCODONE-ACETAMINOPHEN 5-325 MG PO TABS: ORAL_TABLET | ORAL | Status: DC

## 2014-03-04 MED ORDER — TAMSULOSIN HCL 0.4 MG PO CAPS: 0.4000 mg | ORAL_CAPSULE | Freq: Every day | ORAL | Status: DC

## 2014-03-04 NOTE — ED Notes (Signed)
Pt presents with c/o back pain that started Christmas night. Pt denies any injury to that area but reports the pain has gotten worse since Christmas. Pt has been taking some pain medication at home which has provided relief. Ambulatory to triage.

## 2014-03-04 NOTE — ED Provider Notes (Signed)
CSN: [id number]     Arrival date & time 03/04/14  1618 History  This chart was scribed for non-physician practitioner,Carina Chaplin Mirian Capuchin, working with Arby Barrette, MD, by Lionel December, ED Scribe. This patient was seen in room WTR8/WTR8 and the patient's care was started at 4:43 PM.   Chief Complaint  Patient presents with  . Back Pain     (Consider location/radiation/quality/duration/timing/severity/associated sxs/prior Treatment) The history is provided by the patient. No language interpreter was used.    HPI Comments: [name] [name] is a 73 y.o. male who presents to the Emergency Department complaining of 10/10 mid to lower right back pain that started 5 days ago.  Patient states that within an hour after he got home, the pain worsened.He denies falling.  Patient took Vicodin medication for the pain and the pain went down to a 3/10. Patient has had a cold with a cough and suspects that he may have strained his back from coughing. Patient states that he has had back surgery several years ago.Patient finished his therapy this week for surgery in his left knee replacement. He has no history of osteoporosis or cancer. He has had a heart attack in the past. Denies numbness or tingling, dysuria, fevers, naseua, vomiting, itching, rashes.  Denies recent heavy lifting, falls or injury.  Denies hx of renal stones.   Past Medical History  Diagnosis Date  . Coronary artery disease     status post non-ST-elevation myocardial   infarction in May 2008 with treatment of a diagonal branch lesion  with a Taxus drug-eluting stent.  . Hyperlipidemia     with low HDL, improved on simvastatin.  Marland Kitchen Hypertension   . Tremors of nervous system   . Myocardial infarction 2008  . GERD (gastroesophageal reflux disease)     rare, mild  . Glaucoma   . Arthritis     knees   Past Surgical History  Procedure Laterality Date  . Knee surgery Bilateral   . Shoulder surgery Bilateral     rotator cuff repair  .  Hand surgery Right     replacement of 2 knuckles  . Elbow surgery Right     tendon surgery  . Cardiac catheterization  2008  . Coronary angioplasty  2008  . Eye surgery Bilateral last 3 years ago     laser surgery x2 each- relieves pressure  . Back surgery      lower  . Total knee arthroplasty Left 12/03/2013    Procedure: LEFT TOTAL KNEE ARTHROPLASTY;  Surgeon: Jacki Cones, MD;  Location: [facility];  Service: Orthopedics;  Laterality: Left;   No family history on file. History  Substance Use Topics  . Smoking status: Former Smoker    Types: Cigarettes    Quit date: 07/27/1980  . Smokeless tobacco: Never Used  . Alcohol Use: Yes     Comment: occasional    Review of Systems  Constitutional: Negative for fever and chills.  Respiratory: Positive for cough. Negative for shortness of breath.   Cardiovascular: Negative for chest pain and palpitations.  Gastrointestinal: Positive for nausea. Negative for vomiting, abdominal pain, diarrhea and constipation.  Genitourinary: Negative for dysuria, frequency and hematuria.  Musculoskeletal: Positive for back pain.  Skin: Negative for rash.  Neurological: Negative for weakness and numbness.  All other systems reviewed and are negative.     Allergies  Review of patient's allergies indicates no known allergies.  Home Medications   Prior to Admission medications   Medication Sig Start Date  End Date Taking? Authorizing Provider  aspirin EC 81 MG tablet Take 81 mg by mouth daily.   Yes Historical Provider, MD  chlordiazePOXIDE (LIBRIUM) 25 MG capsule Take one capsule by mouth once daily 12/05/13  Yes Sharee Holstereborah S Green, NP  esomeprazole (NEXIUM) 40 MG capsule Take 40 mg by mouth daily as needed (indegistion.).    Yes Historical Provider, MD  lisinopril (PRINIVIL,ZESTRIL) 10 MG tablet Take 10 mg by mouth every morning.   Yes Historical Provider, MD  naproxen sodium (ANAPROX) 220 MG tablet Take 220 mg by mouth daily as needed (pain.).   Yes  Historical Provider, MD  propranolol ER (INDERAL LA) 60 MG 24 hr capsule Take 60 mg by mouth every morning.   Yes Historical Provider, MD  simvastatin (ZOCOR) 40 MG tablet Take 40 mg by mouth every morning.   Yes Historical Provider, MD  travoprost, benzalkonium, (TRAVATAN) 0.004 % ophthalmic solution Place 1 drop into both eyes at bedtime.    Yes Historical Provider, MD  docusate sodium (COLACE) 100 MG capsule Take 1 capsule (100 mg total) by mouth 2 (two) times daily. Patient not taking: Reported on 03/04/2014 12/05/13   Amber Tamala SerLauren Constable, PA-C  HYDROcodone-acetaminophen (NORCO/VICODIN) 5-325 MG per tablet Take one tablet by mouth every 4 hours as needed for mild/moderate pain; Take two tablets by mouth every 4 hours as needed for moderate/severe pain 03/04/14   Junius FinnerErin O'Malley, PA-C  methocarbamol (ROBAXIN) 500 MG tablet Take 1 tablet (500 mg total) by mouth every 6 (six) hours as needed for muscle spasms. Patient not taking: Reported on 03/04/2014 12/05/13   Amber Tamala SerLauren Constable, PA-C  rivaroxaban (XARELTO) 10 MG TABS tablet Take 1 tablet (10 mg total) by mouth daily with breakfast. Patient not taking: Reported on 03/04/2014 12/05/13   Amber Tamala SerLauren Constable, PA-C  tamsulosin (FLOMAX) 0.4 MG CAPS capsule Take 1 capsule (0.4 mg total) by mouth daily. 03/04/14   Junius FinnerErin O'Malley, PA-C   BP 114/71 mmHg  Pulse 85  Temp(Src) 97.4 F (36.3 C) (Oral)  Resp 18  SpO2 94% Physical Exam  Constitutional: He is oriented to person, place, and time. He appears well-developed and well-nourished.  HENT:  Head: Normocephalic and atraumatic.  Eyes: EOM are normal.  Neck: Normal range of motion.  Cardiovascular: Normal rate, regular rhythm and normal heart sounds.   Pulmonary/Chest: Effort normal and breath sounds normal. No respiratory distress. He has no wheezes. He has no rales.  Musculoskeletal: Normal range of motion. He exhibits tenderness. He exhibits no edema.  No midline spinal tenderness,  tenderness to right lower thoracic and upper lumbar musculature.   Neurological: He is alert and oriented to person, place, and time.  Skin: Skin is warm and dry. No erythema.  No rash or lesions.   Psychiatric: He has a normal mood and affect. His behavior is normal.  Nursing note and vitals reviewed.   ED Course  Procedures (including critical care time) DIAGNOSTIC STUDIES: Oxygen Saturation is 94% on RA, adequate by my interpretation.    COORDINATION OF CARE: 4:48 PM Discussed treatment plan with patient at beside, the patient agrees with the plan and has no further questions at this time.  Labs Review Labs Reviewed  URINALYSIS, ROUTINE W REFLEX MICROSCOPIC - Abnormal; Notable for the following:    Color, Urine AMBER (*)    Specific Gravity, Urine 1.034 (*)    Bilirubin Urine SMALL (*)    Protein, ur 30 (*)    All other components within normal limits  URINE MICROSCOPIC-ADD ON - Abnormal; Notable for the following:    Bacteria, UA FEW (*)    All other components within normal limits  I-STAT CHEM 8, ED - Abnormal; Notable for the following:    Potassium 5.2 (*)    BUN 33 (*)    Creatinine, Ser 1.70 (*)    Glucose, Bld 116 (*)    Hemoglobin 18.4 (*)    HCT 54.0 (*)    All other components within normal limits    Imaging Review Dg Thoracic Spine 2 View  03/04/2014   CLINICAL DATA:  Five days of upper and lower back pain without known injury  EXAM: THORACIC SPINE - 2 VIEW  COMPARISON:  PA and lateral chest x-ray dated November 27, 2013  FINDINGS: The thoracic vertebral bodies are preserved in height. There is mild degenerative disc change at multiple levels. There are anterior bridging osteophytes in the lower thoracic spine. There are no abnormal paravertebral soft tissue densities.  IMPRESSION: There are mild degenerative disc changes at multiple thoracic levels. There is no compression fracture. If there are clinical findings that might suggest deep infection, further  evaluation with CT scanning or MRI may be useful.   Electronically Signed   By: Dimitrious  [place]   On: 03/04/2014 17:21   Dg Lumbar Spine Complete  03/04/2014   CLINICAL DATA:  Five-day history of upper and lower back pain without known injury  EXAM: LUMBAR SPINE - COMPLETE 4+ VIEW  COMPARISON:  None.  FINDINGS: The lumbar vertebral bodies are preserved in height. There is disc space narrowing. At L2-3, L4-5, and L5-S1. There are anterior endplate osteophytes at all lumbar levels. There is significant facet joint hypertrophy at L4-5 and L5-S1. There is no significant spondylolisthesis. The pedicles and transverse processes are intact. There are mild degenerative changes of the SI joints.  IMPRESSION: There is moderate degenerative disc and facet joint change at multiple levels of the lumbar spine. There is no compression fracture.   Electronically Signed   By: Laiken  [place]   On: 03/04/2014 17:23   Ct Renal Stone Study  03/04/2014   CLINICAL DATA:  Right-sided flank pain for 1 week.  ICD10: R 10.9  EXAM: CT ABDOMEN AND PELVIS WITHOUT CONTRAST  TECHNIQUE: Multidetector CT imaging of the abdomen and pelvis was performed following the standard protocol without IV contrast.  COMPARISON:  Lumbar spine radiographs of earlier today.  FINDINGS: Lower chest: Mild scarring at the lung bases. Minimal medial right lung base bronchiectasis which is likely post infectious or inflammatory. Normal heart size without pericardial or pleural effusion. Right coronary artery atherosclerosis.  Hepatobiliary: Mild hepatic steatosis. Normal gallbladder, without biliary ductal dilatation.  Pancreas: Normal, without mass or pancreatic ductal dilatation.  Spleen: Normal  Adrenals/Urinary Tract: Normal adrenal glands. Mild renal cortical thinning bilaterally. Two punctate left renal collecting system stones. Mild right-sided hydroureteronephrosis. This continues to the level of a 2 mm distal right ureteric stone on transverse image 78 and  coronal image 73. No bladder calculi.  Stomach/Bowel: Normal stomach, without wall thickening. Normal colon and terminal ileum. Appendix not visualized. Normal small bowel.  Vascular/Lymphatic: Aortic atherosclerosis. No abdominopelvic adenopathy.  Reproductive: Mild prostatomegaly.  Other: No significant free fluid. Bilateral fat containing inguinal hernias.  Musculoskeletal: Left femoral head bone island. Partial degenerative fusion of the sacroiliac joints. Advanced lumbosacral spondylosis.  IMPRESSION: 1. Mild right-sided hydroureteronephrosis secondary to a distal right ureteric stone. 2. Left nephrolithiasis. 3. Hepatic steatosis. 4.  Atherosclerosis, including within the coronary  arteries.   Electronically Signed   By: Jeronimo GreavesKyle  Talbot M.D.   On: 03/04/2014 18:25     EKG Interpretation None      MDM   Final diagnoses:  Mid back pain on right side  Right flank pain  Right ureteral stone  Left nephrolithiasis    Pt c/o right sided mid to lower back pain x 5 days.  Denies fever. No urinary symptoms. No known injuries. Pt is tender along right musculature of thoracic and lumbar spine. No midline spinal tenderness. No evidence of shingles. Discussed pt with Dr. Donnald GarrePfeiffer who recommended also getting CT renal study.  CT renal study: significant for right-sided hydroureteronephrosis secondary to distal right ureteric stone. Left nephrolithiasis.  UA: unremarkable. Chem-8: Cr: mildly elevated to 1.7 up from 1.14 two months ago.   Discussed pt with Dr. Donnald GarrePfeiffer, pt hemodynamically stable, may be discharged home with symptomatic tx, Rx: vicodin and flomax. Advised to f/u with PCP in 1 week for recheck of symptoms and labs. Return precautions provided. Pt verbalized understanding and agreement with tx plan.   I personally performed the services described in this documentation, which was scribed in my presence. The recorded information has been reviewed and is accurate.     Junius Finnerrin O'Malley,  PA-C 03/04/14 2008  Arby BarretteMarcy Pfeiffer, MD 03/05/14 [phone]

## 2014-05-26 ENCOUNTER — Encounter: Payer: Self-pay | Admitting: Cardiovascular Disease

## 2014-05-26 ENCOUNTER — Ambulatory Visit (INDEPENDENT_AMBULATORY_CARE_PROVIDER_SITE_OTHER): Payer: Medicare PPO | Admitting: Cardiovascular Disease

## 2014-05-26 VITALS — BP 126/60 | HR 70 | Ht 68.0 in | Wt 227.8 lb

## 2014-05-26 DIAGNOSIS — E785 Hyperlipidemia, unspecified: Secondary | ICD-10-CM

## 2014-05-26 DIAGNOSIS — I2510 Atherosclerotic heart disease of native coronary artery without angina pectoris: Secondary | ICD-10-CM

## 2014-05-26 DIAGNOSIS — I10 Essential (primary) hypertension: Secondary | ICD-10-CM

## 2014-05-26 NOTE — Patient Instructions (Signed)
Your physician wants you to follow-up in:  6 months. You will receive a reminder letter in the mail two months in advance. If you don't receive a letter, please call our office to schedule the follow-up appointment.   

## 2014-05-26 NOTE — Progress Notes (Signed)
CC: Follow up CAD. Right knee pain.   History of Present Illness: 74 yo male with history of CAD, HTN, HLD here today for cardiac follow up. He has been followed in the past by Dr. Juanda ChanceBrodie. In 2007 he had a non-ST elevation MI treated with a Taxus stent placed in the diagonal branch of the LAD. He had 70-80% narrowing of the right coronary and later had a negative Myoview scan. His RCA disease has been managed medically since then. He had his left knee replaced in September 2015.   He is here today for follow up. He has had no chest pain, SOB, palpitations, near syncope or syncope. He is active. He is feeling great.     Primary Care Physician: Marin CommentWells, Cheryl  Orthopedics: Ranee GosselinGioffre, Ronald  Last Lipid Profile: followed by primary care  Past Medical History  Diagnosis Date  . Coronary artery disease     status post non-ST-elevation myocardial   infarction in May 2008 with treatment of a diagonal branch lesion  with a Taxus drug-eluting stent.  . Hyperlipidemia     with low HDL, improved on simvastatin.  Marland Kitchen. Hypertension   . Tremors of nervous system   . Myocardial infarction 2008  . GERD (gastroesophageal reflux disease)     rare, mild  . Glaucoma   . Arthritis     knees    Past Surgical History  Procedure Laterality Date  . Knee surgery Bilateral   . Shoulder surgery Bilateral     rotator cuff repair  . Hand surgery Right     replacement of 2 knuckles  . Elbow surgery Right     tendon surgery  . Cardiac catheterization  2008  . Coronary angioplasty  2008  . Eye surgery Bilateral last 3 years ago     laser surgery x2 each- relieves pressure  . Back surgery      lower  . Total knee arthroplasty Left 12/03/2013    Procedure: LEFT TOTAL KNEE ARTHROPLASTY;  Surgeon: Jacki Conesonald A Gioffre, MD;  Location: [facility];  Service: Orthopedics;  Laterality: Left;    Current Outpatient Prescriptions  Medication Sig Dispense Refill  . aspirin EC 81 MG tablet Take 81 mg by mouth daily.    .  chlordiazePOXIDE (LIBRIUM) 25 MG capsule Take one capsule by mouth once daily 30 capsule 0  . esomeprazole (NEXIUM) 40 MG capsule Take 40 mg by mouth daily as needed (indegistion.).     Marland Kitchen. lisinopril (PRINIVIL,ZESTRIL) 10 MG tablet Take 10 mg by mouth every morning.    . naproxen (NAPROSYN) 500 MG tablet Take 500 mg by mouth daily.   1  . naproxen sodium (ANAPROX) 220 MG tablet Take 220 mg by mouth daily as needed (pain.).    Marland Kitchen. propranolol ER (INDERAL LA) 60 MG 24 hr capsule Take 60 mg by mouth every morning.    Marland Kitchen.    0  . simvastatin (ZOCOR) 40 MG tablet Take 40 mg by mouth every morning.    . travoprost, benzalkonium, (TRAVATAN) 0.004 % ophthalmic solution Place 1 drop into both eyes as needed.      No current facility-administered medications for this visit.   Facility-Administered Medications Ordered in Other Visits  Medication Dose Route Frequency Provider Last Rate Last Dose  . dexamethasone (DECADRON) injection 10 mg  10 mg Intravenous Once Marriottmber Constable, PA-C      . tranexamic acid (CYKLOKAPRON) 2,000 mg in sodium chloride 0.9 % 50 mL Topical Application  2,000 mg  Topical Once Marriott, PA-C        No Known Allergies  History   Social History  . Marital Status: Divorced    Spouse Name: N/A  . Number of Children: N/A  . Years of Education: N/A   Occupational History  . retired    Social History Main Topics  . Smoking status: Former Smoker    Types: Cigarettes    Quit date: 07/27/1980  . Smokeless tobacco: Never Used  . Alcohol Use: Yes     Comment: occasional  . Drug Use: No  . Sexual Activity: Not on file   Other Topics Concern  . Not on file   Social History Narrative    Family History  Problem Relation Age of Onset  . Dementia Mother   . Heart failure Father     Review of Systems:  As stated in the HPI and otherwise negative.   BP 126/60 mmHg  Pulse 70  Ht  (1.727 m)  Wt 227 lb 12.8 oz (103.329 kg)  BMI 34.64 kg/m2  Physical  Examination: General: Well developed, well nourished, NAD HEENT: OP clear, mucus membranes moist SKIN: warm, dry. No rashes. Neuro: No focal deficits Musculoskeletal: Muscle strength 5/5 all ext Psychiatric: Mood and affect normal Neck: No JVD, no carotid bruits, no thyromegaly, no lymphadenopathy. Lungs:Clear bilaterally, no wheezes, rhonci, crackles Cardiovascular: Regular rate and rhythm. No murmurs, gallops or rubs. Abdomen:Soft. Bowel sounds present. Non-tender.  Extremities: No lower extremity edema. Pulses are 2 + in the bilateral DP/PT.  Assessment and Plan:   1. CAD: Stable.  Will continue current meds. We discussed adding Plavix to his ASA therapy based on the DAPT trial and the fact that he has a first generation Taxus DES but he refused as he had excessive bruising and bleeding on Plavix in the past.   2. HTN: BP controlled. No changes today.   3. Hyperlipidemia: He is on a statin. Lipids followed in primary care.

## 2014-06-05 ENCOUNTER — Other Ambulatory Visit: Payer: Self-pay | Admitting: Surgical

## 2014-06-05 NOTE — Care Management (Signed)
Pre-surgical orders entered 06/05/2014 at 8:35am

## 2014-06-17 NOTE — Patient Instructions (Addendum)
[name] PlumberDavid [name]  06/17/2014   Your procedure is scheduled on:   06/23/2014    [facility]  [facility] and follow signs to               Short Stay Center at     0915 AM.  Call this number if you have problems the morning of surgery [phone]   Remember:  Do not eat food or drink liquids :After Midnight.     Take these medicines the morning of surgery with A SIP OF WATER:   Nexium if needed, Propanolol, Travatan eye drops if needed , Librium                                You may not have any metal on your body including hair pins and              piercings  Do not wear jewelry, , lotions, powders or perfumes., deodorant.                          Men may shave face and neck.   Do not bring valuables to the hospital. [facility] IS NOT             RESPONSIBLE   FOR VALUABLES.  Contacts, dentures or bridgework may not be worn into surgery.  Leave suitcase in the car. After surgery it may be brought to your room.     .  :  Special Instructions: coughing and deep breathing exercises, leg exercises               Please read over the following fact sheets you were given: _____________________________________________________________________             [facility] - Preparing for [facility] Before surgery, you can play an important role.  Because skin is not sterile, your skin needs to be as free of germs as possible.  You can reduce the number of germs on your skin by washing with CHG (chlorahexidine gluconate) soap before surgery.  CHG is an antiseptic cleaner which kills germs and bonds with the skin to continue killing germs even after washing. Please DO NOT use if you have an allergy to CHG or antibacterial soaps.  If your skin becomes reddened/irritated stop using the CHG and inform your nurse when you arrive at Short Stay. Do not shave (including legs and underarms) for at least 48 hours prior to the first CHG shower.  You may shave your  face/neck. Please follow these instructions carefully:  1.  Shower with CHG Soap the night before surgery and the  morning of Surgery.  2.  If you choose to wash your hair, wash your hair first as usual with your  normal  shampoo.  3.  After you shampoo, rinse your hair and body thoroughly to remove the  shampoo.                           4.  Use CHG as you would any other liquid soap.  You can apply chg directly  to the skin and wash                       Gently with a scrungie or clean washcloth.  5.  Apply the CHG Soap to your body ONLY FROM THE NECK DOWN.   Do not use on face/ open                           Wound or open sores. Avoid contact with eyes, ears mouth and genitals (private parts).                       Wash face,  Genitals (private parts) with your normal soap.             6.  Wash thoroughly, paying special attention to the area where your surgery  will be performed.  7.  Thoroughly rinse your body with warm water from the neck down.  8.  DO NOT shower/wash with your normal soap after using and rinsing off  the CHG Soap.                9.  Pat yourself dry with a clean towel.            10.  Wear clean pajamas.            11.  Place clean sheets on your bed the night of your first shower and do not  sleep with pets. Day of Surgery : Do not apply any lotions/deodorants the morning of surgery.  Please wear clean clothes to the hospital/surgery center.  FAILURE TO FOLLOW THESE INSTRUCTIONS MAY RESULT IN THE CANCELLATION OF YOUR SURGERY PATIENT SIGNATURE_________________________________  NURSE SIGNATURE__________________________________  ________________________________________________________________________  WHAT IS A BLOOD TRANSFUSION? Blood Transfusion Information  A transfusion is the replacement of blood or some of its parts. Blood is made up of multiple cells which provide different functions.  Red blood cells carry oxygen and are used for blood loss  replacement.  White blood cells fight against infection.  Platelets control bleeding.  Plasma helps clot blood.  Other blood products are available for specialized needs, such as hemophilia or other clotting disorders. BEFORE THE TRANSFUSION  Who gives blood for transfusions?   Healthy volunteers who are fully evaluated to make sure their blood is safe. This is blood bank blood. Transfusion therapy is the safest it has ever been in the practice of medicine. Before blood is taken from a donor, a complete history is taken to make sure that person has no history of diseases nor engages in risky social behavior (examples are intravenous drug use or sexual activity with multiple partners). The donor's travel history is screened to minimize risk of transmitting infections, such as malaria. The donated blood is tested for signs of infectious diseases, such as HIV and hepatitis. The blood is then tested to be sure it is compatible with you in order to minimize the chance of a transfusion reaction. If you or a relative donates blood, this is often done in anticipation of surgery and is not appropriate for emergency situations. It takes many days to process the donated blood. RISKS AND COMPLICATIONS Although transfusion therapy is very safe and saves many lives, the main dangers of transfusion include:  1. Getting an infectious disease. 2. Developing a transfusion reaction. This is an allergic reaction to something in the blood you were given. Every precaution is taken to prevent this. The decision to have a blood transfusion has been considered carefully by your caregiver before blood is given. Blood is not given unless the benefits outweigh the risks. AFTER THE TRANSFUSION  Right after  receiving a blood transfusion, you will usually feel much better and more energetic. This is especially true if your red blood cells have gotten low (anemic). The transfusion raises the level of the red blood cells which  carry oxygen, and this usually causes an energy increase.  The nurse administering the transfusion will monitor you carefully for complications. HOME CARE INSTRUCTIONS  No special instructions are needed after a transfusion. You may find your energy is better. Speak with your caregiver about any limitations on activity for underlying diseases you may have. SEEK MEDICAL CARE IF:   Your condition is not improving after your transfusion.  You develop redness or irritation at the intravenous (IV) site. SEEK IMMEDIATE MEDICAL CARE IF:  Any of the following symptoms occur over the next 12 hours:  Shaking chills.  You have a temperature by mouth above 102 F (38.9 C), not controlled by medicine.  Chest, back, or muscle pain.  People around you feel you are not acting correctly or are confused.  Shortness of breath or difficulty breathing.  Dizziness and fainting.  You get a rash or develop hives.  You have a decrease in urine output.  Your urine turns a dark color or changes to pink, red, or brown. Any of the following symptoms occur over the next 10 days:  You have a temperature by mouth above 102 F (38.9 C), not controlled by medicine.  Shortness of breath.  Weakness after normal activity.  The white part of the eye turns yellow (jaundice).  You have a decrease in the amount of urine or are urinating less often.  Your urine turns a dark color or changes to pink, red, or brown. Document Released: 02/18/2000 Document Revised: 05/15/2011 Document Reviewed: 10/07/2007 ExitCare Patient Information 2014 [place].  _______________________________________________________________________  Incentive Spirometer  An incentive spirometer is a tool that can help keep your lungs clear and active. This tool measures how well you are filling your lungs with each breath. Taking long deep breaths may help reverse or decrease the chance of developing breathing (pulmonary) problems  (especially infection) following:  A long period of time when you are unable to move or be active. BEFORE THE PROCEDURE   If the spirometer includes an indicator to show your best effort, your nurse or respiratory therapist will set it to a desired goal.  If possible, sit up straight or lean slightly forward. Try not to slouch.  Hold the incentive spirometer in an upright position. INSTRUCTIONS FOR USE  3. Sit on the edge of your bed if possible, or sit up as far as you can in bed or on a chair. 4. Hold the incentive spirometer in an upright position. 5. Breathe out normally. 6. Place the mouthpiece in your mouth and seal your lips tightly around it. 7. Breathe in slowly and as deeply as possible, raising the piston or the ball toward the top of the column. 8. Hold your breath for 3-5 seconds or for as long as possible. Allow the piston or ball to fall to the bottom of the column. 9. Remove the mouthpiece from your mouth and breathe out normally. 10. Rest for a few seconds and repeat Steps 1 through 7 at least 10 times every 1-2 hours when you are awake. Take your time and take a few normal breaths between deep breaths. 11. The spirometer may include an indicator to show your best effort. Use the indicator as a goal to work toward during each repetition. 12. After each set  of 10 deep breaths, practice coughing to be sure your lungs are clear. If you have an incision (the cut made at the time of surgery), support your incision when coughing by placing a pillow or rolled up towels firmly against it. Once you are able to get out of bed, walk around indoors and cough well. You may stop using the incentive spirometer when instructed by your caregiver.  RISKS AND COMPLICATIONS  Take your time so you do not get dizzy or light-headed.  If you are in pain, you may need to take or ask for pain medication before doing incentive spirometry. It is harder to take a deep breath if you are having  pain. AFTER USE  Rest and breathe slowly and easily.  It can be helpful to keep track of a log of your progress. Your caregiver can provide you with a simple table to help with this. If you are using the spirometer at home, follow these instructions: [place] IF:   You are having difficultly using the spirometer.  You have trouble using the spirometer as often as instructed.  Your pain medication is not giving enough relief while using the spirometer.  You develop fever of 100.5 F (38.1 C) or higher. SEEK IMMEDIATE MEDICAL CARE IF:   You cough up bloody sputum that had not been present before.  You develop fever of 102 F (38.9 C) or greater.  You develop worsening pain at or near the incision site. MAKE SURE YOU:   Understand these instructions.  Will watch your condition.  Will get help right away if you are not doing well or get worse. Document Released: 07/03/2006 Document Revised: 05/15/2011 Document Reviewed: 09/03/2006 [facility] Patient Information 2014 [place], [place].   ________________________________________________________________________

## 2014-06-17 NOTE — Progress Notes (Signed)
Ov Dr Fonda KinderMchalney 3/15 epic ekg and chest x ray 9/15 epic Clearance Dr Sanjuana KavaMcalhaney chart

## 2014-06-17 NOTE — H&P (Signed)
TOTAL KNEE ADMISSION H&P  Patient is being admitted for right total knee arthroplasty.  Subjective:  Chief Complaint:right knee pain.  HPI: [name] [name], 74 y.o. male, has a history of pain and functional disability in the right knee due to arthritis and has failed non-surgical conservative treatments for greater than 12 weeks to includeNSAID's and/or analgesics, corticosteriod injections and activity modification.  Onset of symptoms was gradual, starting >10 years ago with gradually worsening course since that time. The patient noted no past surgery on the right knee(s).  Patient currently rates pain in the right knee(s) at 7 out of 10 with activity. Patient has night pain, worsening of pain with activity and weight bearing, pain that interferes with activities of daily living, pain with passive range of motion, crepitus and joint swelling.  Patient has evidence of periarticular osteophytes and joint space narrowing by imaging studies. There is no active infection.  Patient Active Problem List   Diagnosis Date Noted  . Tremors of nervous system 12/08/2013  . Osteoarthritis of left knee 12/03/2013  . Total knee replacement status 12/03/2013  . CAD, NATIVE VESSEL 06/30/2008  . Hyperlipidemia 06/24/2008  . HYPERTENSION, CONTROLLED 06/24/2008   Past Medical History  Diagnosis Date  . Coronary artery disease     status post non-ST-elevation myocardial   infarction in May 2008 with treatment of a diagonal branch lesion  with a Taxus drug-eluting stent.  . Hyperlipidemia     with low HDL, improved on simvastatin.  Marland Kitchen. Hypertension   . Tremors of nervous system   . Myocardial infarction 2008  . GERD (gastroesophageal reflux disease)     rare, mild  . Glaucoma   . Arthritis     knees    Past Surgical History  Procedure Laterality Date  . Knee surgery Bilateral   . Shoulder surgery Bilateral     rotator cuff repair  . Hand surgery Right     replacement of 2 knuckles  . Elbow surgery  Right     tendon surgery  . Cardiac catheterization  2008  . Coronary angioplasty  2008  . Eye surgery Bilateral last 3 years ago     laser surgery x2 each- relieves pressure  . Back surgery      lower  . Total knee arthroplasty Left 12/03/2013    Procedure: LEFT TOTAL KNEE ARTHROPLASTY;  Surgeon: Jacki Conesonald A Gioffre, MD;  Location: [facility] ORS;  Service: Orthopedics;  Laterality: Left;      Current outpatient prescriptions:  .  aspirin EC 81 MG tablet, Take 81 mg by mouth daily., Disp: , Rfl:  .  chlordiazePOXIDE (LIBRIUM) 25 MG capsule, Take one capsule by mouth once daily, Disp: 30 capsule, Rfl: 0 .  esomeprazole (NEXIUM) 40 MG capsule, Take 40 mg by mouth daily as needed (indegistion.). , Disp: , Rfl:  .  lisinopril (PRINIVIL,ZESTRIL) 10 MG tablet, Take 10 mg by mouth every morning., Disp: , Rfl:  .  naproxen sodium (ANAPROX) 220 MG tablet, Take 220 mg by mouth daily as needed (pain.)., Disp: , Rfl:  .  propranolol ER (INDERAL LA) 60 MG 24 hr capsule, Take 60 mg by mouth every morning., Disp: , Rfl:  .  simvastatin (ZOCOR) 40 MG tablet, Take 40 mg by mouth every morning., Disp: , Rfl:  .  travoprost, benzalkonium, (TRAVATAN) 0.004 % ophthalmic solution, Place 1 drop into both eyes daily as needed (eye irritation). , Disp: , Rfl:    No Known Allergies  History  Substance Use Topics  .  Smoking status: Former Smoker    Types: Cigarettes    Quit date: 07/27/1980  . Smokeless tobacco: Never Used  . Alcohol Use: Yes     Comment: occasional    Family History  Problem Relation Age of Onset  . Dementia Mother   . Heart failure Father      Review of Systems  Constitutional: Negative.   HENT: Negative.   Eyes: Negative.   Respiratory: Positive for shortness of breath. Negative for cough, hemoptysis, sputum production and wheezing.        SOB with exertion  Cardiovascular: Negative.   Gastrointestinal: Negative.   Genitourinary: Negative.   Musculoskeletal: Positive for joint pain.  Negative for myalgias, back pain, falls and neck pain.       Right knee pain  Skin: Negative.   Neurological: Positive for tremors. Negative for dizziness, tingling, sensory change, speech change, focal weakness, seizures and loss of consciousness.  Endo/Heme/Allergies: Negative.   Psychiatric/Behavioral: Negative.     Objective:  Physical Exam  Constitutional: He is oriented to person, place, and time. He appears well-developed. No distress.  Obese  HENT:  Head: Normocephalic and atraumatic.  Right Ear: External ear normal.  Left Ear: External ear normal.  Nose: Nose normal.  Mouth/Throat: Oropharynx is clear and moist.  Eyes: Conjunctivae and EOM are normal.  Neck: Normal range of motion. Neck supple.  Cardiovascular: Normal rate, regular rhythm, normal heart sounds and intact distal pulses.   No murmur heard. Respiratory: Effort normal and breath sounds normal. No respiratory distress. He has no wheezes.  GI: Soft. Bowel sounds are normal. He exhibits no distension. There is no tenderness.  Musculoskeletal:       Right hip: Normal.       Left hip: Normal.       Right knee: He exhibits decreased range of motion and swelling. He exhibits no effusion and no erythema. Tenderness found. Medial joint line and lateral joint line tenderness noted.       Left knee: Normal.  Incision over left total knee healed with no signs of infection  Neurological: He is alert and oriented to person, place, and time. He has normal strength and normal reflexes. No sensory deficit.  Skin: No rash noted. He is not diaphoretic. No erythema.  Psychiatric: He has a normal mood and affect. His behavior is normal.    Vitals  Weight: 227 lb Height: 68in Body Surface Area: 2.16 m Body Mass Index: 34.51 kg/m  Pulse: 64 (Regular)  BP: 112/70 (Sitting, Left Arm, Standard)  Imaging Review Plain radiographs demonstrate severe degenerative joint disease of the right knee(s). The overall alignment  issignificant varus. The bone quality appears to be good for age and reported activity level.  Assessment/Plan:  End stage arthritis, right knee   The patient history, physical examination, clinical judgment of the provider and imaging studies are consistent with end stage degenerative joint disease of the right knee(s) and total knee arthroplasty is deemed medically necessary. The treatment options including medical management, injection therapy arthroscopy and arthroplasty were discussed at length. The risks and benefits of total knee arthroplasty were presented and reviewed. The risks due to aseptic loosening, infection, stiffness, patella tracking problems, thromboembolic complications and other imponderables were discussed. The patient acknowledged the explanation, agreed to proceed with the plan and consent was signed. Patient is being admitted for inpatient treatment for surgery, pain control, PT, OT, prophylactic antibiotics, VTE prophylaxis, progressive ambulation and ADL's and discharge planning. The patient is planning to  be discharged to skilled nursing facility [facility])   Topical TXA PCP: Dr. Anner Crete Cardio: Dr. Lindley Magnus, PA-C

## 2014-06-18 ENCOUNTER — Encounter (HOSPITAL_COMMUNITY): Payer: Self-pay

## 2014-06-18 ENCOUNTER — Encounter (HOSPITAL_COMMUNITY)
Admission: RE | Admit: 2014-06-18 | Discharge: 2014-06-18 | Disposition: A | Discharge status: Home / Self Care | Payer: Medicare PPO | Source: Ambulatory Visit | Attending: Orthopedic Surgery | Admitting: Orthopedic Surgery

## 2014-06-18 DIAGNOSIS — Z01812 Encounter for preprocedural laboratory examination: Secondary | ICD-10-CM | POA: Insufficient documentation

## 2014-06-18 DIAGNOSIS — Z7982 Long term (current) use of aspirin: Secondary | ICD-10-CM | POA: Insufficient documentation

## 2014-06-18 DIAGNOSIS — Z87891 Personal history of nicotine dependence: Secondary | ICD-10-CM | POA: Insufficient documentation

## 2014-06-18 DIAGNOSIS — M1711 Unilateral primary osteoarthritis, right knee: Secondary | ICD-10-CM | POA: Insufficient documentation

## 2014-06-18 DIAGNOSIS — Z96652 Presence of left artificial knee joint: Secondary | ICD-10-CM | POA: Insufficient documentation

## 2014-06-18 DIAGNOSIS — I2510 Atherosclerotic heart disease of native coronary artery without angina pectoris: Secondary | ICD-10-CM | POA: Insufficient documentation

## 2014-06-18 DIAGNOSIS — I10 Essential (primary) hypertension: Secondary | ICD-10-CM | POA: Insufficient documentation

## 2014-06-18 DIAGNOSIS — E785 Hyperlipidemia, unspecified: Secondary | ICD-10-CM | POA: Insufficient documentation

## 2014-06-18 LAB — CBC WITH DIFFERENTIAL/PLATELET
Basophils Absolute: 0 10*3/uL (ref 0.0–0.1)
Basophils Relative: 0 % (ref 0–1)
Eosinophils Absolute: 0.1 10*3/uL (ref 0.0–0.7)
Eosinophils Relative: 2 % (ref 0–5)
HCT: 44.9 % (ref 39.0–52.0)
Hemoglobin: 14.4 g/dL (ref 13.0–17.0)
Lymphocytes Relative: 37 % (ref 12–46)
Lymphs Abs: 2.4 10*3/uL (ref 0.7–4.0)
MCH: 30 pg (ref 26.0–34.0)
MCHC: 32.1 g/dL (ref 30.0–36.0)
MCV: 93.5 fL (ref 78.0–100.0)
Monocytes Absolute: 0.8 10*3/uL (ref 0.1–1.0)
Monocytes Relative: 13 % — ABNORMAL HIGH (ref 3–12)
Neutro Abs: 3.2 10*3/uL (ref 1.7–7.7)
Neutrophils Relative %: 48 % (ref 43–77)
Platelets: 190 10*3/uL (ref 150–400)
RBC: 4.8 MIL/uL (ref 4.22–5.81)
RDW: 14 % (ref 11.5–15.5)
WBC: 6.6 10*3/uL (ref 4.0–10.5)

## 2014-06-18 LAB — URINALYSIS, ROUTINE W REFLEX MICROSCOPIC
Bilirubin Urine: NEGATIVE
Glucose, UA: NEGATIVE mg/dL
Hgb urine dipstick: NEGATIVE
Ketones, ur: NEGATIVE mg/dL
Leukocytes, UA: NEGATIVE
Nitrite: NEGATIVE
Protein, ur: NEGATIVE mg/dL
Specific Gravity, Urine: 1.028 (ref 1.005–1.030)
Urobilinogen, UA: 0.2 mg/dL (ref 0.0–1.0)
pH: 5.5 (ref 5.0–8.0)

## 2014-06-18 LAB — SURGICAL PCR SCREEN
MRSA, PCR: NEGATIVE
Staphylococcus aureus: NEGATIVE

## 2014-06-18 LAB — APTT: aPTT: 32 seconds (ref 24–37)

## 2014-06-18 LAB — COMPREHENSIVE METABOLIC PANEL
ALT: 15 U/L (ref 0–53)
AST: 26 U/L (ref 0–37)
Albumin: 4.4 g/dL (ref 3.5–5.2)
Alkaline Phosphatase: 102 U/L (ref 39–117)
Anion gap: 5 (ref 5–15)
BUN: 18 mg/dL (ref 6–23)
CO2: 28 mmol/L (ref 19–32)
Calcium: 8.7 mg/dL (ref 8.4–10.5)
Chloride: 106 mmol/L (ref 96–112)
Creatinine, Ser: 0.92 mg/dL (ref 0.50–1.35)
GFR calc Af Amer: >90 mL/min (ref >90)
GFR calc non Af Amer: 83 mL/min — ABNORMAL LOW (ref >90)
Glucose, Bld: 150 mg/dL — ABNORMAL HIGH (ref 70–99)
Potassium: 4.1 mmol/L (ref 3.5–5.1)
Sodium: 139 mmol/L (ref 135–145)
Total Bilirubin: 0.9 mg/dL (ref 0.3–1.2)
Total Protein: 7.1 g/dL (ref 6.0–8.3)

## 2014-06-18 LAB — PROTIME-INR
INR: 1.01 (ref 0.00–1.49)
Prothrombin Time: 13.4 seconds (ref 11.6–15.2)

## 2014-06-18 NOTE — Progress Notes (Signed)
EKG- 11/18/13 EPIC  EXR- 11/27/13 EPIC  05/26[facility]  Surgical clearance on chart - 05/26/14

## 2014-06-23 ENCOUNTER — Inpatient Hospital Stay (HOSPITAL_COMMUNITY): Payer: Medicare PPO | Admitting: Certified Registered Nurse Anesthetist

## 2014-06-23 ENCOUNTER — Encounter (HOSPITAL_COMMUNITY): Payer: Self-pay | Admitting: *Deleted

## 2014-06-23 ENCOUNTER — Inpatient Hospital Stay (HOSPITAL_COMMUNITY)
Admission: RE | Admit: 2014-06-23 | Discharge: 2014-06-25 | DRG: 470 | Disposition: A | Discharge status: Skilled Nursing Facility | Payer: Medicare PPO | Source: Ambulatory Visit | Attending: Orthopedic Surgery | Admitting: Orthopedic Surgery

## 2014-06-23 ENCOUNTER — Encounter (HOSPITAL_COMMUNITY)
Admission: RE | Disposition: A | Discharge status: Skilled Nursing Facility | Payer: Self-pay | Source: Ambulatory Visit | Attending: Orthopedic Surgery

## 2014-06-23 DIAGNOSIS — M25561 Pain in right knee: Secondary | ICD-10-CM | POA: Diagnosis present

## 2014-06-23 DIAGNOSIS — I10 Essential (primary) hypertension: Secondary | ICD-10-CM | POA: Diagnosis present

## 2014-06-23 DIAGNOSIS — Z955 Presence of coronary angioplasty implant and graft: Secondary | ICD-10-CM | POA: Diagnosis not present

## 2014-06-23 DIAGNOSIS — Z79899 Other long term (current) drug therapy: Secondary | ICD-10-CM | POA: Diagnosis not present

## 2014-06-23 DIAGNOSIS — M24561 Contracture, right knee: Secondary | ICD-10-CM | POA: Diagnosis present

## 2014-06-23 DIAGNOSIS — Z87891 Personal history of nicotine dependence: Secondary | ICD-10-CM

## 2014-06-23 DIAGNOSIS — Z96652 Presence of left artificial knee joint: Secondary | ICD-10-CM | POA: Diagnosis present

## 2014-06-23 DIAGNOSIS — Z01812 Encounter for preprocedural laboratory examination: Secondary | ICD-10-CM | POA: Diagnosis not present

## 2014-06-23 DIAGNOSIS — Z96659 Presence of unspecified artificial knee joint: Secondary | ICD-10-CM

## 2014-06-23 DIAGNOSIS — Z7982 Long term (current) use of aspirin: Secondary | ICD-10-CM | POA: Diagnosis not present

## 2014-06-23 DIAGNOSIS — M1711 Unilateral primary osteoarthritis, right knee: Secondary | ICD-10-CM | POA: Diagnosis present

## 2014-06-23 DIAGNOSIS — I2510 Atherosclerotic heart disease of native coronary artery without angina pectoris: Secondary | ICD-10-CM | POA: Diagnosis present

## 2014-06-23 DIAGNOSIS — I252 Old myocardial infarction: Secondary | ICD-10-CM | POA: Diagnosis not present

## 2014-06-23 DIAGNOSIS — K219 Gastro-esophageal reflux disease without esophagitis: Secondary | ICD-10-CM | POA: Diagnosis present

## 2014-06-23 DIAGNOSIS — E785 Hyperlipidemia, unspecified: Secondary | ICD-10-CM | POA: Diagnosis present

## 2014-06-23 HISTORY — PX: TOTAL KNEE ARTHROPLASTY: SHX125

## 2014-06-23 LAB — TYPE AND SCREEN
ABO/RH(D): A POS
Antibody Screen: NEGATIVE

## 2014-06-23 SURGERY — ARTHROPLASTY, KNEE, TOTAL
Anesthesia: General | Site: Knee | Laterality: Right

## 2014-06-23 MED ORDER — TRANEXAMIC ACID 100 MG/ML IV SOLN
2000.0000 mg | Freq: Once | INTRAVENOUS | Status: DC
  Filled 2014-06-23: qty 20

## 2014-06-23 MED ORDER — LACTATED RINGERS IV SOLN: INTRAVENOUS | Status: DC

## 2014-06-23 MED ORDER — METHOCARBAMOL 1000 MG/10ML IJ SOLN
500.0000 mg | Freq: Four times a day (QID) | INTRAMUSCULAR | Status: DC | PRN
  Administered 2014-06-23: 500 mg via INTRAVENOUS
  Filled 2014-06-23 (×2): qty 5

## 2014-06-23 MED ORDER — PHENOL 1.4 % MT LIQD: 1.0000 | OROMUCOSAL | Status: DC | PRN

## 2014-06-23 MED ORDER — ONDANSETRON HCL 4 MG/2ML IJ SOLN: 4.0000 mg | Freq: Four times a day (QID) | INTRAMUSCULAR | Status: DC | PRN

## 2014-06-23 MED ORDER — POLYETHYLENE GLYCOL 3350 17 G PO PACK: 17.0000 g | PACK | Freq: Every day | ORAL | Status: DC | PRN

## 2014-06-23 MED ORDER — PROPOFOL 10 MG/ML IV BOLUS
INTRAVENOUS | Status: DC | PRN
  Administered 2014-06-23: 200 mg via INTRAVENOUS

## 2014-06-23 MED ORDER — SODIUM CHLORIDE 0.9 % IR SOLN
Status: AC
  Filled 2014-06-23: qty 1

## 2014-06-23 MED ORDER — TRAVOPROST 0.004 % OP SOLN: 1.0000 [drp] | Freq: Every day | OPHTHALMIC | Status: DC | PRN

## 2014-06-23 MED ORDER — THROMBIN 5000 UNITS EX SOLR
CUTANEOUS | Status: AC
  Filled 2014-06-23: qty 5000

## 2014-06-23 MED ORDER — BUPIVACAINE HCL (PF) 0.25 % IJ SOLN
INTRAMUSCULAR | Status: AC
  Filled 2014-06-23: qty 30

## 2014-06-23 MED ORDER — GELATIN ABSORBABLE MT POWD
OROMUCOSAL | Status: DC | PRN
  Administered 2014-06-23: 5 mL via TOPICAL

## 2014-06-23 MED ORDER — PANTOPRAZOLE SODIUM 40 MG PO TBEC
40.0000 mg | DELAYED_RELEASE_TABLET | Freq: Every day | ORAL | Status: DC
  Administered 2014-06-23 – 2014-06-25 (×3): 40 mg via ORAL
  Filled 2014-06-23 (×3): qty 1

## 2014-06-23 MED ORDER — CHLORHEXIDINE GLUCONATE 4 % EX LIQD: 60.0000 mL | Freq: Once | CUTANEOUS | Status: DC

## 2014-06-23 MED ORDER — METHOCARBAMOL 500 MG PO TABS
500.0000 mg | ORAL_TABLET | Freq: Four times a day (QID) | ORAL | Status: DC | PRN
  Administered 2014-06-24 (×3): 500 mg via ORAL
  Filled 2014-06-23 (×3): qty 1

## 2014-06-23 MED ORDER — RIVAROXABAN 10 MG PO TABS
10.0000 mg | ORAL_TABLET | Freq: Every day | ORAL | Status: DC
  Administered 2014-06-24 – 2014-06-25 (×2): 10 mg via ORAL
  Filled 2014-06-23 (×3): qty 1

## 2014-06-23 MED ORDER — ALUM & MAG HYDROXIDE-SIMETH 200-200-20 MG/5ML PO SUSP
30.0000 mL | ORAL | Status: DC | PRN
  Administered 2014-06-23: 30 mL via ORAL
  Filled 2014-06-23: qty 30

## 2014-06-23 MED ORDER — SODIUM CHLORIDE 0.9 % IJ SOLN
INTRAMUSCULAR | Status: AC
  Filled 2014-06-23: qty 50

## 2014-06-23 MED ORDER — ONDANSETRON HCL 4 MG/2ML IJ SOLN
INTRAMUSCULAR | Status: AC
  Filled 2014-06-23: qty 2

## 2014-06-23 MED ORDER — HYDROMORPHONE HCL 2 MG PO TABS
2.0000 mg | ORAL_TABLET | ORAL | Status: DC | PRN
  Administered 2014-06-23 – 2014-06-24 (×2): 2 mg via ORAL
  Filled 2014-06-23 (×2): qty 1

## 2014-06-23 MED ORDER — BISACODYL 5 MG PO TBEC: 5.0000 mg | DELAYED_RELEASE_TABLET | Freq: Every day | ORAL | Status: DC | PRN

## 2014-06-23 MED ORDER — HYDROMORPHONE HCL 1 MG/ML IJ SOLN
INTRAMUSCULAR | Status: AC
  Filled 2014-06-23: qty 1

## 2014-06-23 MED ORDER — SIMVASTATIN 40 MG PO TABS
40.0000 mg | ORAL_TABLET | Freq: Every morning | ORAL | Status: DC
  Administered 2014-06-23 – 2014-06-24 (×2): 40 mg via ORAL
  Filled 2014-06-23 (×3): qty 1

## 2014-06-23 MED ORDER — HYDROMORPHONE HCL 1 MG/ML IJ SOLN
0.2500 mg | INTRAMUSCULAR | Status: DC | PRN
  Administered 2014-06-23: 0.25 mg via INTRAVENOUS

## 2014-06-23 MED ORDER — HYDROMORPHONE HCL 2 MG/ML IJ SOLN
INTRAMUSCULAR | Status: AC
  Filled 2014-06-23: qty 1

## 2014-06-23 MED ORDER — FENTANYL CITRATE (PF) 100 MCG/2ML IJ SOLN
INTRAMUSCULAR | Status: DC | PRN
  Administered 2014-06-23: 100 ug via INTRAVENOUS
  Administered 2014-06-23 (×3): 50 ug via INTRAVENOUS

## 2014-06-23 MED ORDER — MIDAZOLAM HCL 2 MG/2ML IJ SOLN
INTRAMUSCULAR | Status: AC
  Filled 2014-06-23: qty 2

## 2014-06-23 MED ORDER — ACETAMINOPHEN 650 MG RE SUPP: 650.0000 mg | Freq: Four times a day (QID) | RECTAL | Status: DC | PRN

## 2014-06-23 MED ORDER — BUPIVACAINE LIPOSOME 1.3 % IJ SUSP
20.0000 mL | Freq: Once | INTRAMUSCULAR | Status: DC
  Filled 2014-06-23: qty 20

## 2014-06-23 MED ORDER — PROPOFOL 10 MG/ML IV BOLUS
INTRAVENOUS | Status: AC
  Filled 2014-06-23: qty 20

## 2014-06-23 MED ORDER — MIDAZOLAM HCL 5 MG/5ML IJ SOLN
INTRAMUSCULAR | Status: DC | PRN
  Administered 2014-06-23: 2 mg via INTRAVENOUS

## 2014-06-23 MED ORDER — LACTATED RINGERS IV SOLN
INTRAVENOUS | Status: DC
  Administered 2014-06-23: 16:00:00 via INTRAVENOUS
  Administered 2014-06-23: 1000 mL via INTRAVENOUS

## 2014-06-23 MED ORDER — ONDANSETRON HCL 4 MG PO TABS: 4.0000 mg | ORAL_TABLET | Freq: Four times a day (QID) | ORAL | Status: DC | PRN

## 2014-06-23 MED ORDER — SODIUM CHLORIDE 0.9 % IJ SOLN
INTRAMUSCULAR | Status: DC | PRN
  Administered 2014-06-23: 20 mL

## 2014-06-23 MED ORDER — LIDOCAINE HCL (CARDIAC) 20 MG/ML IV SOLN
INTRAVENOUS | Status: DC | PRN
  Administered 2014-06-23: 50 mg via INTRAVENOUS

## 2014-06-23 MED ORDER — CEFAZOLIN SODIUM-DEXTROSE 2-3 GM-% IV SOLR
2.0000 g | INTRAVENOUS | Status: AC
  Administered 2014-06-23: 2 g via INTRAVENOUS

## 2014-06-23 MED ORDER — LIDOCAINE HCL (CARDIAC) 20 MG/ML IV SOLN
INTRAVENOUS | Status: AC
  Filled 2014-06-23: qty 5

## 2014-06-23 MED ORDER — BUPIVACAINE HCL (PF) 0.25 % IJ SOLN
INTRAMUSCULAR | Status: DC | PRN
  Administered 2014-06-23: 20 mL

## 2014-06-23 MED ORDER — BUPIVACAINE LIPOSOME 1.3 % IJ SUSP
INTRAMUSCULAR | Status: DC | PRN
  Administered 2014-06-23: 20 mL

## 2014-06-23 MED ORDER — ACETAMINOPHEN 325 MG PO TABS: 650.0000 mg | ORAL_TABLET | Freq: Four times a day (QID) | ORAL | Status: DC | PRN

## 2014-06-23 MED ORDER — LATANOPROST 0.005 % OP SOLN
1.0000 [drp] | Freq: Every day | OPHTHALMIC | Status: DC
  Filled 2014-06-23: qty 2.5

## 2014-06-23 MED ORDER — CEFAZOLIN SODIUM-DEXTROSE 2-3 GM-% IV SOLR
INTRAVENOUS | Status: AC
  Filled 2014-06-23: qty 50

## 2014-06-23 MED ORDER — HYDROMORPHONE HCL 1 MG/ML IJ SOLN
1.0000 mg | INTRAMUSCULAR | Status: DC | PRN
Start: 2014-06-23 — End: 2014-06-25

## 2014-06-23 MED ORDER — MENTHOL 3 MG MT LOZG
1.0000 | LOZENGE | OROMUCOSAL | Status: DC | PRN
  Filled 2014-06-23 (×2): qty 9

## 2014-06-23 MED ORDER — FERROUS SULFATE 325 (65 FE) MG PO TABS
325.0000 mg | ORAL_TABLET | Freq: Three times a day (TID) | ORAL | Status: DC
  Administered 2014-06-24 – 2014-06-25 (×4): 325 mg via ORAL
  Filled 2014-06-23 (×7): qty 1

## 2014-06-23 MED ORDER — CELECOXIB 200 MG PO CAPS
200.0000 mg | ORAL_CAPSULE | Freq: Two times a day (BID) | ORAL | Status: DC
  Administered 2014-06-23 – 2014-06-25 (×4): 200 mg via ORAL
  Filled 2014-06-23 (×5): qty 1

## 2014-06-23 MED ORDER — PROPRANOLOL HCL ER 60 MG PO CP24
60.0000 mg | ORAL_CAPSULE | Freq: Every morning | ORAL | Status: DC
  Administered 2014-06-24 – 2014-06-25 (×2): 60 mg via ORAL
  Filled 2014-06-23 (×2): qty 1

## 2014-06-23 MED ORDER — CEFAZOLIN SODIUM 1-5 GM-% IV SOLN
1.0000 g | Freq: Four times a day (QID) | INTRAVENOUS | Status: AC
  Administered 2014-06-23 – 2014-06-24 (×2): 1 g via INTRAVENOUS
  Filled 2014-06-23 (×2): qty 50

## 2014-06-23 MED ORDER — LISINOPRIL 10 MG PO TABS
10.0000 mg | ORAL_TABLET | Freq: Every morning | ORAL | Status: DC
  Administered 2014-06-24: 10 mg via ORAL
  Filled 2014-06-23 (×2): qty 1

## 2014-06-23 MED ORDER — TRANEXAMIC ACID 1000 MG/10ML IV SOLN
2000.0000 mg | INTRAVENOUS | Status: DC | PRN
  Administered 2014-06-23: 2000 mg via TOPICAL

## 2014-06-23 MED ORDER — HYDROCODONE-ACETAMINOPHEN 5-325 MG PO TABS
1.0000 | ORAL_TABLET | ORAL | Status: DC | PRN
  Administered 2014-06-23: 1 via ORAL
  Administered 2014-06-24: 2 via ORAL
  Filled 2014-06-23 (×2): qty 2
  Filled 2014-06-23: qty 1

## 2014-06-23 MED ORDER — HYDROMORPHONE HCL 1 MG/ML IJ SOLN
INTRAMUSCULAR | Status: DC | PRN
  Administered 2014-06-23 (×2): 1 mg via INTRAVENOUS

## 2014-06-23 MED ORDER — FLEET ENEMA 7-19 GM/118ML RE ENEM: 1.0000 | ENEMA | Freq: Once | RECTAL | Status: AC | PRN

## 2014-06-23 MED ORDER — SODIUM CHLORIDE 0.9 % IR SOLN
Status: DC | PRN
  Administered 2014-06-23: 500 mL

## 2014-06-23 MED ORDER — ONDANSETRON HCL 4 MG/2ML IJ SOLN
INTRAMUSCULAR | Status: DC | PRN
  Administered 2014-06-23: 4 mg via INTRAVENOUS

## 2014-06-23 MED ORDER — FENTANYL CITRATE (PF) 250 MCG/5ML IJ SOLN
INTRAMUSCULAR | Status: AC
Start: 2014-06-23 — End: 2014-06-23
  Filled 2014-06-23: qty 5

## 2014-06-23 SURGICAL SUPPLY — 75 items
ANCHOR SUPER QUICK (Anchor) ×3 IMPLANT
BAG DECANTER FOR FLEXI CONT (MISCELLANEOUS) ×3 IMPLANT
BAG ZIPLOCK 12X15 (MISCELLANEOUS) ×3 IMPLANT
BANDAGE ELASTIC 4 VELCRO ST LF (GAUZE/BANDAGES/DRESSINGS) IMPLANT
BANDAGE ELASTIC 6 VELCRO ST LF (GAUZE/BANDAGES/DRESSINGS) ×3 IMPLANT
BANDAGE ESMARK 6X9 LF (GAUZE/BANDAGES/DRESSINGS) ×1 IMPLANT
BLADE SAG 18X100X1.27 (BLADE) ×3 IMPLANT
BLADE SAW SGTL 11.0X1.19X90.0M (BLADE) ×3 IMPLANT
BNDG ESMARK 6X9 LF (GAUZE/BANDAGES/DRESSINGS) ×3
BONE CEMENT GENTAMICIN (Cement) ×6 IMPLANT
CAP KNEE TOTAL 3 SIGMA ×3 IMPLANT
CEMENT BONE GENTAMICIN 40 (Cement) ×2 IMPLANT
CUFF TOURN SGL QUICK 34 (TOURNIQUET CUFF) ×2
CUFF TRNQT CYL 34X4X40X1 (TOURNIQUET CUFF) ×1 IMPLANT
DRAPE EXTREMITY T 121X128X90 (DRAPE) ×3 IMPLANT
DRAPE INCISE IOBAN 66X45 STRL (DRAPES) ×3 IMPLANT
DRAPE POUCH INSTRU U-SHP 10X18 (DRAPES) ×3 IMPLANT
DRAPE U-SHAPE 47X51 STRL (DRAPES) ×3 IMPLANT
DRSG AQUACEL AG ADV 3.5X10 (GAUZE/BANDAGES/DRESSINGS) ×3 IMPLANT
DRSG PAD ABDOMINAL 8X10 ST (GAUZE/BANDAGES/DRESSINGS) IMPLANT
DRSG TEGADERM 4X4.75 (GAUZE/BANDAGES/DRESSINGS) ×3 IMPLANT
DURAPREP 26ML APPLICATOR (WOUND CARE) ×3 IMPLANT
ELECT REM PT RETURN 9FT ADLT (ELECTROSURGICAL) ×3
ELECTRODE REM PT RTRN 9FT ADLT (ELECTROSURGICAL) ×1 IMPLANT
EVACUATOR 1/8 PVC DRAIN (DRAIN) ×3 IMPLANT
FACESHIELD WRAPAROUND (MASK) ×15 IMPLANT
GAUZE SPONGE 2X2 8PLY STRL LF (GAUZE/BANDAGES/DRESSINGS) ×1 IMPLANT
GLOVE BIOGEL PI IND STRL 6.5 (GLOVE) ×1 IMPLANT
GLOVE BIOGEL PI IND STRL 8 (GLOVE) ×1 IMPLANT
GLOVE BIOGEL PI INDICATOR 6.5 (GLOVE) ×2
GLOVE BIOGEL PI INDICATOR 8 (GLOVE) ×2
GLOVE ECLIPSE 8.0 STRL XLNG CF (GLOVE) ×6 IMPLANT
GLOVE SURG SS PI 6.5 STRL IVOR (GLOVE) ×3 IMPLANT
GOWN STRL REUS W/TWL LRG LVL3 (GOWN DISPOSABLE) ×3 IMPLANT
GOWN STRL REUS W/TWL XL LVL3 (GOWN DISPOSABLE) ×3 IMPLANT
HANDPIECE INTERPULSE COAX TIP (DISPOSABLE) ×2
IMMOBILIZER KNEE 20 (SOFTGOODS) ×3 IMPLANT
IMMOBILIZER KNEE 20 THIGH 36 (SOFTGOODS) IMPLANT
KIT BASIN OR (CUSTOM PROCEDURE TRAY) ×3 IMPLANT
LIQUID BAND (GAUZE/BANDAGES/DRESSINGS) ×3 IMPLANT
MANIFOLD NEPTUNE II (INSTRUMENTS) ×3 IMPLANT
NDL SAFETY ECLIPSE 18X1.5 (NEEDLE) ×2 IMPLANT
NEEDLE HYPO 18GX1.5 SHARP (NEEDLE) ×4
NEEDLE HYPO 22GX1.5 SAFETY (NEEDLE) ×6 IMPLANT
NS IRRIG 1000ML POUR BTL (IV SOLUTION) IMPLANT
PACK TOTAL JOINT (CUSTOM PROCEDURE TRAY) ×3 IMPLANT
PADDING CAST COTTON 6X4 STRL (CAST SUPPLIES) IMPLANT
PEN SKIN MARKING BROAD (MISCELLANEOUS) ×3 IMPLANT
POSITIONER SURGICAL ARM (MISCELLANEOUS) ×3 IMPLANT
SET HNDPC FAN SPRY TIP SCT (DISPOSABLE) ×1 IMPLANT
SET PAD KNEE POSITIONER (MISCELLANEOUS) ×3 IMPLANT
SPONGE GAUZE 2X2 STER 10/PKG (GAUZE/BANDAGES/DRESSINGS) ×2
SPONGE LAP 18X18 X RAY DECT (DISPOSABLE) IMPLANT
SPONGE SURGIFOAM ABS GEL 100 (HEMOSTASIS) ×3 IMPLANT
STAPLER VISISTAT 35W (STAPLE) IMPLANT
SUCTION FRAZIER 12FR DISP (SUCTIONS) ×3 IMPLANT
SUT BONE WAX W31G (SUTURE) ×3 IMPLANT
SUT MNCRL AB 4-0 PS2 18 (SUTURE) ×3 IMPLANT
SUT VIC AB 1 CT1 27 (SUTURE) ×4
SUT VIC AB 1 CT1 27XBRD ANTBC (SUTURE) ×2 IMPLANT
SUT VIC AB 2-0 CT1 27 (SUTURE) ×6
SUT VIC AB 2-0 CT1 TAPERPNT 27 (SUTURE) ×3 IMPLANT
SUT VLOC 180 0 24IN GS25 (SUTURE) ×3 IMPLANT
SYR 20CC LL (SYRINGE) ×6 IMPLANT
SYR 50ML LL SCALE MARK (SYRINGE) ×3 IMPLANT
TOWEL OR 17X26 10 PK STRL BLUE (TOWEL DISPOSABLE) ×3 IMPLANT
TOWEL OR NON WOVEN STRL DISP B (DISPOSABLE) ×3 IMPLANT
TOWER CARTRIDGE SMART MIX (DISPOSABLE) ×3 IMPLANT
TRAY FOLEY CATH 16FRSI W/METER (SET/KITS/TRAYS/PACK) ×3 IMPLANT
TRAY FOLEY W/METER SILVER 14FR (SET/KITS/TRAYS/PACK) IMPLANT
TRAY TIB SZ 5 REVISION (Knees) ×3 IMPLANT
WATER STERILE IRR 1500ML POUR (IV SOLUTION) ×3 IMPLANT
WEDGE SIZE 4 5MM (Knees) ×3 IMPLANT
WRAP KNEE MAXI GEL POST OP (GAUZE/BANDAGES/DRESSINGS) ×3 IMPLANT
YANKAUER SUCT BULB TIP 10FT TU (MISCELLANEOUS) ×3 IMPLANT

## 2014-06-23 NOTE — Interval H&P Note (Signed)
History and Physical Interval Note:  06/23/2014 12:43 PM  [name] [name]  has presented today for surgery, with the diagnosis of right knee osteoarthritis  The various methods of treatment have been discussed with the patient and family. After consideration of risks, benefits and other options for treatment, the patient has consented to  Procedure(s): RIGHT TOTAL KNEE ARTHROPLASTY (Right) as a surgical intervention .  The patient's history has been reviewed, patient examined, no change in status, stable for surgery.  I have reviewed the patient's chart and labs.  Questions were answered to the patient's satisfaction.     Grady Lucci A

## 2014-06-23 NOTE — Anesthesia Postprocedure Evaluation (Signed)
  Anesthesia Post-op Note  Patient: [name] [name]  Procedure(s) Performed: Procedure(s): RIGHT TOTAL KNEE ARTHROPLASTY (Right)  Patient Location: PACU  Anesthesia Type:General  Level of Consciousness: awake and alert   Airway and Oxygen Therapy: Patient Spontanous Breathing and Patient connected to nasal cannula oxygen  Post-op Pain: mild  Post-op Assessment: Post-op Vital signs reviewed, Patient's Cardiovascular Status Stable, Respiratory Function Stable and Patent Airway  Post-op Vital Signs: Reviewed and stable  Last Vitals:  Filed Vitals:   06/23/14 1649  BP:   Pulse: 84  Temp:   Resp: 19    Complications: No apparent anesthesia complications

## 2014-06-23 NOTE — Brief Op Note (Signed)
06/23/2014  3:45 PM  PATIENT:  [name] [name]  74 y.o. male  PRE-OPERATIVE DIAGNOSIS:  right knee Primary  Osteoarthritis,EXTREMELY COMPLEX with sever Flexion Contracture  POST-OPERATIVE DIAGNOSIS:  Same as Pre-Op  PROCEDURE:  Procedure(s): RIGHT TOTAL KNEE ARTHROPLASTY (Right) utilizing a Medial Wedge.Very Complex Case.  SURGEON:  Surgeon(s) and Role:    * Ranee Gosselinonald Terrianne Cavness, MD - Primary  PHYSICIAN ASSISTANT: Dimitri PedAmber Constable PA  ASSISTANTS: Dimitri PedAmber Constable PA  ANESTHESIA:   general  EBL:  Total I/O In: 1000 [I.V.:1000] Out: 300 [Urine:300]  BLOOD ADMINISTERED:none  DRAINS: (One) Hemovact drain(s) in the Right Knee with  Suction Open   LOCAL MEDICATIONS USED:  MARCAINE  20cc of of 0.25% Plain.Then 20cc of Exparel with 20cc of Normal Saline.   SPECIMEN:  No Specimen  DISPOSITION OF SPECIMEN:  N/A  COUNTS:  YES  TOURNIQUET:  * Missing tourniquet times found for documented tourniquets in log:  [id number] *  DICTATION: .Other Dictation: Dictation Number [id number]  PLAN OF CARE: Admit to inpatient   PATIENT DISPOSITION:  Stable in OR   Delay start of Pharmacological VTE agent (>24hrs) due to surgical blood loss or risk of bleeding: yes

## 2014-06-23 NOTE — Anesthesia Preprocedure Evaluation (Signed)
Anesthesia Evaluation  Patient identified by MRN, date of birth, ID band Patient awake    Reviewed: Allergy & Precautions, H&P , NPO status , Patient's Chart, lab work & pertinent test results  History of Anesthesia Complications Negative for: history of anesthetic complications  Airway Mallampati: II  TM Distance: >3 FB Neck ROM: Full    Dental no notable dental hx. (+) Edentulous Upper, Edentulous Lower, Dental Advisory Given   Pulmonary former smoker,  breath sounds clear to auscultation  Pulmonary exam normal       Cardiovascular Exercise Tolerance: Good hypertension, Pt. on medications and Pt. on home beta blockers + CAD, + Past MI and + Cardiac Stents Rhythm:Regular Rate:Normal  NSTEMI s/p taxus DES in 2007 to diag branch of LAD, also has RCA narrowing that is medically managed, recently saw cardiologists and stable with no changes to regimen Took beta blocker this am Off ASA for 7 days   Neuro/Psych negative neurological ROS  negative psych ROS   GI/Hepatic Neg liver ROS, GERD-  Medicated and Controlled,  Endo/Other  negative endocrine ROS  Renal/GU negative Renal ROS  negative genitourinary   Musculoskeletal  (+) Arthritis -, Osteoarthritis,    Abdominal   Peds negative pediatric ROS (+)  Hematology negative hematology ROS (+)   Anesthesia Other Findings   Reproductive/Obstetrics negative OB ROS                             Anesthesia Physical Anesthesia Plan  ASA: III  Anesthesia Plan: General   Post-op Pain Management:    Induction: Intravenous  Airway Management Planned: Oral ETT  Additional Equipment:   Intra-op Plan:   Post-operative Plan: Extubation in OR  Informed Consent:   Plan Discussed with: Surgeon  Anesthesia Plan Comments:         Anesthesia Quick Evaluation

## 2014-06-23 NOTE — Plan of Care (Signed)
Problem: Consults Goal: Diagnosis- Total Joint Replacement Right total knee     

## 2014-06-23 NOTE — Progress Notes (Signed)
Utilization review completed.  

## 2014-06-23 NOTE — Transfer of Care (Signed)
Immediate Anesthesia Transfer of Care Note  Patient: [name] PlumberDavid [name]  Procedure(s) Performed: Procedure(s): RIGHT TOTAL KNEE ARTHROPLASTY (Right)  Patient Location: PACU  Anesthesia Type:General  Level of Consciousness:  sedated, patient cooperative and responds to stimulation  Airway & Oxygen Therapy:Patient Spontanous Breathing and Patient connected to face mask oxgen  Post-op Assessment:  Report given to PACU RN and Post -op Vital signs reviewed and stable  Post vital signs:  Reviewed and stable  Last Vitals:  Filed Vitals:   06/23/14 0947  BP: 145/70  Pulse: 59  Temp: 37.1 C  Resp: 18    Complications: No apparent anesthesia complications

## 2014-06-23 NOTE — Anesthesia Procedure Notes (Signed)
Procedure Name: LMA Insertion Performed by: Ozella AlmondINMAN, Mirian Casco F Pre-anesthesia Checklist: Patient identified, Emergency Drugs available, Suction available and Patient being monitored Patient Re-evaluated:Patient Re-evaluated prior to inductionOxygen Delivery Method: Circle system utilized Preoxygenation: Pre-oxygenation with 100% oxygen Intubation Type: IV induction Ventilation: Mask ventilation without difficulty LMA: LMA inserted LMA Size: 5.0 Number of attempts: 1 Placement Confirmation: positive ETCO2 and breath sounds checked- equal and bilateral Tube secured with: Tape Dental Injury: Teeth and Oropharynx as per pre-operative assessment  Comments: LMA placed without difficulty.

## 2014-06-24 ENCOUNTER — Encounter (HOSPITAL_COMMUNITY): Payer: Self-pay | Admitting: Orthopedic Surgery

## 2014-06-24 LAB — CBC
HCT: 41.1 % (ref 39.0–52.0)
HEMOGLOBIN: 13.1 g/dL (ref 13.0–17.0)
MCH: 30.2 pg (ref 26.0–34.0)
MCHC: 31.9 g/dL (ref 30.0–36.0)
MCV: 94.7 fL (ref 78.0–100.0)
Platelets: 175 10*3/uL (ref 150–400)
RBC: 4.34 MIL/uL (ref 4.22–5.81)
RDW: 13.9 % (ref 11.5–15.5)
WBC: 10.9 10*3/uL — ABNORMAL HIGH (ref 4.0–10.5)

## 2014-06-24 LAB — BASIC METABOLIC PANEL
ANION GAP: 6 (ref 5–15)
BUN: 13 mg/dL (ref 6–23)
CHLORIDE: 102 mmol/L (ref 96–112)
CO2: 30 mmol/L (ref 19–32)
Calcium: 8.5 mg/dL (ref 8.4–10.5)
Creatinine, Ser: 1.02 mg/dL (ref 0.50–1.35)
GFR, EST AFRICAN AMERICAN: 83 mL/min — AB (ref >90)
GFR, EST NON AFRICAN AMERICAN: 72 mL/min — AB (ref >90)
Glucose, Bld: 154 mg/dL — ABNORMAL HIGH (ref 70–99)
Potassium: 4.8 mmol/L (ref 3.5–5.1)
SODIUM: 138 mmol/L (ref 135–145)

## 2014-06-24 MED ORDER — CHLORDIAZEPOXIDE HCL 25 MG PO CAPS
25.0000 mg | ORAL_CAPSULE | Freq: Every morning | ORAL | Status: DC
  Administered 2014-06-24 – 2014-06-25 (×2): 25 mg via ORAL
  Filled 2014-06-24 (×2): qty 1

## 2014-06-24 MED ORDER — OXYCODONE HCL 5 MG PO TABS
5.0000 mg | ORAL_TABLET | ORAL | Status: DC | PRN
  Administered 2014-06-24: 10 mg via ORAL
  Administered 2014-06-24: 5 mg via ORAL
  Administered 2014-06-24 – 2014-06-25 (×4): 10 mg via ORAL
  Filled 2014-06-24: qty 1
  Filled 2014-06-24 (×5): qty 2

## 2014-06-24 NOTE — Evaluation (Signed)
Physical Therapy Evaluation Patient Details Name: [name] [name] MRN: [id number] DOB: 12/22/1940 Today's Date: 06/24/2014   History of Present Illness  R TKR: hx including L TKR, MI, back surgery and CAD  Clinical Impression  Pt s/p R TKR presents with decreased R LE strength/ROM and post op pain limiting functional mobility.  Pt would benefit from short term SNF level rehab to maximize IND and safety prior to return home ALONE.    Follow Up Recommendations SNF    Equipment Recommendations  None recommended by PT    Recommendations for Other Services OT consult     Precautions / Restrictions Precautions Precautions: Knee;Fall Required Braces or Orthoses: Knee Immobilizer - Right Knee Immobilizer - Right: Discontinue once straight leg raise with < 10 degree lag Restrictions Weight Bearing Restrictions: No Other Position/Activity Restrictions: WBAT      Mobility  Bed Mobility Overal bed mobility: Needs Assistance Bed Mobility: Supine to Sit;Sit to Supine     Supine to sit: Min assist Sit to supine: Min assist   General bed mobility comments: cues for sequence and use of L LE to self assist.  Physical assist to manage R LE  Transfers Overall transfer level: Needs assistance Equipment used: Rolling walker (2 wheeled) Transfers: Sit to/from Stand Sit to Stand: Min assist         General transfer comment: cues for LE management and use of UEs to self assist  Ambulation/Gait Ambulation/Gait assistance: Min assist Ambulation Distance (Feet): 22 Feet Assistive device: Rolling walker (2 wheeled) Gait Pattern/deviations: Step-to pattern;Decreased step length - right;Decreased step length - left;Shuffle;Trunk flexed Gait velocity: decr   General Gait Details: Cues for sequence, posture and position from [employer]            Wheelchair Mobility    Modified Rankin (Stroke Patients Only)       Balance                                              Pertinent Vitals/Pain Pain Assessment: 0-10 Pain Score: 9  Pain Location: R knee and thigh Pain Descriptors / Indicators: Aching;Sore Pain Intervention(s): Limited activity within patient's tolerance;Monitored during session;Premedicated before session;Ice applied    Home Living Family/patient expects to be discharged to:: Skilled nursing facility Living Arrangements: Alone                    Prior Function Level of Independence: Independent               Hand Dominance        Extremity/Trunk Assessment   Upper Extremity Assessment: Overall WFL for tasks assessed           Lower Extremity Assessment: RLE deficits/detail;LLE deficits/detail   LLE Deficits / Details: Strength WFL with AROM to <90 knee flex     Communication   Communication: No difficulties  Cognition Arousal/Alertness: Awake/alert Behavior During Therapy: WFL for tasks assessed/performed Overall Cognitive Status: Within Functional Limits for tasks assessed                      General Comments      Exercises Total Joint Exercises Ankle Circles/Pumps: AROM;Both;10 reps;Supine      Assessment/Plan    PT Assessment Patient needs continued PT services  PT Diagnosis Difficulty walking   PT Problem List Decreased strength;Decreased range of  motion;Decreased activity tolerance;Decreased mobility;Decreased knowledge of use of DME;Pain  PT Treatment Interventions DME instruction;Gait training;Stair training;Functional mobility training;Therapeutic activities;Therapeutic exercise;Patient/family education   PT Goals (Current goals can be found in the Care Plan section) Acute Rehab PT Goals Patient Stated Goal: [facility] and then home to resume previous lifestyle with decreased pain PT Goal Formulation: With patient Time For Goal Achievement: 07/01/14 Potential to Achieve Goals: Good    Frequency 7X/week   Barriers to discharge         Co-evaluation               End of Session Equipment Utilized During Treatment: Gait belt;Right knee immobilizer Activity Tolerance: Patient limited by pain Patient left: in bed;with call bell/phone within reach Nurse Communication: Mobility status         Time: [id number] PT Time Calculation (min) (ACUTE ONLY): 26 min   Charges:   PT Evaluation $Initial PT Evaluation Tier I: 1 Procedure PT Treatments $Gait Training: 8-22 mins   PT G Codes:        [name] [name] 06/24/2014, 11:57 AM

## 2014-06-24 NOTE — Discharge Instructions (Addendum)
Marland Kitchen.INSTRUCTIONS AFTER JOINT REPLACEMENT   Remove items at home which could result in a fall. This includes throw rugs or furniture in walking pathways ICE to the affected joint every three hours while awake for 30 minutes at a time, for at least the first 3-5 days, and then as needed for pain and swelling.  Continue to use ice for pain and swelling. You may notice swelling that will progress down to the foot and ankle.  This is normal after surgery.  Elevate your leg when you are not up walking on it.   Continue to use the breathing machine you got in the hospital (incentive spirometer) which will help keep your temperature down.  It is common for your temperature to cycle up and down following surgery, especially at night when you are not up moving around and exerting yourself.  The breathing machine keeps your lungs expanded and your temperature down.   DIET:  As you were doing prior to hospitalization, we recommend a well-balanced diet.  DRESSING / WOUND CARE / SHOWERING  Keep the surgical dressing until follow up.  The dressing is water proof, so you can shower without any extra covering.  IF THE DRESSING FALLS OFF or the wound gets wet inside, change the dressing with sterile gauze.  Please use good hand washing techniques before changing the dressing.  Do not use any lotions or creams on the incision until instructed by your surgeon.    ACTIVITY  Increase activity slowly as tolerated, but follow the weight bearing instructions below.   No driving for 6 weeks or until further direction given by your physician.  You cannot drive while taking narcotics.  No lifting or carrying greater than 10 lbs. until further directed by your surgeon. Avoid periods of inactivity such as sitting longer than an hour when not asleep. This helps prevent blood clots.  You may return to work once you are authorized by your doctor.     WEIGHT BEARING   Weight bearing as tolerated with assist device (walker,  cane, etc) as directed, use it as long as suggested by your surgeon or therapist, typically at least 4-6 weeks.   EXERCISES  Results after joint replacement surgery are often greatly improved when you follow the exercise, range of motion and muscle strengthening exercises prescribed by your doctor. Safety measures are also important to protect the joint from further injury. Any time any of these exercises cause you to have increased pain or swelling, decrease what you are doing until you are comfortable again and then slowly increase them. If you have problems or questions, call your caregiver or physical therapist for advice.   Rehabilitation is important following a joint replacement. After just a few days of immobilization, the muscles of the leg can become weakened and shrink (atrophy).  These exercises are designed to build up the tone and strength of the thigh and leg muscles and to improve motion. Often times heat used for twenty to thirty minutes before working out will loosen up your tissues and help with improving the range of motion but do not use heat for the first two weeks following surgery (sometimes heat can increase post-operative swelling).   These exercises can be done on a training (exercise) mat, on the floor, on a table or on a bed. Use whatever works the best and is most comfortable for you.    Use music or television while you are exercising so that the exercises are a pleasant break in your  day. This will make your life better with the exercises acting as a break in your routine that you can look forward to.   Perform all exercises about fifteen times, three times per day or as directed.  You should exercise both the operative leg and the other leg as well.   Exercises include:   Quad Sets - Tighten up the muscle on the front of the thigh (Quad) and hold for 5-10 seconds.   Straight Leg Raises - With your knee straight (if you were given a brace, keep it on), lift the leg to 60  degrees, hold for 3 seconds, and slowly lower the leg.  Perform this exercise against resistance later as your leg gets stronger.  Leg Slides: Lying on your back, slowly slide your foot toward your buttocks, bending your knee up off the floor (only go as far as is comfortable). Then slowly slide your foot back down until your leg is flat on the floor again.  Angel Wings: Lying on your back spread your legs to the side as far apart as you can without causing discomfort.  Hamstring Strength:  Lying on your back, push your heel against the floor with your leg straight by tightening up the muscles of your buttocks.  Repeat, but this time bend your knee to a comfortable angle, and push your heel against the floor.  You may put a pillow under the heel to make it more comfortable if necessary.   A rehabilitation program following joint replacement surgery can speed recovery and prevent re-injury in the future due to weakened muscles. Contact your doctor or a physical therapist for more information on knee rehabilitation.    CONSTIPATION  Constipation is defined medically as fewer than three stools per week and severe constipation as less than one stool per week.  Even if you have a regular bowel pattern at home, your normal regimen is likely to be disrupted due to multiple reasons following surgery.  Combination of anesthesia, postoperative narcotics, change in appetite and fluid intake all can affect your bowels.   YOU MUST use at least one of the following options; they are listed in order of increasing strength to get the job done.  They are all available over the counter, and you may need to use some, POSSIBLY even all of these options:    Drink plenty of fluids (prune juice may be helpful) and high fiber foods Colace 100 mg by mouth twice a day  Senokot for constipation as directed and as needed Dulcolax (bisacodyl), take with full glass of water  Miralax (polyethylene glycol) once or twice a day as  needed.  If you have tried all these things and are unable to have a bowel movement in the first 3-4 days after surgery call either your surgeon or your primary doctor.    If you experience loose stools or diarrhea, hold the medications until you stool forms back up.  If your symptoms do not get better within 1 week or if they get worse, check with your doctor.  If you experience "the worst abdominal pain ever" or develop nausea or vomiting, please contact the office immediately for further recommendations for treatment.   ITCHING:  If you experience itching with your medications, try taking only a single pain pill, or even half a pain pill at a time.  You can also use Benadryl over the counter for itching or also to help with sleep.   TED HOSE STOCKINGS:  Use stockings on  both legs until for at least 2 weeks or as directed by physician office. They may be removed at night for sleeping.  MEDICATIONS:  See your medication summary on the After Visit Summary that nursing will review with you.  You may have some home medications which will be placed on hold until you complete the course of blood thinner medication.  It is important for you to complete the blood thinner medication as prescribed.  PRECAUTIONS:  If you experience chest pain or shortness of breath - call 911 immediately for transfer to the hospital emergency department.   If you develop a fever greater that 101 F, purulent drainage from wound, increased redness or drainage from wound, foul odor from the wound/dressing, or calf pain - CONTACT YOUR SURGEON.                                                   FOLLOW-UP APPOINTMENTS:  If you do not already have a post-op appointment, please call the office for an appointment to be seen by your surgeon.  Guidelines for how soon to be seen are listed in your After Visit Summary, but are typically between 1-4 weeks after surgery.  Do not take vitamins and supplements while taking  Xarelto  MAKE SURE YOU:  Understand these instructions.  Get help right away if you are not doing well or get worse.    Thank you for letting us be a part of your medical care team.  It is a privilege we respect greatly.  We hope these instructions will help you stay on track for a fast and full recovery!   Information on my medicine - XARELTO (Rivaroxaban)  This medication education was reviewed with me or my healthcare representative as part of my discharge preparation.  The pharmacist that spoke with me during my hospital stay was:  Maurice March [facility]  Why was Xarelto prescribed for you? Xarelto was prescribed for you to reduce the risk of blood clots forming after orthopedic surgery. The medical term for these abnormal blood clots is venous thromboembolism (VTE).  What do you need to know about xarelto ? Take your Xarelto ONCE DAILY at the same time every day. You may take it either with or without food.  If you have difficulty swallowing the tablet whole, you may crush it and mix in applesauce just prior to taking your dose.  Take Xarelto exactly as prescribed by your doctor and DO NOT stop taking Xarelto without talking to the doctor who prescribed the medication.  Stopping without other VTE prevention medication to take the place of Xarelto may increase your risk of developing a clot.  After discharge, you should have regular check-up appointments with your healthcare provider that is prescribing your Xarelto.    What do you do if you miss a dose? If you miss a dose, take it as soon as you remember on the same day then continue your regularly scheduled once daily regimen the next day. Do not take two doses of Xarelto on the same day.   Important Safety Information A possible side effect of Xarelto is bleeding. You should call your healthcare provider right away if you experience any of the following: ? Bleeding from an injury or your nose that does not  stop. ? Unusual colored urine (red or dark brown) or unusual  colored stools (red or black). ? Unusual bruising for unknown reasons. ? A serious fall or if you hit your head (even if there is no bleeding).  Some medicines may interact with Xarelto and might increase your risk of bleeding while on Xarelto. To help avoid this, consult your healthcare provider or pharmacist prior to using any new prescription or non-prescription medications, including herbals, vitamins, non-steroidal anti-inflammatory drugs (NSAIDs) and supplements.  This website has more information on Xarelto: [email or web address]

## 2014-06-24 NOTE — Progress Notes (Signed)
Clinical Social Work Department BRIEF PSYCHOSOCIAL ASSESSMENT 06/24/2014  Patient:  [name] [name],[name] [name]     Account Number:  [id number]     Admit date:  06/23/2014  Clinical Social Worker:  Lacie Scotts  Date/Time:  06/24/2014 10:36 AM  Referred by:  Physician  Date Referred:  06/24/2014 Referred for  SNF Placement   Other Referral:   Interview type:  Patient Other interview type:    PSYCHOSOCIAL DATA Living Status:  ALONE Admitted from facility:   Level of care:   Primary support name:  Maryann Conners Primary support relationship to patient:  SIBLING Degree of support available:   supportive    CURRENT CONCERNS Current Concerns  Post-Acute Placement   Other Concerns:    SOCIAL WORK ASSESSMENT / PLAN Pt is a 74 yr old gentleman living at home prior to hospitalization. CSW met with pt to assist with d/c planning. This is a planned admission. Pt has made prior arrangements to have ST Rehab at [facility] following hospital d/c. CSW has contacted SNF and d/c plans have confirmed pending [employer] authorization. CSW will continue to follow to assist with d/c planning.   Assessment/plan status:  Psychosocial Support/Ongoing Assessment of Needs Other assessment/ plan:   Information/referral to community resources:   Insurance coverage for SNF and ambulance transport reviewed.    PATIENT'S/FAMILY'S RESPONSE TO PLAN OF CARE: Pt's mood is anxious this am. " I'm having trouble with pain control. " He is concerned about pain while working with therapy.  Pt is looking forward to returning to [facility] for rehab. " They have wonderful PT but their food isn't good. " CSW offered support and reassurance.    Werner Lean LCSW [phone]

## 2014-06-24 NOTE — Op Note (Signed)
NAMKarle Plumber:  Truman, Tibor                ACCOUNT NO.:  [id number]  MEDICAL RECORD NO.:  [id number]  LOCATION:  1610                         [facility]  PHYSICIAN:  Georges Lynchonald A. Kiamesha Samet, M.D.DATE OF BIRTH:  [birth year]  DATE OF PROCEDURE:  06/23/2014 DATE OF DISCHARGE:                              OPERATIVE REPORT   PREOPERATIVE DIAGNOSIS:  Severe bone-on-bone very complex varus knee, primary osteoarthritis with severe flexion contractures.  Note, he could not flex his knee more than about 60 degrees.  He lacked about 10 degrees of extension, his knee was extremely stiff and deformed.  POSTOPERATIVE DIAGNOSIS:  Severe bone-on-bone very complex varus knee, primary osteoarthritis with severe flexion contractures.  Note, he could not flex his knee more than about 60 degrees.  He lacked about 10 degrees of extension, his knee was extremely stiff and deformed.  OPERATION:  Right total knee arthroplasty utilizing the DePuy system, all 3 components were cemented.  Gentamicin was used in the cement.  At this particular time on this side, we also used a medial wedge because of the severe varus deformity.  The sizes used was a size 4 right femoral component posterior cruciate sacrificing, the revision tibial tray was a size 5, the medial wedge was a size 4, 5 mm thickness.  The insert was a 10 mm thickness rotating platform size 4, the patella was a size 41 with 3 pegs.  Gentamicin was used in the cement.  We inserted 1 anchor in the tibia anteriorly to reinforce the patellar tendon.  SURGEON:  Georges Lynchonald A. Darrelyn HillockGioffre, M.D.  ASSISTANT:  Dimitri PedAmber Constable, [place]  DESCRIPTION OF PROCEDURE:  Under general anesthesia, routine orthopedic prep and draping of the right lower extremity was carried out.  The appropriate time-out was first carried out.  I also marked the appropriate right leg in the holding area.  Note, he had a severe varus contracted right knee.  At this time, the knee was flexed the best  we could and placed in the [facility] knee holder.  An anterior approach to the knee was carried out.  At this time, after 2 flaps were created, I carried out a median parapatellar incision.  I then reflected the patella laterally, flexed the knee, and did medial and lateral meniscectomies, then sized the anterior and posterior cruciate ligaments.  Note his knee was extremely deformed, we removed multiple osteophytes from the tibial plateau, the patella, and the femur.  We then went on, made our initial drill hole in the intercondylar notch. We then inserted our canal finder.  After that, we thoroughly irrigated out the canal.  We then removed 30 mm thickness off the distal femur because of the severe deformity.  Following that, we then measured our femur to be a size 4.  We did our anterior-posterior chamfering cuts for a size 4 right femoral component.  Then, attention was directed to the tibial plateau.  We had large osteophytes we had removed posteriorly and medially.  We had to do a good medial release because of the severe varus deformity.  At this particular time, we then went on and prepared our tibia.  We measured the tibial plateau to be a  size 5.  We made our initial drill hole in the tibial plateau.  The canal finder then was inserted.  We then thoroughly irrigated out the knee after we removed the canal finder.  We then went on and removed approximately 6 mm thickness, then went on to remove 2 more mm thickness off the tibial plateau.  We still had a rather significant defect medially even after removing the medial spurs.  We then elected to make a wedge cut for 5 mm wedge on the medial aspect of the tibia.  At this particular time, prior to doing the wedge, we inserted our gap finders and made sure we had a good gap clearance.  Prior to doing that, we removed our posterior spurs from the femur.  Then, we went on and continued to prepare the tibia in the usual fashion.  We cut  our wedge cut as I mentioned above for a 5 mm wedge.  We did make our initial drill hole distally for the revision stem prosthesis.  After doing that, we then cut our keel cut, we inserted our wedge, we utilized a 4 mm wedge under surface of the 5 tray, which was about 5 mm thick.  We had a good fit at this time.  We then inserted our trial components after we did our notch cut out of the distal femur.  We then reduced the knee with a 10 mm thickness insert. I then did a resurfacing procedure on the patella.  The patella was extremely deformed.  After doing our resurfacing procedure, we measured the patella to be a size 41.  Three drill holes were made in the patella at this time.  Following that, we then removed all trial components.  We still had a slight flexion contracture.  We did not want to try to overcorrect this because obstructing the major vessels and nerves posteriorly.  At this particular time, we then went on and water picked out the knee after we removed the trial components.  We then dried the knee out, cemented all 3 components in simultaneously.  We had a very nice fit at this time, forward extension and flexion.  We then removed loose pieces of cement, water picked out the knee again to make sure all loose pieces were removed.  At this time, we then inserted our permanent rotating platform, 10 mm thickness, size 4.  I then reduced the knee and had good stability.  We then inserted our Hemovac drain.  I inserted an anchor to anchor down the patellar tendon.  After reinforcing the tendon, we then closed the knee in layers in usual fashion.  Prior to doing that, I injected 20 mL of 0.25% plain Marcaine in the soft tissue. We later injected tranexamic acid after the capsule was closed and a mixture of 20 mL of Exparel and 20 mL of normal saline.  We had a nice closure on the wound at this time.  I did a partial lateral release as well at this time.  The patient did have 2  g of IV Ancef as well.          ______________________________ Georges Lynch. Darrelyn Hillock, M.D.     RAG/MEDQ  D:  06/23/2014  T:  06/24/2014  Job:  [id number]

## 2014-06-24 NOTE — Plan of Care (Signed)
Problem: Consults Goal: Diagnosis- Total Joint Replacement Outcome: Completed/Met Date Met:  06/24/14 Primary Total Knee RIGHT

## 2014-06-24 NOTE — Progress Notes (Signed)
Physical Therapy Treatment Patient Details Name: [name] PlumberDavid Derick MRN: [id number] DOB: 08-30-1940 Today's Date: 06/24/2014    History of Present Illness R TKR: hx including L TKR, MI, back surgery and CAD    PT Comments    Pt cooperative but ltd this date by pain and fatigue.  Agreeable to therex but requests to not ambulate again today  Follow Up Recommendations  SNF     Equipment Recommendations  None recommended by PT    Recommendations for Other Services OT consult     Precautions / Restrictions Precautions Precautions: Knee;Fall Required Braces or Orthoses: Knee Immobilizer - Right Knee Immobilizer - Right: Discontinue once straight leg raise with < 10 degree lag Restrictions Weight Bearing Restrictions: No Other Position/Activity Restrictions: WBAT    Mobility  Bed Mobility Overal bed mobility: Needs Assistance Bed Mobility: Supine to Sit;Sit to Supine     Supine to sit: Min assist Sit to supine: Min assist   General bed mobility comments: OOB deferred at pt request 2* pain  Transfers Overall transfer level: Needs assistance Equipment used: Rolling walker (2 wheeled) Transfers: Sit to/from Stand Sit to Stand: Min assist         General transfer comment: cues for LE management and use of UEs to self assist  Ambulation/Gait Ambulation/Gait assistance: Min assist Ambulation Distance (Feet): 22 Feet Assistive device: Rolling walker (2 wheeled) Gait Pattern/deviations: Step-to pattern;Decreased step length - right;Decreased step length - left;Shuffle;Trunk flexed Gait velocity: decr   General Gait Details: Cues for sequence, posture and position from [employer]   Stairs            Wheelchair Mobility    Modified Rankin (Stroke Patients Only)       Balance                                    Cognition Arousal/Alertness: Awake/alert Behavior During Therapy: WFL for tasks assessed/performed Overall Cognitive Status: Within Functional  Limits for tasks assessed                      Exercises Total Joint Exercises Ankle Circles/Pumps: AROM;Both;Supine;15 reps Quad Sets: AROM;Both;10 reps;Supine Heel Slides: AAROM;15 reps;Right;Supine Straight Leg Raises: Right;AAROM;10 reps;Supine    General Comments        Pertinent Vitals/Pain Pain Assessment: 0-10 Pain Score: 8  Pain Location: R knee Pain Descriptors / Indicators: Aching;Sore Pain Intervention(s): Limited activity within patient's tolerance;Monitored during session;Premedicated before session;Ice applied    Home Living Family/patient expects to be discharged to:: Skilled nursing facility Living Arrangements: Alone                  Prior Function Level of Independence: Independent          PT Goals (current goals can now be found in the care plan section) Acute Rehab PT Goals Patient Stated Goal: [facility] and then home to resume previous lifestyle with decreased pain PT Goal Formulation: With patient Time For Goal Achievement: 07/01/14 Potential to Achieve Goals: Good Progress towards PT goals: Progressing toward goals    Frequency  7X/week    PT Plan Current plan remains appropriate    Co-evaluation             End of Session Equipment Utilized During Treatment: Gait belt;Right knee immobilizer Activity Tolerance: Patient limited by pain Patient left: in bed;with call bell/phone within reach  Time: 7829-5621 PT Time Calculation (min) (ACUTE ONLY): 22 min  Charges:  $Gait Training: 8-22 mins $Therapeutic Exercise: 8-22 mins                    G Codes:      Skyla Champagne 2014-07-11, 2:31 PM

## 2014-06-24 NOTE — Progress Notes (Signed)
OT Cancellation Note  Patient Details Name: [name] [name] MRN: [id number] DOB: 11-24-1940   Cancelled Treatment:    Reason Eval/Treat Not Completed: Other (comment)   Pt is limited by pain this pm.  Will check back in the morning.  [name] [name] 06/24/2014, 1:35 PM  [name] [name], OTR/L [phone] 06/24/2014

## 2014-06-24 NOTE — Progress Notes (Signed)
Clinical Social Work Department CLINICAL SOCIAL WORK PLACEMENT NOTE 06/24/2014  Patient:  [name],[name]  Account Number:  [id number] Admit date:  06/23/2014  Clinical Social Worker:  Cori RazorJAMIE Gizzelle Lacomb, LCSW  Date/time:  06/24/2014 10:48 AM  Clinical Social Work is seeking post-discharge placement for this patient at the following level of care:   SKILLED NURSING   (*CSW will update this form in Epic as items are completed)     Patient/family provided with [facility] Department of Clinical Social Work's list of facilities offering this level of care within the geographic area requested by the patient (or if unable, by the patient's family).  06/24/2014  Patient/family informed of their freedom to choose among providers that offer the needed level of care, that participate in Medicare, Medicaid or managed care program needed by the patient, have an available bed and are willing to accept the patient.    Patient/family informed of [facility]' ownership interest in [facility], as well as of the fact that they are under no obligation to receive care at this facility.  PASARR submitted to EDS on 06/24/2014 PASARR number received on 06/24/2014  FL2 transmitted to all facilities in geographic area requested by pt/family on  06/24/2014 FL2 transmitted to all facilities within larger geographic area on   Patient informed that his/her managed care company has contracts with or will negotiate with  certain facilities, including the following:     Patient/family informed of bed offers received:  06/24/2014 [facility] Physician recommends and patient chooses bed at    Patient to be transferred to  on   Patient to be transferred to facility by  Patient and family notified of transfer on  Name of family member notified:    The following physician request were entered in Epic:   Additional Comments:  Cori RazorJamie Wandra Babin LCSW [phone]

## 2014-06-24 NOTE — Progress Notes (Signed)
   Subjective: 1 Day Post-Op Procedure(s) (LRB): RIGHT TOTAL KNEE ARTHROPLASTY (Right) Patient reports pain as moderate.   Patient seen in rounds for Dr. Darrelyn HillockGioffre. Patient is having some issues with pain management. No SOB or chest pain. He reports that he had some improvement in the pain when ice was applied to the knee. No issues overnight.     Objective: Vital signs in last 24 hours: Temp:  [97.9 F (36.6 C)-98.7 F (37.1 C)] 98.5 F (36.9 C) (04/20 0500) Pulse Rate:  [59-89] 89 (04/20 0500) Resp:  [7-19] 18 (04/20 0500) BP: (113-147)/(55-74) 122/60 mmHg (04/20 0500) SpO2:  [91 %-100 %] 94 % (04/20 0500) Weight:  [103.42 kg (228 lb)] 103.42 kg (228 lb) (04/19 1740)  Intake/Output from previous day:  Intake/Output Summary (Last 24 hours) at 06/24/14 0822 Last data filed at 06/24/14 0612  Gross per 24 hour  Intake   3705 ml  Output    975 ml  Net   2730 ml     Labs:  Recent Labs  06/24/14 0525  HGB 13.1    Recent Labs  06/24/14 0525  WBC 10.9*  RBC 4.34  HCT 41.1  PLT 175    Recent Labs  06/24/14 0525  NA 138  K 4.8  CL 102  CO2 30  BUN 13  CREATININE 1.02  GLUCOSE 154*  CALCIUM 8.5     EXAM General - Patient is Alert and Oriented Extremity - Neurologically intact Intact pulses distally Dorsiflexion/Plantar flexion intact No cellulitis present Compartment soft Dressing - dressing C/D/I Motor Function - intact, moving foot and toes well on exam.  Hemovac pulled without difficulty.  Past Medical History  Diagnosis Date  . Hyperlipidemia     with low HDL, improved on simvastatin.  Marland Kitchen. Hypertension   . Tremors of nervous system   . Myocardial infarction 2008  . GERD (gastroesophageal reflux disease)     rare, mild  . Glaucoma   . Arthritis     knees  . Coronary artery disease     status post non-ST-elevation myocardial   infarction in May 2008 with treatment of a diagonal branch lesion  with a Taxus drug-eluting stent.     Assessment/Plan: 1 Day Post-Op Procedure(s) (LRB): RIGHT TOTAL KNEE ARTHROPLASTY (Right) Active Problems:   History of total knee arthroplasty  Estimated body mass index is 33.65 kg/(m^2) as calculated from the following:   Height as of this encounter: 5\' 9"  (1.753 m).   Weight as of this encounter: 103.42 kg (228 lb). Advance diet Up with therapy D/C IV fluids when tolerating POs well  DVT Prophylaxis - Xarelto Weight-Bearing as tolerated  D/C O2 and Pulse OX and try on Room Air   Doing fair. Will adjust pain medications. PT today. [facility].   Dimitri PedAmber Coral Timme, PA-C Orthopaedic Surgery 06/24/2014, 8:22 AM

## 2014-06-25 LAB — BASIC METABOLIC PANEL
ANION GAP: 9 (ref 5–15)
BUN: 19 mg/dL (ref 6–23)
CO2: 27 mmol/L (ref 19–32)
Calcium: 8.2 mg/dL — ABNORMAL LOW (ref 8.4–10.5)
Chloride: 97 mmol/L (ref 96–112)
Creatinine, Ser: 1.49 mg/dL — ABNORMAL HIGH (ref 0.50–1.35)
GFR calc Af Amer: 53 mL/min — ABNORMAL LOW (ref >90)
GFR calc non Af Amer: 45 mL/min — ABNORMAL LOW (ref >90)
GLUCOSE: 146 mg/dL — AB (ref 70–99)
POTASSIUM: 4.3 mmol/L (ref 3.5–5.1)
SODIUM: 133 mmol/L — AB (ref 135–145)

## 2014-06-25 LAB — CBC
HCT: 37.1 % — ABNORMAL LOW (ref 39.0–52.0)
Hemoglobin: 11.8 g/dL — ABNORMAL LOW (ref 13.0–17.0)
MCH: 29.9 pg (ref 26.0–34.0)
MCHC: 31.8 g/dL (ref 30.0–36.0)
MCV: 93.9 fL (ref 78.0–100.0)
Platelets: 179 10*3/uL (ref 150–400)
RBC: 3.95 MIL/uL — AB (ref 4.22–5.81)
RDW: 13.7 % (ref 11.5–15.5)
WBC: 14.7 10*3/uL — AB (ref 4.0–10.5)

## 2014-06-25 MED ORDER — DOCUSATE SODIUM 100 MG PO CAPS
100.0000 mg | ORAL_CAPSULE | Freq: Two times a day (BID) | ORAL | Status: DC
  Administered 2014-06-25: 100 mg via ORAL

## 2014-06-25 MED ORDER — DOCUSATE SODIUM 100 MG PO CAPS
100.0000 mg | ORAL_CAPSULE | Freq: Two times a day (BID) | ORAL | Status: DC
Start: 2014-06-25 — End: 2015-12-16

## 2014-06-25 MED ORDER — OXYCODONE HCL 5 MG PO TABS: 5.0000 mg | ORAL_TABLET | ORAL | Status: DC | PRN

## 2014-06-25 MED ORDER — METHOCARBAMOL 500 MG PO TABS: 500.0000 mg | ORAL_TABLET | Freq: Four times a day (QID) | ORAL | Status: DC | PRN

## 2014-06-25 MED ORDER — RIVAROXABAN 10 MG PO TABS: 10.0000 mg | ORAL_TABLET | Freq: Every day | ORAL | Status: DC

## 2014-06-25 NOTE — Progress Notes (Signed)
Subjective: 2 Days Post-Op Procedure(s) (LRB): RIGHT TOTAL KNEE ARTHROPLASTY (Right) Patient reports pain as 2 on 0-10 scale.    Objective: Vital signs in last 24 hours: Temp:  [98.3 F (36.8 C)-98.9 F (37.2 C)] 98.9 F (37.2 C) (04/21 0501) Pulse Rate:  [86-104] 98 (04/21 0501) Resp:  [16-18] 18 (04/21 0501) BP: (93-136)/(48-80) 93/48 mmHg (04/21 0501) SpO2:  [92 %-96 %] 92 % (04/21 0501)  Intake/Output from previous day: 04/20 0701 - 04/21 0700 In: 240 [P.O.:240] Out: 925 [Urine:925] Intake/Output this shift:     Recent Labs  06/24/14 0525 06/25/14 0419  HGB 13.1 11.8*    Recent Labs  06/24/14 0525 06/25/14 0419  WBC 10.9* 14.7*  RBC 4.34 3.95*  HCT 41.1 37.1*  PLT 175 179    Recent Labs  06/24/14 0525 06/25/14 0419  NA 138 133*  K 4.8 4.3  CL 102 97  CO2 30 27  BUN 13 19  CREATININE 1.02 1.49*  GLUCOSE 154* 146*  CALCIUM 8.5 8.2*   No results for input(s): LABPT, INR in the last 72 hours.  No cellulitis present  Assessment/Plan: 2 Days Post-Op Procedure(s) (LRB): RIGHT TOTAL KNEE ARTHROPLASTY (Right) Up with therapy.Will DC to SNF  [name] [name] A 06/25/2014, 7:26 AM

## 2014-06-25 NOTE — Progress Notes (Signed)
Clinical Social Work Department CLINICAL SOCIAL WORK PLACEMENT NOTE 06/25/2014  Patient:  [name] [name],[name] [name]  Account Number:  [id number] Admit date:  06/23/2014  Clinical Social Worker:  Cori RazorJAMIE Siarah Deleo, LCSW  Date/time:  06/24/2014 10:48 AM  Clinical Social Work is seeking post-discharge placement for this patient at the following level of care:   SKILLED NURSING   (*CSW will update this form in Epic as items are completed)     Patient/family provided with [facility] Department of Clinical Social Work's list of facilities offering this level of care within the geographic area requested by the patient (or if unable, by the patient's family).  06/24/2014  Patient/family informed of their freedom to choose among providers that offer the needed level of care, that participate in Medicare, Medicaid or managed care program needed by the patient, have an available bed and are willing to accept the patient.    Patient/family informed of [facility]' ownership interest in [facility], as well as of the fact that they are under no obligation to receive care at this facility.  PASARR submitted to EDS on 06/24/2014 PASARR number received on 06/24/2014  FL2 transmitted to all facilities in geographic area requested by pt/family on  06/24/2014 FL2 transmitted to all facilities within larger geographic area on   Patient informed that his/her managed care company has contracts with or will negotiate with  certain facilities, including the following:     Patient/family informed of bed offers received:  06/24/2014 [facility] Physician recommends and patient chooses bed at    [facility] on  06/25/2014 Patient to be transferred to facility by CAR Patient and family notified of transfer on 06/25/2014 Name of family member notified:  SISTER  The following physician request were entered in Epic:   Additional Comments: Pt is in  agreement with d/c to SNF today. PT requested transport by car. [employer] provided prior authorization for SNF. NSG reviewed d/c summary, scripts, avs. Scripts included in d/c packet. D/c packet provided to pt prior to d/c.  Cori RazorJamie Cam Harnden LCSW [phone]

## 2014-06-25 NOTE — Progress Notes (Signed)
[facility] Sharrell Ku Emelda Kohlbeck RN

## 2014-06-25 NOTE — Progress Notes (Signed)
Physical Therapy Treatment Patient Details Name: [name] [name] Myre MRN: [id number] DOB: [birth year] Today's Date: 06/25/2014    History of Present Illness R TKR: hx including L TKR, MI, back surgery and CAD    PT Comments    Steady progress with mobility.  Pt dc to rehab facility likely this pm.  Follow Up Recommendations  SNF     Equipment Recommendations  None recommended by PT    Recommendations for Other Services OT consult     Precautions / Restrictions Precautions Precautions: Knee;Fall Required Braces or Orthoses: Knee Immobilizer - Right Knee Immobilizer - Right: Discontinue once straight leg raise with < 10 degree lag Restrictions Weight Bearing Restrictions: No Other Position/Activity Restrictions: WBAT    Mobility  Bed Mobility Overal bed mobility: Needs Assistance Bed Mobility: Sit to Supine;Supine to Sit     Supine to sit: Min assist Sit to supine: Min assist   General bed mobility comments: assist for RLE  Transfers Overall transfer level: Needs assistance Equipment used: Rolling walker (2 wheeled) Transfers: Sit to/from Stand Sit to Stand: Min assist         General transfer comment: cues for LE management and use of UEs to self assist  Ambulation/Gait Ambulation/Gait assistance: Min assist Ambulation Distance (Feet): 58 Feet Assistive device: Rolling walker (2 wheeled) Gait Pattern/deviations: Step-to pattern;Decreased step length - right;Decreased step length - left;Shuffle;Trunk flexed Gait velocity: decr   General Gait Details: Cues for sequence, posture and position from [employer]   Stairs            Wheelchair Mobility    Modified Rankin (Stroke Patients Only)       Balance                                    Cognition Arousal/Alertness: Awake/alert Behavior During Therapy: WFL for tasks assessed/performed Overall Cognitive Status: Within Functional Limits for tasks assessed                       Exercises Total Joint Exercises Ankle Circles/Pumps: AROM;Both;Supine;15 reps Quad Sets: AROM;Both;Supine;20 reps Heel Slides: AAROM;Right;Supine;20 reps Straight Leg Raises: Right;Supine;AAROM;AROM;20 reps Goniometric ROM: AAROM at R knee -12 - 47    General Comments        Pertinent Vitals/Pain Pain Assessment: 0-10 Pain Score: 6  Pain Location: R knee Pain Descriptors / Indicators: Aching;Sore Pain Intervention(s): Limited activity within patient's tolerance;Monitored during session;Premedicated before session;Ice applied    Home Living                      Prior Function            PT Goals (current goals can now be found in the care plan section) Acute Rehab PT Goals Patient Stated Goal: [facility] and then home to resume previous lifestyle with decreased pain PT Goal Formulation: With patient Time For Goal Achievement: 07/01/14 Potential to Achieve Goals: Good Progress towards PT goals: Progressing toward goals    Frequency  7X/week    PT Plan Current plan remains appropriate    Co-evaluation             End of Session Equipment Utilized During Treatment: Gait belt;Right knee immobilizer Activity Tolerance: Patient tolerated treatment well Patient left: in bed;with call bell/phone within reach     Time: 0932-1008 PT Time Calculation (min) (ACUTE ONLY): 36 min  Charges:  $Gait Training: 8-22 mins $Therapeutic Exercise: 8-22 mins                    G Codes:      Harry Bark 16-Jul-2014, 12:56 PM

## 2014-06-25 NOTE — Evaluation (Signed)
Occupational Therapy Evaluation Patient Details Name: [name] PlumberDavid [name] MRN: [id number] DOB: 1940-08-13 Today's Date: 06/25/2014    History of Present Illness R TKR: hx including L TKR, MI, back surgery and CAD   Clinical Impression   This 74 year old man was admitted for the above surgery. He will benefit from skilled OT.  Pt was independent prior to admission.  He currently needs overall min A for ADLs and goals are set for min guard in the acute setting.    Follow Up Recommendations  SNF    Equipment Recommendations  3 in 1 bedside comode    Recommendations for Other Services       Precautions / Restrictions Precautions Precautions: Knee;Fall Required Braces or Orthoses: Knee Immobilizer - Right Knee Immobilizer - Right: Discontinue once straight leg raise with < 10 degree lag Restrictions Weight Bearing Restrictions: No Other Position/Activity Restrictions: WBAT      Mobility Bed Mobility         Supine to sit: Min assist Sit to supine: Min assist   General bed mobility comments: assist for RLE  Transfers                 General transfer comment: did not stand:  min A with PT    Balance                                            ADL Overall ADL's : Needs assistance/impaired     Grooming: Oral care;Set up;Sitting   Upper Body Bathing: Set up;Sitting   Lower Body Bathing: Minimal assistance;Sitting/lateral leans   Upper Body Dressing : Set up;Sitting   Lower Body Dressing: Minimal assistance;Sitting/lateral leans                 General ADL Comments: performed ADL from EOB.  Pt did not want to stand--leaned laterally for ADL.  Pt is familiar with AE--he did not need it for other knee.  Would benefit from sock aide initially.     Vision     Perception     Praxis      Pertinent Vitals/Pain Pain Score: 5  Pain Location: R knee Pain Descriptors / Indicators: Aching Pain Intervention(s): Limited activity within  patient's tolerance     Hand Dominance     Extremity/Trunk Assessment Upper Extremity Assessment Upper Extremity Assessment: Overall WFL for tasks assessed           Communication Communication Communication: No difficulties   Cognition Arousal/Alertness: Awake/alert Behavior During Therapy: WFL for tasks assessed/performed Overall Cognitive Status: Within Functional Limits for tasks assessed                     General Comments       Exercises       Shoulder Instructions      Home Living Family/patient expects to be discharged to:: Skilled nursing facility Living Arrangements: Alone                                      Prior Functioning/Environment Level of Independence: Independent             OT Diagnosis: Generalized weakness   OT Problem List: Decreased strength;Decreased activity tolerance;Pain;Decreased knowledge of use of DME or AE   OT Treatment/Interventions: Self-care/ADL training;DME  and/or AE instruction;Patient/family education    OT Goals(Current goals can be found in the care plan section) Acute Rehab OT Goals Patient Stated Goal: [facility] re[facility] at [facility] and then home to resume previous lifestyle with decreased pain OT Goal Formulation: With patient Time For Goal Achievement: 07/02/14 Potential to Achieve Goals: Good ADL Goals Pt Will Perform Lower Body Dressing: with min guard assist;sit to/from stand;with adaptive equipment Pt Will Transfer to Toilet: with min guard assist;ambulating;bedside commode Pt Will Perform Toileting - Clothing Manipulation and hygiene: with min guard assist;sit to/from stand  OT Frequency: Min 2X/week   Barriers to D/C:            Co-evaluation              End of Session    Activity Tolerance: Patient tolerated treatment well Patient left: in bed;with call bell/phone within reach   Time: 0755-0817 OT Time Calculation (min): 22 min Charges:  OT General  Charges $OT Visit: 1 Procedure OT Evaluation $Initial OT Evaluation Tier I: 1 Procedure G-Codes:    [name] 24-Jul-2014, 9:00 AM Marica Otter, OTR/L [phone] 2014-07-24

## 2014-06-25 NOTE — Discharge Summary (Signed)
Physician Discharge Summary   Patient ID: [name] [name] MRN: [id number] DOB/AGE: Nov 23, 1940 74 y.o.  Admit date: 06/23/2014 Discharge date: 06/25/2014  Primary Diagnosis: Primary osteoarthritis, right knee   Admission Diagnoses:  Past Medical History  Diagnosis Date  . Hyperlipidemia     with low HDL, improved on simvastatin.  Marland Kitchen Hypertension   . Tremors of nervous system   . Myocardial infarction 2008  . GERD (gastroesophageal reflux disease)     rare, mild  . Glaucoma   . Arthritis     knees  . Coronary artery disease     status post non-ST-elevation myocardial   infarction in May 2008 with treatment of a diagonal branch lesion  with a Taxus drug-eluting stent.   Discharge Diagnoses:   Active Problems:   History of total knee arthroplasty  Estimated body mass index is 33.65 kg/(m^2) as calculated from the following:   Height as of this encounter: _0  (1.753 m).   Weight as of this encounter: 103.42 kg (228 lb).  Procedure:  Procedure(s) (LRB): RIGHT TOTAL KNEE ARTHROPLASTY (Right)   Consults: None  HPI: [name] [name], 74 y.o. male, has a history of pain and functional disability in the right knee due to arthritis and has failed non-surgical conservative treatments for greater than 12 weeks to includeNSAID's and/or analgesics, corticosteriod injections and activity modification. Onset of symptoms was gradual, starting >10 years ago with gradually worsening course since that time. The patient noted no past surgery on the right knee(s). Patient currently rates pain in the right knee(s) at 7 out of 10 with activity. Patient has night pain, worsening of pain with activity and weight bearing, pain that interferes with activities of daily living, pain with passive range of motion, crepitus and joint swelling. Patient has evidence of periarticular osteophytes and joint space narrowing by imaging studies. There is no active infection.  Laboratory Data: Admission on 06/23/2014    Component Date Value Ref Range Status  . ABO/RH(D) 06/23/2014 A POS   Final  . Antibody Screen 06/23/2014 NEG   Final  . Sample Expiration 06/23/2014 06/26/2014   Final  . WBC 06/24/2014 10.9* 4.0 - 10.5 K/uL Final  . RBC 06/24/2014 4.34  4.22 - 5.81 MIL/uL Final  . Hemoglobin 06/24/2014 13.1  13.0 - 17.0 g/dL Final  . HCT 06/24/2014 41.1  39.0 - 52.0 % Final  . MCV 06/24/2014 94.7  78.0 - 100.0 fL Final  . MCH 06/24/2014 30.2  26.0 - 34.0 pg Final  . MCHC 06/24/2014 31.9  30.0 - 36.0 g/dL Final  . RDW 06/24/2014 13.9  11.5 - 15.5 % Final  . Platelets 06/24/2014 175  150 - 400 K/uL Final  . Sodium 06/24/2014 138  135 - 145 mmol/L Final  . Potassium 06/24/2014 4.8  3.5 - 5.1 mmol/L Final  . Chloride 06/24/2014 102  96 - 112 mmol/L Final  . CO2 06/24/2014 30  19 - 32 mmol/L Final  . Glucose, Bld 06/24/2014 154* 70 - 99 mg/dL Final  . BUN 06/24/2014 13  6 - 23 mg/dL Final  . Creatinine, Ser 06/24/2014 1.02  0.50 - 1.35 mg/dL Final  . Calcium 06/24/2014 8.5  8.4 - 10.5 mg/dL Final  . GFR calc non Af Amer 06/24/2014 72* >90 mL/min Final  . GFR calc Af Amer 06/24/2014 83* >90 mL/min Final   Comment: (NOTE) The eGFR has been calculated using the CKD EPI equation. This calculation has not been validated in all clinical situations. eGFR's persistently <90 mL/min  signify possible Chronic Kidney Disease.   . Anion gap 06/24/2014 6  5 - 15 Final  . WBC 06/25/2014 14.7* 4.0 - 10.5 K/uL Final  . RBC 06/25/2014 3.95* 4.22 - 5.81 MIL/uL Final  . Hemoglobin 06/25/2014 11.8* 13.0 - 17.0 g/dL Final  . HCT 06/25/2014 37.1* 39.0 - 52.0 % Final  . MCV 06/25/2014 93.9  78.0 - 100.0 fL Final  . MCH 06/25/2014 29.9  26.0 - 34.0 pg Final  . MCHC 06/25/2014 31.8  30.0 - 36.0 g/dL Final  . RDW 06/25/2014 13.7  11.5 - 15.5 % Final  . Platelets 06/25/2014 179  150 - 400 K/uL Final  . Sodium 06/25/2014 133* 135 - 145 mmol/L Final  . Potassium 06/25/2014 4.3  3.5 - 5.1 mmol/L Final  . Chloride 06/25/2014  97  96 - 112 mmol/L Final  . CO2 06/25/2014 27  19 - 32 mmol/L Final  . Glucose, Bld 06/25/2014 146* 70 - 99 mg/dL Final  . BUN 06/25/2014 19  6 - 23 mg/dL Final  . Creatinine, Ser 06/25/2014 1.49* 0.50 - 1.35 mg/dL Final  . Calcium 06/25/2014 8.2* 8.4 - 10.5 mg/dL Final  . GFR calc non Af Amer 06/25/2014 45* >90 mL/min Final  . GFR calc Af Amer 06/25/2014 53* >90 mL/min Final   Comment: (NOTE) The eGFR has been calculated using the CKD EPI equation. This calculation has not been validated in all clinical situations. eGFR's persistently <90 mL/min signify possible Chronic Kidney Disease.   Georgiann Hahn gap 06/25/2014 9  5 - 15 Final  Hospital Outpatient Visit on 06/18/2014  Component Date Value Ref Range Status  . WBC 06/18/2014 6.6  4.0 - 10.5 K/uL Final  . RBC 06/18/2014 4.80  4.22 - 5.81 MIL/uL Final  . Hemoglobin 06/18/2014 14.4  13.0 - 17.0 g/dL Final  . HCT 06/18/2014 44.9  39.0 - 52.0 % Final  . MCV 06/18/2014 93.5  78.0 - 100.0 fL Final  . MCH 06/18/2014 30.0  26.0 - 34.0 pg Final  . MCHC 06/18/2014 32.1  30.0 - 36.0 g/dL Final  . RDW 06/18/2014 14.0  11.5 - 15.5 % Final  . Platelets 06/18/2014 190  150 - 400 K/uL Final  . Neutrophils Relative % 06/18/2014 48  43 - 77 % Final  . Neutro Abs 06/18/2014 3.2  1.7 - 7.7 K/uL Final  . Lymphocytes Relative 06/18/2014 37  12 - 46 % Final  . Lymphs Abs 06/18/2014 2.4  0.7 - 4.0 K/uL Final  . Monocytes Relative 06/18/2014 13* 3 - 12 % Final  . Monocytes Absolute 06/18/2014 0.8  0.1 - 1.0 K/uL Final  . Eosinophils Relative 06/18/2014 2  0 - 5 % Final  . Eosinophils Absolute 06/18/2014 0.1  0.0 - 0.7 K/uL Final  . Basophils Relative 06/18/2014 0  0 - 1 % Final  . Basophils Absolute 06/18/2014 0.0  0.0 - 0.1 K/uL Final  . Sodium 06/18/2014 139  135 - 145 mmol/L Final  . Potassium 06/18/2014 4.1  3.5 - 5.1 mmol/L Final  . Chloride 06/18/2014 106  96 - 112 mmol/L Final  . CO2 06/18/2014 28  19 - 32 mmol/L Final  . Glucose, Bld 06/18/2014  150* 70 - 99 mg/dL Final  . BUN 06/18/2014 18  6 - 23 mg/dL Final  . Creatinine, Ser 06/18/2014 0.92  0.50 - 1.35 mg/dL Final  . Calcium 06/18/2014 8.7  8.4 - 10.5 mg/dL Final  . Total Protein 06/18/2014 7.1  6.0 - 8.3 g/dL  Final  . Albumin 06/18/2014 4.4  3.5 - 5.2 g/dL Final  . AST 06/18/2014 26  0 - 37 U/L Final  . ALT 06/18/2014 15  0 - 53 U/L Final  . Alkaline Phosphatase 06/18/2014 102  39 - 117 U/L Final  . Total Bilirubin 06/18/2014 0.9  0.3 - 1.2 mg/dL Final  . GFR calc non Af Amer 06/18/2014 83* >90 mL/min Final  . GFR calc Af Amer 06/18/2014 >90  >90 mL/min Final   Comment: (NOTE) The eGFR has been calculated using the CKD EPI equation. This calculation has not been validated in all clinical situations. eGFR's persistently <90 mL/min signify possible Chronic Kidney Disease.   . Anion gap 06/18/2014 5  5 - 15 Final  . Prothrombin Time 06/18/2014 13.4  11.6 - 15.2 seconds Final  . INR 06/18/2014 1.01  0.00 - 1.49 Final  . Color, Urine 06/18/2014 YELLOW  YELLOW Final  . APPearance 06/18/2014 CLEAR  CLEAR Final  . Specific Gravity, Urine 06/18/2014 1.028  1.005 - 1.030 Final  . pH 06/18/2014 5.5  5.0 - 8.0 Final  . Glucose, UA 06/18/2014 NEGATIVE  NEGATIVE mg/dL Final  . Hgb urine dipstick 06/18/2014 NEGATIVE  NEGATIVE Final  . Bilirubin Urine 06/18/2014 NEGATIVE  NEGATIVE Final  . Ketones, ur 06/18/2014 NEGATIVE  NEGATIVE mg/dL Final  . Protein, ur 06/18/2014 NEGATIVE  NEGATIVE mg/dL Final  . Urobilinogen, UA 06/18/2014 0.2  0.0 - 1.0 mg/dL Final  . Nitrite 06/18/2014 NEGATIVE  NEGATIVE Final  . Leukocytes, UA 06/18/2014 NEGATIVE  NEGATIVE Final   MICROSCOPIC NOT DONE ON URINES WITH NEGATIVE PROTEIN, BLOOD, LEUKOCYTES, NITRITE, OR GLUCOSE <1000 mg/dL.  Marland Kitchen aPTT 06/18/2014 32  24 - 37 seconds Final  . MRSA, PCR 06/18/2014 NEGATIVE  NEGATIVE Final  . Staphylococcus aureus 06/18/2014 NEGATIVE  NEGATIVE Final   Comment:        The Xpert SA Assay (FDA approved for NASAL  specimens in patients over 2 years of age), is one component of a comprehensive surveillance program.  Test performance has been validated by [facility] for patients greater than or equal to 30 year old. It is not intended to diagnose infection nor to guide or monitor treatment.      EKG: Orders placed or performed in visit on 11/18/13  . EKG 12-Lead     Hospital Course: [name] [name] is a 74 y.o. who was admitted to [facility]. They were brought to the operating room on 06/23/2014 and underwent Procedure(s): RIGHT TOTAL KNEE ARTHROPLASTY.  Patient tolerated the procedure well and was later transferred to the recovery room and then to the orthopaedic floor for postoperative care.  They were given PO and IV analgesics for pain control following their surgery.  They were given 24 hours of postoperative antibiotics of  Anti-infectives    Start     Dose/Rate Route Frequency Ordered Stop   06/23/14 1830  ceFAZolin (ANCEF) IVPB 1 g/50 mL premix     1 g 100 mL/hr over 30 Minutes Intravenous Every 6 hours 06/23/14 1744 06/24/14 0106   06/23/14 1356  polymyxin B 500,000 Units, bacitracin 50,000 Units in sodium chloride irrigation 0.9 % 500 mL irrigation  Status:  Discontinued       As needed 06/23/14 1357 06/23/14 1621   06/23/14 0952  ceFAZolin (ANCEF) IVPB 2 g/50 mL premix     2 g 100 mL/hr over 30 Minutes Intravenous On call to O.R. 06/23/14 9169 06/23/14 1328     and started  on DVT prophylaxis in the form of Xarelto.   PT and OT were ordered for total joint protocol.  Discharge planning consulted to help with postop disposition and equipment needs.  Patient had a fair night on the evening of surgery. Did have some issues with pain management. They started to get up OOB with therapy on day one. Hemovac drain was pulled without difficulty.  Continued to work with therapy into day two.  Patient was progressing well with PT. Patient was seen in rounds and was ready to go to [facility].   Diet: Cardiac diet Activity:WBAT Follow-up:in 2 weeks Disposition - Skilled nursing facility Discharged Condition: stable   Discharge Instructions    Call MD / Call 911    Complete by:  As directed   If you experience chest pain or shortness of breath, CALL 911 and be transported to the hospital emergency room.  If you develope a fever above 101 F, pus (white drainage) or increased drainage or redness at the wound, or calf pain, call your surgeon's office.     Constipation Prevention    Complete by:  As directed   Drink plenty of fluids.  Prune juice may be helpful.  You may use a stool softener, such as Colace (over the counter) 100 mg twice a day.  Use MiraLax (over the counter) for constipation as needed.     Diet - low sodium heart healthy    Complete by:  As directed      Discharge instructions    Complete by:  As directed   .INSTRUCTIONS AFTER JOINT REPLACEMENT   Remove items at home which could result in a fall. This includes throw rugs or furniture in walking pathways ICE to the affected joint every three hours while awake for 30 minutes at a time, for at least the first 3-5 days, and then as needed for pain and swelling.  Continue to use ice for pain and swelling. You may notice swelling that will progress down to the foot and ankle.  This is normal after surgery.  Elevate your leg when you are not up walking on it.   Continue to use the breathing machine you got in the hospital (incentive spirometer) which will help keep your temperature down.  It is common for your temperature to cycle up and down following surgery, especially at night when you are not up moving around and exerting yourself.  The breathing machine keeps your lungs expanded and your temperature down.   DIET:  As you were doing prior to hospitalization, we recommend a well-balanced diet.  DRESSING / WOUND CARE / SHOWERING  Keep the surgical dressing until follow up.  The dressing is water proof, so  you can shower without any extra covering.  IF THE DRESSING FALLS OFF or the wound gets wet inside, change the dressing with sterile gauze.  Please use good hand washing techniques before changing the dressing.  Do not use any lotions or creams on the incision until instructed by your surgeon.    ACTIVITY  Increase activity slowly as tolerated, but follow the weight bearing instructions below.   No driving for 6 weeks or until further direction given by your physician.  You cannot drive while taking narcotics.  No lifting or carrying greater than 10 lbs. until further directed by your surgeon. Avoid periods of inactivity such as sitting longer than an hour when not asleep. This helps prevent blood clots.  You may return to work once you are  authorized by your doctor.     WEIGHT BEARING   Weight bearing as tolerated with assist device (walker, cane, etc) as directed, use it as long as suggested by your surgeon or therapist, typically at least 4-6 weeks.   EXERCISES  Results after joint replacement surgery are often greatly improved when you follow the exercise, range of motion and muscle strengthening exercises prescribed by your doctor. Safety measures are also important to protect the joint from further injury. Any time any of these exercises cause you to have increased pain or swelling, decrease what you are doing until you are comfortable again and then slowly increase them. If you have problems or questions, call your caregiver or physical therapist for advice.   Rehabilitation is important following a joint replacement. After just a few days of immobilization, the muscles of the leg can become weakened and shrink (atrophy).  These exercises are designed to build up the tone and strength of the thigh and leg muscles and to improve motion. Often times heat used for twenty to thirty minutes before working out will loosen up your tissues and help with improving the range of motion but do not  use heat for the first two weeks following surgery (sometimes heat can increase post-operative swelling).   These exercises can be done on a training (exercise) mat, on the floor, on a table or on a bed. Use whatever works the best and is most comfortable for you.    Use music or television while you are exercising so that the exercises are a pleasant break in your day. This will make your life better with the exercises acting as a break in your routine that you can look forward to.   Perform all exercises about fifteen times, three times per day or as directed.  You should exercise both the operative leg and the other leg as well.   Exercises include:   Quad Sets - Tighten up the muscle on the front of the thigh (Quad) and hold for 5-10 seconds.   Straight Leg Raises - With your knee straight (if you were given a brace, keep it on), lift the leg to 60 degrees, hold for 3 seconds, and slowly lower the leg.  Perform this exercise against resistance later as your leg gets stronger.  Leg Slides: Lying on your back, slowly slide your foot toward your buttocks, bending your knee up off the floor (only go as far as is comfortable). Then slowly slide your foot back down until your leg is flat on the floor again.  Angel Wings: Lying on your back spread your legs to the side as far apart as you can without causing discomfort.  Hamstring Strength:  Lying on your back, push your heel against the floor with your leg straight by tightening up the muscles of your buttocks.  Repeat, but this time bend your knee to a comfortable angle, and push your heel against the floor.  You may put a pillow under the heel to make it more comfortable if necessary.   A rehabilitation program following joint replacement surgery can speed recovery and prevent re-injury in the future due to weakened muscles. Contact your doctor or a physical therapist for more information on knee rehabilitation.    CONSTIPATION  Constipation is  defined medically as fewer than three stools per week and severe constipation as less than one stool per week.  Even if you have a regular bowel pattern at home, your normal regimen is likely to  be disrupted due to multiple reasons following surgery.  Combination of anesthesia, postoperative narcotics, change in appetite and fluid intake all can affect your bowels.   YOU MUST use at least one of the following options; they are listed in order of increasing strength to get the job done.  They are all available over the counter, and you may need to use some, POSSIBLY even all of these options:    Drink plenty of fluids (prune juice may be helpful) and high fiber foods Colace 100 mg by mouth twice a day  Senokot for constipation as directed and as needed Dulcolax (bisacodyl), take with full glass of water  Miralax (polyethylene glycol) once or twice a day as needed.  If you have tried all these things and are unable to have a bowel movement in the first 3-4 days after surgery call either your surgeon or your primary doctor.    If you experience loose stools or diarrhea, hold the medications until you stool forms back up.  If your symptoms do not get better within 1 week or if they get worse, check with your doctor.  If you experience "the worst abdominal pain ever" or develop nausea or vomiting, please contact the office immediately for further recommendations for treatment.   ITCHING:  If you experience itching with your medications, try taking only a single pain pill, or even half a pain pill at a time.  You can also use Benadryl over the counter for itching or also to help with sleep.   TED HOSE STOCKINGS:  Use stockings on both legs until for at least 2 weeks or as directed by physician office. They may be removed at night for sleeping.  MEDICATIONS:  See your medication summary on the "After Visit Summary" that nursing will review with you.  You may have some home medications which will be placed  on hold until you complete the course of blood thinner medication.  It is important for you to complete the blood thinner medication as prescribed.  PRECAUTIONS:  If you experience chest pain or shortness of breath - call 911 immediately for transfer to the hospital emergency department.   If you develop a fever greater that 101 F, purulent drainage from wound, increased redness or drainage from wound, foul odor from the wound/dressing, or calf pain - CONTACT YOUR SURGEON.                                                   FOLLOW-UP APPOINTMENTS:  If you do not already have a post-op appointment, please call the office for an appointment to be seen by your surgeon.  Guidelines for how soon to be seen are listed in your "After Visit Summary", but are typically between 1-4 weeks after surgery.  Do not take vitamins and supplements while taking Xarelto  MAKE SURE YOU:  Understand these instructions.  Get help right away if you are not doing well or get worse.    Thank you for letting us be a part of your medical care team.  It is a privilege we respect greatly.  We hope these instructions will help you stay on track for a fast and full recovery!     Increase activity slowly as tolerated    Complete by:  As directed  Medication List    STOP taking these medications        aspirin EC 81 MG tablet     naproxen sodium 220 MG tablet  Commonly known as:  ANAPROX      TAKE these medications        chlordiazePOXIDE 25 MG capsule  Commonly known as:  LIBRIUM  Take one capsule by mouth once daily     docusate sodium 100 MG capsule  Commonly known as:  COLACE  Take 1 capsule (100 mg total) by mouth 2 (two) times daily.     esomeprazole 40 MG capsule  Commonly known as:  NEXIUM  Take 40 mg by mouth daily as needed (indegistion.).     lisinopril 10 MG tablet  Commonly known as:  PRINIVIL,ZESTRIL  Take 10 mg by mouth every morning.     methocarbamol 500 MG tablet    Commonly known as:  ROBAXIN  Take 1 tablet (500 mg total) by mouth every 6 (six) hours as needed for muscle spasms.     oxyCODONE 5 MG immediate release tablet  Commonly known as:  Oxy IR/ROXICODONE  Take 1-3 tablets (5-15 mg total) by mouth every 4 (four) hours as needed for moderate pain or severe pain.     propranolol ER 60 MG 24 hr capsule  Commonly known as:  INDERAL LA  Take 60 mg by mouth every morning.     rivaroxaban 10 MG Tabs tablet  Commonly known as:  XARELTO  Take 1 tablet (10 mg total) by mouth daily with breakfast.     simvastatin 40 MG tablet  Commonly known as:  ZOCOR  Take 40 mg by mouth every morning.     travoprost (benzalkonium) 0.004 % ophthalmic solution  Commonly known as:  TRAVATAN  Place 1 drop into both eyes daily as needed (eye irritation).           Follow-up Information    Follow up with GIOFFRE,RONALD A, MD. Schedule an appointment as soon as possible for a visit in 2 weeks.   Specialty:  Orthopedic Surgery   Contact information:   [address] [place] [address] [phone]       Signed: Ardeen Jourdain, PA-C Orthopaedic Surgery 06/25/2014, 7:30 AM

## 2014-06-27 ENCOUNTER — Encounter (HOSPITAL_COMMUNITY): Payer: Self-pay | Admitting: Emergency Medicine

## 2014-06-27 ENCOUNTER — Observation Stay (HOSPITAL_COMMUNITY)
Admission: EM | Admit: 2014-06-27 | Discharge: 2014-06-30 | Disposition: A | Discharge status: Skilled Nursing Facility | Payer: Medicare PPO | Attending: Internal Medicine | Admitting: Internal Medicine

## 2014-06-27 ENCOUNTER — Emergency Department (HOSPITAL_COMMUNITY): Payer: Medicare PPO

## 2014-06-27 ENCOUNTER — Other Ambulatory Visit (HOSPITAL_COMMUNITY): Payer: Self-pay

## 2014-06-27 DIAGNOSIS — Z87891 Personal history of nicotine dependence: Secondary | ICD-10-CM | POA: Diagnosis not present

## 2014-06-27 DIAGNOSIS — R4182 Altered mental status, unspecified: Secondary | ICD-10-CM | POA: Diagnosis present

## 2014-06-27 DIAGNOSIS — I10 Essential (primary) hypertension: Secondary | ICD-10-CM | POA: Insufficient documentation

## 2014-06-27 DIAGNOSIS — E785 Hyperlipidemia, unspecified: Secondary | ICD-10-CM | POA: Insufficient documentation

## 2014-06-27 DIAGNOSIS — K219 Gastro-esophageal reflux disease without esophagitis: Secondary | ICD-10-CM | POA: Diagnosis not present

## 2014-06-27 DIAGNOSIS — R0602 Shortness of breath: Secondary | ICD-10-CM

## 2014-06-27 DIAGNOSIS — Z96659 Presence of unspecified artificial knee joint: Secondary | ICD-10-CM

## 2014-06-27 DIAGNOSIS — N179 Acute kidney failure, unspecified: Secondary | ICD-10-CM | POA: Insufficient documentation

## 2014-06-27 DIAGNOSIS — G9340 Encephalopathy, unspecified: Principal | ICD-10-CM | POA: Insufficient documentation

## 2014-06-27 DIAGNOSIS — I252 Old myocardial infarction: Secondary | ICD-10-CM | POA: Diagnosis not present

## 2014-06-27 DIAGNOSIS — Z8249 Family history of ischemic heart disease and other diseases of the circulatory system: Secondary | ICD-10-CM | POA: Insufficient documentation

## 2014-06-27 DIAGNOSIS — I2510 Atherosclerotic heart disease of native coronary artery without angina pectoris: Secondary | ICD-10-CM | POA: Diagnosis not present

## 2014-06-27 DIAGNOSIS — Z96698 Presence of other orthopedic joint implants: Secondary | ICD-10-CM | POA: Diagnosis not present

## 2014-06-27 DIAGNOSIS — Z955 Presence of coronary angioplasty implant and graft: Secondary | ICD-10-CM | POA: Insufficient documentation

## 2014-06-27 DIAGNOSIS — Z96651 Presence of right artificial knee joint: Secondary | ICD-10-CM | POA: Diagnosis not present

## 2014-06-27 LAB — URINALYSIS, ROUTINE W REFLEX MICROSCOPIC
Bilirubin Urine: NEGATIVE
Glucose, UA: NEGATIVE mg/dL
Ketones, ur: NEGATIVE mg/dL
NITRITE: NEGATIVE
PH: 5 (ref 5.0–8.0)
SPECIFIC GRAVITY, URINE: 1.015 (ref 1.005–1.030)
UROBILINOGEN UA: 0.2 mg/dL (ref 0.0–1.0)

## 2014-06-27 LAB — COMPREHENSIVE METABOLIC PANEL
ALBUMIN: 3.2 g/dL — AB (ref 3.5–5.2)
ALT: 17 U/L (ref 0–53)
AST: 38 U/L — AB (ref 0–37)
Alkaline Phosphatase: 75 U/L (ref 39–117)
Anion gap: 9 (ref 5–15)
BUN: 55 mg/dL — ABNORMAL HIGH (ref 6–23)
CALCIUM: 8.4 mg/dL (ref 8.4–10.5)
CHLORIDE: 98 mmol/L (ref 96–112)
CO2: 28 mmol/L (ref 19–32)
Creatinine, Ser: 2.15 mg/dL — ABNORMAL HIGH (ref 0.50–1.35)
GFR calc Af Amer: 34 mL/min — ABNORMAL LOW (ref >90)
GFR, EST NON AFRICAN AMERICAN: 29 mL/min — AB (ref >90)
Glucose, Bld: 140 mg/dL — ABNORMAL HIGH (ref 70–99)
Potassium: 4 mmol/L (ref 3.5–5.1)
Sodium: 135 mmol/L (ref 135–145)
Total Bilirubin: 0.8 mg/dL (ref 0.3–1.2)
Total Protein: 6.8 g/dL (ref 6.0–8.3)

## 2014-06-27 LAB — URINE MICROSCOPIC-ADD ON

## 2014-06-27 LAB — CBC WITH DIFFERENTIAL/PLATELET
BASOS ABS: 0 10*3/uL (ref 0.0–0.1)
Basophils Relative: 0 % (ref 0–1)
Eosinophils Absolute: 0.2 10*3/uL (ref 0.0–0.7)
Eosinophils Relative: 2 % (ref 0–5)
HCT: 30.4 % — ABNORMAL LOW (ref 39.0–52.0)
Hemoglobin: 10 g/dL — ABNORMAL LOW (ref 13.0–17.0)
Lymphocytes Relative: 27 % (ref 12–46)
Lymphs Abs: 2 10*3/uL (ref 0.7–4.0)
MCH: 30.7 pg (ref 26.0–34.0)
MCHC: 32.9 g/dL (ref 30.0–36.0)
MCV: 93.3 fL (ref 78.0–100.0)
MONO ABS: 1.4 10*3/uL — AB (ref 0.1–1.0)
Monocytes Relative: 18 % — ABNORMAL HIGH (ref 3–12)
NEUTROS ABS: 4 10*3/uL (ref 1.7–7.7)
NEUTROS PCT: 53 % (ref 43–77)
Platelets: 168 10*3/uL (ref 150–400)
RBC: 3.26 MIL/uL — ABNORMAL LOW (ref 4.22–5.81)
RDW: 13.6 % (ref 11.5–15.5)
WBC: 7.6 10*3/uL (ref 4.0–10.5)

## 2014-06-27 MED ORDER — SODIUM CHLORIDE 0.9 % IV SOLN
INTRAVENOUS | Status: DC
  Administered 2014-06-27: 23:00:00 via INTRAVENOUS

## 2014-06-27 MED ORDER — SODIUM CHLORIDE 0.9 % IV BOLUS (SEPSIS)
1000.0000 mL | Freq: Once | INTRAVENOUS | Status: AC
Start: 2014-06-27 — End: 2014-06-27
  Administered 2014-06-27: 1000 mL via INTRAVENOUS

## 2014-06-27 NOTE — ED Provider Notes (Signed)
CSN: [id number]     Arrival date & time 06/27/14  2021 History   First MD Initiated Contact with Patient 06/27/14 2022     Chief Complaint  Patient presents with  . Altered Mental Status     (Consider location/radiation/quality/duration/timing/severity/associated sxs/prior Treatment) HPI Comments: Patient here with progressive decreased level of consciousness 3 days. Patient recently had a right knee replacement and is on multiple medications including Librium as well as every 4 hour OxyContin. No reported fever, vomiting, head injury. He is also on a blood thinner for DVT prophylaxis. Symptoms have been progressively worse. CBG was 143 per EMS. Patient transported here  Patient is a 74 y.o. male presenting with altered mental status. The history is provided by the patient. The history is limited by the condition of the patient.  Altered Mental Status   Past Medical History  Diagnosis Date  . Hyperlipidemia     with low HDL, improved on simvastatin.  Marland Kitchen. Hypertension   . Tremors of nervous system   . Myocardial infarction 2008  . GERD (gastroesophageal reflux disease)     rare, mild  . Glaucoma   . Arthritis     knees  . Coronary artery disease     status post non-ST-elevation myocardial   infarction in May 2008 with treatment of a diagonal branch lesion  with a Taxus drug-eluting stent.   Past Surgical History  Procedure Laterality Date  . Knee surgery Bilateral   . Shoulder surgery Bilateral     rotator cuff repair  . Hand surgery Right     replacement of 2 knuckles  . Elbow surgery Right     tendon surgery  . Cardiac catheterization  2008  . Coronary angioplasty  2008  . Eye surgery Bilateral last 3 years ago     laser surgery x2 each- relieves pressure  . Back surgery      lower  . Total knee arthroplasty Left 12/03/2013    Procedure: LEFT TOTAL KNEE ARTHROPLASTY;  Surgeon: Jacki Conesonald A Gioffre, MD;  Location: [facility];  Service: Orthopedics;  Laterality: Left;  . Coronary  stents     . Total knee arthroplasty Right 06/23/2014    Procedure: RIGHT TOTAL KNEE ARTHROPLASTY;  Surgeon: Ranee Gosselinonald Gioffre, MD;  Location: [facility];  Service: Orthopedics;  Laterality: Right;   Family History  Problem Relation Age of Onset  . Dementia Mother   . Heart failure Father    History  Substance Use Topics  . Smoking status: Former Smoker    Types: Cigarettes    Quit date: 07/27/1980  . Smokeless tobacco: Never Used  . Alcohol Use: No    Review of Systems  Unable to perform ROS     Allergies  Review of patient's allergies indicates no known allergies.  Home Medications   Prior to Admission medications   Medication Sig Start Date End Date Taking? Authorizing Provider  chlordiazePOXIDE (LIBRIUM) 25 MG capsule Take one capsule by mouth once daily 12/05/13   Sharee Holstereborah S Green, NP  docusate sodium (COLACE) 100 MG capsule Take 1 capsule (100 mg total) by mouth 2 (two) times daily. 06/25/14   Amber Celedonio Savageonstable, PA-C  esomeprazole (NEXIUM) 40 MG capsule Take 40 mg by mouth daily as needed (indegistion.).     Historical Provider, MD  lisinopril (PRINIVIL,ZESTRIL) 10 MG tablet Take 10 mg by mouth every morning.    Historical Provider, MD  methocarbamol (ROBAXIN) 500 MG tablet Take 1 tablet (500 mg total) by mouth every 6 (six)  hours as needed for muscle spasms. 06/25/14   Amber Celedonio Savage, PA-C  oxyCODONE (OXY IR/ROXICODONE) 5 MG immediate release tablet Take 1-3 tablets (5-15 mg total) by mouth every 4 (four) hours as needed for moderate pain or severe pain. 06/25/14   Amber Constable, PA-C  propranolol ER (INDERAL LA) 60 MG 24 hr capsule Take 60 mg by mouth every morning.    Historical Provider, MD  rivaroxaban (XARELTO) 10 MG TABS tablet Take 1 tablet (10 mg total) by mouth daily with breakfast. 06/25/14   Dimitri Ped, PA-C  simvastatin (ZOCOR) 40 MG tablet Take 40 mg by mouth every morning.    Historical Provider, MD  travoprost, benzalkonium, (TRAVATAN) 0.004 % ophthalmic solution  Place 1 drop into both eyes daily as needed (eye irritation).     Historical Provider, MD   BP 105/45 mmHg  Pulse 81  Temp(Src) 99.1 F (37.3 C) (Rectal)  Resp 18  SpO2 94% Physical Exam  Constitutional: He appears well-developed and well-nourished. He appears lethargic. He is cooperative.  Non-toxic appearance. No distress.  HENT:  Head: Normocephalic and atraumatic.  Eyes: Conjunctivae, EOM and lids are normal. Pupils are equal, round, and reactive to light.  Neck: Normal range of motion. Neck supple. No tracheal deviation present. No thyroid mass present.  Cardiovascular: Normal rate, regular rhythm and normal heart sounds.  Exam reveals no gallop.   No murmur heard. Pulmonary/Chest: Effort normal and breath sounds normal. No stridor. No respiratory distress. He has no decreased breath sounds. He has no wheezes. He has no rhonchi. He has no rales.  Abdominal: Soft. Normal appearance and bowel sounds are normal. He exhibits no distension. There is no tenderness. There is no rebound and no CVA tenderness.  Musculoskeletal: Normal range of motion. He exhibits no edema or tenderness.       Legs: Neurological: He has normal strength. He appears lethargic. No cranial nerve deficit or sensory deficit. GCS eye subscore is 4. GCS verbal subscore is 5. GCS motor subscore is 6.  Skin: Skin is warm and dry. No abrasion and no rash noted.  Psychiatric: He has a normal mood and affect. His speech is normal and behavior is normal.  Nursing note and vitals reviewed.   ED Course  Procedures (including critical care time) Labs Review Labs Reviewed  URINE CULTURE  CBC WITH DIFFERENTIAL/PLATELET  COMPREHENSIVE METABOLIC PANEL  URINALYSIS, ROUTINE W REFLEX MICROSCOPIC    Imaging Review No results found.   EKG Interpretation None      MDM   Final diagnoses:  Altered mental status    Patient will be given IV fluids here for his dehydration. No evidence of infection at this time. Will be  admitted to the hospital    Lorre Nick, MD 06/27/14 2209

## 2014-06-27 NOTE — ED Notes (Signed)
Bed: [id number] Expected date:  Expected time:  Means of arrival:  Comments: EMS 73yo M altered mental status

## 2014-06-27 NOTE — ED Notes (Signed)
Pt arrived to the ED by [facility] with a complaint of Altered Mental Status.  Pt recently had right knee surgery.  At rehabilitation facility staff began noticing on Thursday that the patient was making random comments that were not making sense.  Pt is alert to self but not to place or time.  Pt has been hypotensive at the rehabilitation facility.  CBG per EMS was 143.  Pt last medicated at 1645 hrs with 5 mg IR oxycodone.

## 2014-06-28 ENCOUNTER — Encounter (HOSPITAL_COMMUNITY): Payer: Self-pay | Admitting: Internal Medicine

## 2014-06-28 DIAGNOSIS — I10 Essential (primary) hypertension: Secondary | ICD-10-CM

## 2014-06-28 DIAGNOSIS — E860 Dehydration: Secondary | ICD-10-CM

## 2014-06-28 DIAGNOSIS — N179 Acute kidney failure, unspecified: Secondary | ICD-10-CM | POA: Diagnosis not present

## 2014-06-28 DIAGNOSIS — I2510 Atherosclerotic heart disease of native coronary artery without angina pectoris: Secondary | ICD-10-CM | POA: Diagnosis not present

## 2014-06-28 DIAGNOSIS — G9340 Encephalopathy, unspecified: Secondary | ICD-10-CM

## 2014-06-28 DIAGNOSIS — Z96651 Presence of right artificial knee joint: Secondary | ICD-10-CM

## 2014-06-28 LAB — COMPREHENSIVE METABOLIC PANEL
ALBUMIN: 3 g/dL — AB (ref 3.5–5.2)
ALT: 22 U/L (ref 0–53)
ANION GAP: 8 (ref 5–15)
AST: 45 U/L — AB (ref 0–37)
Alkaline Phosphatase: 78 U/L (ref 39–117)
BUN: 42 mg/dL — ABNORMAL HIGH (ref 6–23)
CO2: 28 mmol/L (ref 19–32)
Calcium: 8.8 mg/dL (ref 8.4–10.5)
Chloride: 104 mmol/L (ref 96–112)
Creatinine, Ser: 1.56 mg/dL — ABNORMAL HIGH (ref 0.50–1.35)
GFR calc Af Amer: 50 mL/min — ABNORMAL LOW (ref >90)
GFR calc non Af Amer: 43 mL/min — ABNORMAL LOW (ref >90)
Glucose, Bld: 144 mg/dL — ABNORMAL HIGH (ref 70–99)
Potassium: 4.8 mmol/L (ref 3.5–5.1)
SODIUM: 140 mmol/L (ref 135–145)
Total Bilirubin: 1.2 mg/dL (ref 0.3–1.2)
Total Protein: 7 g/dL (ref 6.0–8.3)

## 2014-06-28 LAB — TSH: TSH: 2.027 u[IU]/mL (ref 0.350–4.500)

## 2014-06-28 LAB — MRSA PCR SCREENING: MRSA by PCR: NEGATIVE

## 2014-06-28 LAB — CBC
HEMATOCRIT: 31.4 % — AB (ref 39.0–52.0)
HEMOGLOBIN: 10 g/dL — AB (ref 13.0–17.0)
MCH: 30.3 pg (ref 26.0–34.0)
MCHC: 31.8 g/dL (ref 30.0–36.0)
MCV: 95.2 fL (ref 78.0–100.0)
Platelets: 211 10*3/uL (ref 150–400)
RBC: 3.3 MIL/uL — ABNORMAL LOW (ref 4.22–5.81)
RDW: 13.8 % (ref 11.5–15.5)
WBC: 6.7 10*3/uL (ref 4.0–10.5)

## 2014-06-28 LAB — PHOSPHORUS: Phosphorus: 2.8 mg/dL (ref 2.3–4.6)

## 2014-06-28 LAB — MAGNESIUM: Magnesium: 2.5 mg/dL (ref 1.5–2.5)

## 2014-06-28 LAB — TROPONIN I: Troponin I: <0.03 ng/mL (ref <0.031)

## 2014-06-28 MED ORDER — RIVAROXABAN 10 MG PO TABS
10.0000 mg | ORAL_TABLET | Freq: Every day | ORAL | Status: DC
  Administered 2014-06-28 – 2014-06-30 (×3): 10 mg via ORAL
  Filled 2014-06-28 (×4): qty 1

## 2014-06-28 MED ORDER — ONDANSETRON HCL 4 MG/2ML IJ SOLN: 4.0000 mg | Freq: Four times a day (QID) | INTRAMUSCULAR | Status: DC | PRN

## 2014-06-28 MED ORDER — SODIUM CHLORIDE 0.9 % IJ SOLN
3.0000 mL | Freq: Two times a day (BID) | INTRAMUSCULAR | Status: DC
Start: 2014-06-28 — End: 2014-06-30

## 2014-06-28 MED ORDER — SODIUM CHLORIDE 0.9 % IV SOLN
INTRAVENOUS | Status: DC
  Administered 2014-06-28 (×2): via INTRAVENOUS

## 2014-06-28 MED ORDER — TRAVOPROST 0.004 % OP SOLN: 1.0000 [drp] | Freq: Every day | OPHTHALMIC | Status: DC | PRN

## 2014-06-28 MED ORDER — CHLORDIAZEPOXIDE HCL 5 MG PO CAPS
20.0000 mg | ORAL_CAPSULE | Freq: Every day | ORAL | Status: DC
  Administered 2014-06-28 – 2014-06-30 (×3): 20 mg via ORAL
  Filled 2014-06-28 (×4): qty 4

## 2014-06-28 MED ORDER — HYDROCODONE-ACETAMINOPHEN 5-325 MG PO TABS
1.0000 | ORAL_TABLET | ORAL | Status: DC | PRN
  Administered 2014-06-28: 2 via ORAL
  Filled 2014-06-28: qty 2

## 2014-06-28 MED ORDER — PROPRANOLOL HCL ER 60 MG PO CP24
60.0000 mg | ORAL_CAPSULE | Freq: Every morning | ORAL | Status: DC
  Administered 2014-06-28 – 2014-06-30 (×3): 60 mg via ORAL
  Filled 2014-06-28 (×3): qty 1

## 2014-06-28 MED ORDER — IBUPROFEN 200 MG PO TABS
400.0000 mg | ORAL_TABLET | Freq: Four times a day (QID) | ORAL | Status: DC
  Administered 2014-06-28 – 2014-06-30 (×6): 400 mg via ORAL
  Filled 2014-06-28 (×6): qty 2

## 2014-06-28 MED ORDER — ONDANSETRON HCL 4 MG PO TABS
4.0000 mg | ORAL_TABLET | Freq: Four times a day (QID) | ORAL | Status: DC | PRN
Start: 2014-06-28 — End: 2014-06-30

## 2014-06-28 MED ORDER — LATANOPROST 0.005 % OP SOLN: 1.0000 [drp] | Freq: Every day | OPHTHALMIC | Status: DC | PRN

## 2014-06-28 MED ORDER — ASPIRIN EC 81 MG PO TBEC
81.0000 mg | DELAYED_RELEASE_TABLET | Freq: Every day | ORAL | Status: DC
  Administered 2014-06-28 – 2014-06-30 (×3): 81 mg via ORAL
  Filled 2014-06-28 (×3): qty 1

## 2014-06-28 MED ORDER — DOCUSATE SODIUM 100 MG PO CAPS
100.0000 mg | ORAL_CAPSULE | Freq: Two times a day (BID) | ORAL | Status: DC
  Administered 2014-06-28 – 2014-06-29 (×3): 100 mg via ORAL
  Filled 2014-06-28 (×4): qty 1

## 2014-06-28 MED ORDER — TRAMADOL HCL 50 MG PO TABS
100.0000 mg | ORAL_TABLET | Freq: Four times a day (QID) | ORAL | Status: DC | PRN
  Administered 2014-06-30: 100 mg via ORAL
  Filled 2014-06-28: qty 2

## 2014-06-28 MED ORDER — PANTOPRAZOLE SODIUM 40 MG PO TBEC
40.0000 mg | DELAYED_RELEASE_TABLET | Freq: Every day | ORAL | Status: DC
  Administered 2014-06-28 – 2014-06-30 (×3): 40 mg via ORAL
  Filled 2014-06-28 (×3): qty 1

## 2014-06-28 MED ORDER — SIMVASTATIN 40 MG PO TABS
40.0000 mg | ORAL_TABLET | Freq: Every morning | ORAL | Status: DC
  Administered 2014-06-28 – 2014-06-29 (×2): 40 mg via ORAL
  Filled 2014-06-28 (×2): qty 1

## 2014-06-28 MED ORDER — ACETAMINOPHEN 325 MG PO TABS
650.0000 mg | ORAL_TABLET | Freq: Four times a day (QID) | ORAL | Status: DC | PRN
Start: 2014-06-28 — End: 2014-06-30

## 2014-06-28 MED ORDER — TRAMADOL HCL 50 MG PO TABS: 100.0000 mg | ORAL_TABLET | Freq: Four times a day (QID) | ORAL | Status: DC | PRN

## 2014-06-28 MED ORDER — ACETAMINOPHEN 650 MG RE SUPP
650.0000 mg | Freq: Four times a day (QID) | RECTAL | Status: DC | PRN
Start: 2014-06-28 — End: 2014-06-30

## 2014-06-28 MED ORDER — METHOCARBAMOL 500 MG PO TABS: 500.0000 mg | ORAL_TABLET | Freq: Four times a day (QID) | ORAL | Status: DC | PRN

## 2014-06-28 MED ORDER — SODIUM CHLORIDE 0.9 % IV SOLN
INTRAVENOUS | Status: DC
Start: 2014-06-28 — End: 2014-06-30
  Administered 2014-06-28: 22:00:00 via INTRAVENOUS

## 2014-06-28 NOTE — Progress Notes (Signed)
Pt was originally pleasently confused. Pt has since become hostile. Pt insisted on standing up and walking to the bathroom threatening to urinate on the nurses. Pt pulled himself to side of bed and stood. RN and Tech had to catch patient by armpits and hold 90% of his body weight. Pt refused to sit down and began stepping to bathroom. Pt with the support of 2 staff members carrying most of his body weight made it to the bathroom where he proceeded to urinate on the floor and staff's shoes. Pt refused to walk back to bed and began shoving at nurse and nurse tech. Security had to be called and patient walked with help of RN and Tech and 2 security members back to the bed. Pt did not want to sit in the bed. Eventually he complied after patient began complaining of 10/10 knee pain. Pt was given vicdodin x 2. Bed alarm on. Will give report to day nurse. Sitter was ordered.

## 2014-06-28 NOTE — Clinical Social Work Psychosocial (Signed)
Clinical Social Work Department BRIEF PSYCHOSOCIAL ASSESSMENT 06/28/2014  Patient:  [name] [name],[name] [name]     Account Number:  [id number]     Admit date:  06/27/2014  Clinical Social Worker:  Dede Query, CLINICAL SOCIAL WORKER  Date/Time:  06/28/2014 04:15 PM  Referred by:  Physician  Date Referred:  06/26/2014 Referred for  SNF Placement   Other Referral:   Interview type:  Patient Other interview type:   and pt's sister nina at bedside    PSYCHOSOCIAL DATA Living Status:  ALONE Admitted from facility:  [place] Level of care:  [place] Primary support name:  Gae Bon Primary support relationship to patient:  S[place] Degree of support available:   High/sister is involved and available    CURRENT CONCERNS  Other Concerns:    SOCIAL WORK ASSESSMENT / PLAN CSW met with pt and his sister at bedside.  CSW provided description of role and prompted pt's sister to discuss history and needs.  CSW encouraged pt and pt's sister to discuss thoughts and feelings related to pt's illness and rehab.  CSW provided supportive listening and agreed to send pt information to [facility].   Assessment/plan status:  Psychosocial Support/Ongoing Assessment of Needs Other assessment/ plan:   Information/referral to community resources:   Send clinicals to [facility]    PATIENT'S/FAMILY'S RESPONSE TO PLAN OF CARE: Pt confused and not making sense currently has a Actuary. Pt's sister discussed pt was "fine" and he just left hospital last Thursday and she took him to [facility] for rehab.  Pt's sister thinks that pt was over medicated and that led to his contrusion.  Pt's sister wants pt to go back to [facility] at discharge.  Pt's sister grateful for CSW support   .Dede Query, LCSW [facility] Clinical Social Worker - Weekend Coverage cell #: [phone]

## 2014-06-28 NOTE — Progress Notes (Addendum)
[facility] hospitalist follow-up note  This is a 74 year old male who was admitted by my partner last night for new onset confusion and acute renal failure. He recently had a right total knee replacement on 06/23/14 and was discharged to nursing facility on 06/25/14. He is discharged on oxycodone and Librium which he was apparently taking prior to admission.  On exam today he continues to be slightly confused. He is aware that he is in the hospital but is confused to time and to why he is here.  Exam:-  lungs are clear, heart regular rate and rhythm no murmurs. Wearing a brace on right knee  Principal Problem:   Encephalopathy - CT head in the ER is negative - Most likely related to narcotics-oxycodone discontinued but started on hydrocodone which may cause continued confusion -we'll stop hydrocodone as well and placed on ibuprofen for pain control for now- if unable to control pain with NSAIDs, can try tramadol - Am not quite certain why he is taking Librium at home but he may have underlying behavioral problems   Active Problems:    ARF (acute renal failure) - Has improved with hydration -will cut fluids back to 50 mL an hour    HYPERTENSION, CONTROLLED - Lisinopril held due to acute renal failure -Continue propranolol    History of total knee arthroplasty -Continue Xarelto for DVT prophylaxis  Calvert CantorSaima Tausha Milhoan, MD Pager: [email or web address]

## 2014-06-28 NOTE — H&P (Addendum)
PCP:  Marin Comment, FNP [facility]  Referring provider Freida Busman   Chief Complaint:  confusion  HPI: [name] [name] is a 74 y.o. male   has a past medical history of Hyperlipidemia; Hypertension; Tremors of nervous system; Myocardial infarction (2008); GERD (gastroesophageal reflux disease); Glaucoma; Arthritis; and Coronary artery disease.   Presented with  Patient was recently admitted to orthopedic service for right total knee replacement which was done on 06/23/2014. He was discharged to nursing home facility and 21st of April. 2 days later he was transferred to emergency department by EMS for altered mental status. Per records he has also had some episodes of hypotension while at [facility]. In the ER and was noted that his creatinine has gone up from 1.5 but the discharge up to 2.15 he appears to be not eating and drinking well. Patient himself unable to provide any further history due to ongoing confusion  Hospitalist was called for admission for dehydration and encephalopathy  Review of Systems:  Unable to obtain due to confusion Past Medical History: Past Medical History  Diagnosis Date  . Hyperlipidemia     with low HDL, improved on simvastatin.  Marland Kitchen Hypertension   . Tremors of nervous system   . Myocardial infarction 2008  . GERD (gastroesophageal reflux disease)     rare, mild  . Glaucoma   . Arthritis     knees  . Coronary artery disease     status post non-ST-elevation myocardial   infarction in May 2008 with treatment of a diagonal branch lesion  with a Taxus drug-eluting stent.   Past Surgical History  Procedure Laterality Date  . Knee surgery Bilateral   . Shoulder surgery Bilateral     rotator cuff repair  . Hand surgery Right     replacement of 2 knuckles  . Elbow surgery Right     tendon surgery  . Cardiac catheterization  2008  . Coronary angioplasty  2008  . Eye surgery Bilateral last 3 years ago     laser  surgery x2 each- relieves pressure  . Back surgery      lower  . Total knee arthroplasty Left 12/03/2013    Procedure: LEFT TOTAL KNEE ARTHROPLASTY;  Surgeon: Jacki Cones, MD;  Location: [facility];  Service: Orthopedics;  Laterality: Left;  . Coronary stents     . Total knee arthroplasty Right 06/23/2014    Procedure: RIGHT TOTAL KNEE ARTHROPLASTY;  Surgeon: Ranee Gosselin, MD;  Location: [facility];  Service: Orthopedics;  Laterality: Right;     Medications: Prior to Admission medications   Medication Sig Start Date End Date Taking? Authorizing Provider  chlordiazePOXIDE (LIBRIUM) 25 MG capsule Take one capsule by mouth once daily 12/05/13  Yes Sharee Holster, NP  docusate sodium (COLACE) 100 MG capsule Take 1 capsule (100 mg total) by mouth 2 (two) times daily. 06/25/14  Yes Amber Celedonio Savage, PA-C  esomeprazole (NEXIUM) 40 MG capsule Take 40 mg by mouth daily as needed (indegistion.).    Yes Historical Provider, MD  lisinopril (PRINIVIL,ZESTRIL) 10 MG tablet Take 10 mg by mouth every morning.   Yes Historical Provider, MD  oxyCODONE (OXY IR/ROXICODONE) 5 MG immediate release tablet Take 1-3 tablets (5-15 mg total) by mouth every 4 (four) hours as needed for moderate pain or severe pain. 06/25/14  Yes Amber Celedonio Savage, PA-C  propranolol ER (INDERAL LA) 60 MG 24 hr capsule Take 60 mg by mouth every morning.   Yes Historical Provider, MD  rivaroxaban (XARELTO) 10 MG TABS tablet Take 1 tablet (10 mg total) by mouth daily with breakfast. 06/25/14  Yes Amber Constable, PA-C  simvastatin (ZOCOR) 40 MG tablet Take 40 mg by mouth every morning.   Yes Historical Provider, MD  methocarbamol (ROBAXIN) 500 MG tablet Take 1 tablet (500 mg total) by mouth every 6 (six) hours as needed for muscle spasms. 06/25/14   Amber Constable, PA-C  travoprost, benzalkonium, (TRAVATAN) 0.004 % ophthalmic solution Place 1 drop into both eyes daily as needed (eye irritation).     Historical Provider, MD    Allergies:  No Known  Allergies  Social History:  Ambulatory walker  From facility [facility]   reports that he quit smoking about 33 years ago. His smoking use included Cigarettes. He has never used smokeless tobacco. He reports that he does not drink alcohol or use illicit drugs.    Family History: family history includes Dementia in his mother; Heart failure in his father.    Physical Exam: Patient Vitals for the past 24 hrs:  BP Temp Temp src Pulse Resp SpO2  06/28/14 0100 (!) 125/53 mmHg - - 89 11 (!) 76 %  06/28/14 0055 (!) 109/50 mmHg - - 93 13 94 %  06/27/14 2245 (!) 112/51 mmHg - - 83 12 95 %  06/27/14 2240 (!) 139/118 mmHg - - 81 26 97 %  06/27/14 2036 (!) 105/45 mmHg 99.1 F (37.3 C) Rectal 81 18 94 %    1. General:  in No Acute distress 2. Psychological: Alert and but not Oriented intermittently intermittently oriented to place patient appears to be doing better 3. Head/ENT:     Dry Mucous Membranes                          Head Non traumatic, neck supple                          Normal *  Dentition 4. SKIN:  decreased Skin turgor,  Skin clean Dry and intact no rash 5. Heart: Regular rate and rhythm no Murmur, Rub or gallop 6. Lungs:   no wheezes or crackles   7. Abdomen: Soft, non-tender, Non distended 8. Lower extremities: no clubbing, cyanosis, or edema right lower extremity in a brace 9. Neurologically strength 5 out of 5 in all 4 extremities although right lower extremity slightly difficult to assess but appears to be intact. Cranial nerves II through XII intact 10. MSK: Normal range of motion  body mass index is unknown because there is no weight on file.   Labs on Admission:   Results for orders placed or performed during the hospital encounter of 06/27/14 (from the past 24 hour(s))  CBC with Differential/Platelet     Status: Abnormal   Collection Time: 06/27/14  9:10 PM  Result Value Ref Range   WBC 7.6 4.0 - 10.5 K/uL   RBC 3.26 (L) 4.22 - 5.81 MIL/uL   Hemoglobin  10.0 (L) 13.0 - 17.0 g/dL   HCT 16.1 (L) 09.6 - 04.5 %   MCV 93.3 78.0 - 100.0 fL   MCH 30.7 26.0 - 34.0 pg   MCHC 32.9 30.0 - 36.0 g/dL   RDW 40.9 81.1 - 91.4 %   Platelets 168 150 - 400 K/uL   Neutrophils Relative % 53 43 - 77 %   Neutro Abs 4.0 1.7 - 7.7 K/uL   Lymphocytes Relative 27 12 -  46 %   Lymphs Abs 2.0 0.7 - 4.0 K/uL   Monocytes Relative 18 (H) 3 - 12 %   Monocytes Absolute 1.4 (H) 0.1 - 1.0 K/uL   Eosinophils Relative 2 0 - 5 %   Eosinophils Absolute 0.2 0.0 - 0.7 K/uL   Basophils Relative 0 0 - 1 %   Basophils Absolute 0.0 0.0 - 0.1 K/uL  Comprehensive metabolic panel     Status: Abnormal   Collection Time: 06/27/14  9:10 PM  Result Value Ref Range   Sodium 135 135 - 145 mmol/L   Potassium 4.0 3.5 - 5.1 mmol/L   Chloride 98 96 - 112 mmol/L   CO2 28 19 - 32 mmol/L   Glucose, Bld 140 (H) 70 - 99 mg/dL   BUN 55 (H) 6 - 23 mg/dL   Creatinine, Ser 1.612.15 (H) 0.50 - 1.35 mg/dL   Calcium 8.4 8.4 - 09.610.5 mg/dL   Total Protein 6.8 6.0 - 8.3 g/dL   Albumin 3.2 (L) 3.5 - 5.2 g/dL   AST 38 (H) 0 - 37 U/L   ALT 17 0 - 53 U/L   Alkaline Phosphatase 75 39 - 117 U/L   Total Bilirubin 0.8 0.3 - 1.2 mg/dL   GFR calc non Af Amer 29 (L) >90 mL/min   GFR calc Af Amer 34 (L) >90 mL/min   Anion gap 9 5 - 15  Urinalysis, Routine w reflex microscopic     Status: Abnormal   Collection Time: 06/27/14  9:21 PM  Result Value Ref Range   Color, Urine YELLOW YELLOW   APPearance CLOUDY (A) CLEAR   Specific Gravity, Urine 1.015 1.005 - 1.030   pH 5.0 5.0 - 8.0   Glucose, UA NEGATIVE NEGATIVE mg/dL   Hgb urine dipstick MODERATE (A) NEGATIVE   Bilirubin Urine NEGATIVE NEGATIVE   Ketones, ur NEGATIVE NEGATIVE mg/dL   Protein, ur TRACE (A) NEGATIVE mg/dL   Urobilinogen, UA 0.2 0.0 - 1.0 mg/dL   Nitrite NEGATIVE NEGATIVE   Leukocytes, UA TRACE (A) NEGATIVE  Urine microscopic-add on     Status: Abnormal   Collection Time: 06/27/14  9:21 PM  Result Value Ref Range   Squamous Epithelial /  LPF RARE RARE   WBC, UA 0-2 <3 WBC/hpf   RBC / HPF 0-2 <3 RBC/hpf   Casts GRANULAR CAST (A) NEGATIVE   Urine-Other LESS THAN 10 mL OF URINE SUBMITTED     UA concentrated but no evidence of UTI  No results found for: HGBA1C  Estimated Creatinine Clearance: 37.4 mL/min (by C-G formula based on Cr of 2.15).  BNP (last 3 results) No results for input(s): PROBNP in the last 8760 hours.  Other results:  I have pearsonaly reviewed this: ECG REPORT  Heart rate 83 sinus rhythm early repolarization noted  QTC 460  Filed Weights   06/28/14 0245  Weight: 102.649 kg (226 lb 4.8 oz)     Cultures: No results found for: SDES, SPECREQUEST, CULT, REPTSTATUS   Radiological Exams on Admission: Dg Chest 2 View  06/27/2014   CLINICAL DATA:  Altered mental status. Possible pain medication related.  EXAM: CHEST  2 VIEW  COMPARISON:  Chest radiograph 11/27/2013.  FINDINGS: Mild cardiac enlargement. Low lung volumes. No active infiltrates or failure. Atherosclerosis of the aorta. Negative osseous structures. Advanced degenerative change thoracic spine. Stable exam from priors.  IMPRESSION: Cardiomegaly, low lung volumes. No definite active infiltrates or failure.   Electronically Signed   By: Unice BaileyJohn  Curnes M.D.  On: 06/27/2014 22:05   Ct Head Wo Contrast  06/27/2014   CLINICAL DATA:  Altered mental status.  Recent knee surgery.  EXAM: CT HEAD WITHOUT CONTRAST  TECHNIQUE: Contiguous axial images were obtained from the base of the skull through the vertex without intravenous contrast.  COMPARISON:  None.  FINDINGS: No evidence for acute infarction, hemorrhage, mass lesion, hydrocephalus, or extra-axial fluid. Normal for age cerebral volume. Slight hypoattenuation of white matter, likely chronic microvascular ischemic change. Calvarium intact. Mild vascular calcification. No sinus or mastoid air fluid level.  IMPRESSION: Negative exam.   Electronically Signed   By: Davonna Belling M.D.   On: 06/27/2014 21:40     Chart has been reviewed  Family at  Bedside    Assessment/Plan 74 year old gentleman with recent knee operation started to have progressive confusion. Patient was noted to be on Librium as well as narcotic pain medications. It is likely his encephalopathy secondary to polypharmacy  Present on Admission:  . HYPERTENSION, CONTROLLED continue home medications but hold lisinopril given worsening renal function  . Encephalopathy will try to decrease down dose of  pain medication, it seems that patient has been on Librium for many years now. Perhaps combination of narcotic pain medication together Librium has contributed to sedation CT scan of the head is unremarkable. He findings persist consider further imaging she is neurologically intact which is reassuring.  As part of workup will obtain EKG and cardiac markers . Dehydration administer IV fluids Acute on chronic renal failure likely secondary to dehydration will administer IV fluids  Prophylaxis:   Lovenox   CODE STATUS:  FULL CODE    Disposition:                            Back to current facility when stable                      Other plan as per orders.  I have spent a total of 55 min on this admission  Krystine Pabst 06/28/2014, 1:29 AM  [facility]  Pager [phone]   after 2 AM please page floor coverage PA If 7AM-7PM, please contact the day team taking care of the patient  [email or web address]  Password [email or web address]

## 2014-06-28 NOTE — ED Notes (Signed)
Condom catheter was placed.

## 2014-06-29 DIAGNOSIS — I10 Essential (primary) hypertension: Secondary | ICD-10-CM | POA: Diagnosis not present

## 2014-06-29 DIAGNOSIS — N179 Acute kidney failure, unspecified: Secondary | ICD-10-CM | POA: Diagnosis not present

## 2014-06-29 DIAGNOSIS — G9340 Encephalopathy, unspecified: Secondary | ICD-10-CM | POA: Diagnosis not present

## 2014-06-29 DIAGNOSIS — Z96651 Presence of right artificial knee joint: Secondary | ICD-10-CM | POA: Diagnosis not present

## 2014-06-29 LAB — BASIC METABOLIC PANEL
Anion gap: 10 (ref 5–15)
BUN: 22 mg/dL (ref 6–23)
CALCIUM: 8.6 mg/dL (ref 8.4–10.5)
CHLORIDE: 106 mmol/L (ref 96–112)
CO2: 24 mmol/L (ref 19–32)
Creatinine, Ser: 0.96 mg/dL (ref 0.50–1.35)
GFR calc Af Amer: >90 mL/min (ref >90)
GFR calc non Af Amer: 81 mL/min — ABNORMAL LOW (ref >90)
GLUCOSE: 147 mg/dL — AB (ref 70–99)
Potassium: 4 mmol/L (ref 3.5–5.1)
Sodium: 140 mmol/L (ref 135–145)

## 2014-06-29 LAB — CBC
HEMATOCRIT: 29.4 % — AB (ref 39.0–52.0)
Hemoglobin: 9.3 g/dL — ABNORMAL LOW (ref 13.0–17.0)
MCH: 29.9 pg (ref 26.0–34.0)
MCHC: 31.6 g/dL (ref 30.0–36.0)
MCV: 94.5 fL (ref 78.0–100.0)
PLATELETS: 217 10*3/uL (ref 150–400)
RBC: 3.11 MIL/uL — AB (ref 4.22–5.81)
RDW: 14 % (ref 11.5–15.5)
WBC: 6.3 10*3/uL (ref 4.0–10.5)

## 2014-06-29 LAB — HEMOGLOBIN A1C
Hgb A1c MFr Bld: 5.9 % — ABNORMAL HIGH (ref 4.8–5.6)
Mean Plasma Glucose: 123 mg/dL

## 2014-06-29 MED ORDER — NAPROXEN 250 MG PO TABS
250.0000 mg | ORAL_TABLET | Freq: Two times a day (BID) | ORAL | Status: DC | PRN
  Filled 2014-06-29: qty 1

## 2014-06-29 MED ORDER — LISINOPRIL 10 MG PO TABS: 10.0000 mg | ORAL_TABLET | Freq: Every morning | ORAL | Status: DC

## 2014-06-29 MED ORDER — NAPROXEN 250 MG PO TABS: 250.0000 mg | ORAL_TABLET | Freq: Two times a day (BID) | ORAL | Status: DC | PRN

## 2014-06-29 NOTE — Progress Notes (Signed)
[facility] to [facility] - submitted today, anticipate authorization tomorrow.     Lincoln MaxinKelly Lonya Johannesen, LCSW [facility] Clinical Social Worker cell #: [phone]

## 2014-06-29 NOTE — Discharge Summary (Addendum)
Physician Discharge Summary  [name] [name] QDI:[id number] DOB: 1940-06-08 DOA: 06/27/2014  PCP: Suzan Garibaldi, FNP  Admit date: 06/27/2014 Discharge date: 06/30/2014  Time spent: 55 minutes  ADDENDUM: the patient was not able to be discharged yesterday due to insurance issues. He is stable for discharge today.   Recommendations for Outpatient Follow-up:  1. Will return to [facility] 2. B met in 4-5 days 3. Follow BP- Resume lisinopril as needed- BP 121/51 today  Discharge Condition: stable Diet recommendation: low sodium, heart healthy  Discharge Diagnoses:  Principal Problem:   Encephalopathy- acute Active Problems:   HYPERTENSION, CONTROLLED   History of total knee arthroplasty   ARF (acute renal failure)   History of present illness:  This is a 74 year old male with a PMH of HTN, Hyperlipidemia, CAD, essential tremor who underwent a right total knee replacement on 4/19 and was discharged to a nursing facility on 4/21. He was sent from the nursing facility on 4/23 to the hospital secondary to new onset confusion. It was also noted that he had acute renal failure. According to the H&P records also mention that he was having episodes of hypotension while at [facility].  Hospital Course:  Acute encephalopathy -Likely secondary to Oxycodone or Robaxin versus secondary to acute renal failure -I have discontinued his Oxycodone and Robaxin -Mental status is back to normal today  -He is not in pain - I have ordered Naprosyn as needed for pain-he states he has taken hydrocodone in the past without any adverse effects-if pain is uncontrolled on Naprosyn, hydrocodone can be added at the nursing facility.  ARF (acute renal failure) -Prerenal versus ATN secondary to possible hypotension as mentioned in H&P - Creatinine 2.15 on admission improved to 0.96 this morning -We have been hydrating him with IV fluids to treat the prerenal component -He is voiding well   HYPERTENSION,  CONTROLLED - Lisinopril held due to acute renal failure- it can be resumed tomorrow morning if BP is stable -Continue propranolol which she actually takes for his tremors- his blood pressure readings are in the 407W to 808U systolic today   History of total knee arthroplasty -Continue Xarelto for DVT prophylaxis  Essential Tremor - placed on Librium and Propanolol for this about 20 yrs ago    Discharge Exam: Filed Weights   06/28/14 0245  Weight: 102.649 kg (226 lb 4.8 oz)   Filed Vitals:   06/30/14 0530  BP: 121/51  Pulse: 88  Temp: 98.3 F (36.8 C)  Resp: 20    General: AAO x 3, no distress Cardiovascular: RRR, no murmurs  Respiratory: clear to auscultation bilaterally GI: soft, non-tender, non-distended, bowel sound positive MSK: Brace on right knee, no edema of lower extremities  Discharge Instructions You were cared for by a hospitalist during your hospital stay. If you have any questions about your discharge medications or the care you received while you were in the hospital after you are discharged, you can call the unit and asked to speak with the hospitalist on call if the hospitalist that took care of you is not available. Once you are discharged, your primary care physician will handle any further medical issues. Please note that NO REFILLS for any discharge medications will be authorized once you are discharged, as it is imperative that you return to your primary care physician (or establish a relationship with a primary care physician if you do not have one) for your aftercare needs so that they can reassess your need for medications and monitor  your lab values.      Discharge Instructions    Diet - low sodium heart healthy    Complete by:  As directed      Increase activity slowly    Complete by:  As directed             Medication List    STOP taking these medications        lisinopril 10 MG tablet  Commonly known as:  PRINIVIL,ZESTRIL      methocarbamol 500 MG tablet  Commonly known as:  ROBAXIN     oxyCODONE 5 MG immediate release tablet  Commonly known as:  Oxy IR/ROXICODONE      TAKE these medications        chlordiazePOXIDE 25 MG capsule  Commonly known as:  LIBRIUM  Take one capsule by mouth once daily     docusate sodium 100 MG capsule  Commonly known as:  COLACE  Take 1 capsule (100 mg total) by mouth 2 (two) times daily.     esomeprazole 40 MG capsule  Commonly known as:  NEXIUM  Take 40 mg by mouth daily as needed (indegistion.).     naproxen 250 MG tablet  Commonly known as:  NAPROSYN  Take 1 tablet (250 mg total) by mouth 2 (two) times daily as needed for moderate pain.     propranolol ER 60 MG 24 hr capsule  Commonly known as:  INDERAL LA  Take 60 mg by mouth every morning.     rivaroxaban 10 MG Tabs tablet  Commonly known as:  XARELTO  Take 1 tablet (10 mg total) by mouth daily with breakfast.     simvastatin 40 MG tablet  Commonly known as:  ZOCOR  Take 40 mg by mouth every morning.     travoprost (benzalkonium) 0.004 % ophthalmic solution  Commonly known as:  TRAVATAN  Place 1 drop into both eyes daily as needed (eye irritation).       No Known Allergies    The results of significant diagnostics from this hospitalization (including imaging, microbiology, ancillary and laboratory) are listed below for reference.    Significant Diagnostic Studies: Dg Chest 2 View  06/27/2014   CLINICAL DATA:  Altered mental status. Possible pain medication related.  EXAM: CHEST  2 VIEW  COMPARISON:  Chest radiograph 11/27/2013.  FINDINGS: Mild cardiac enlargement. Low lung volumes. No active infiltrates or failure. Atherosclerosis of the aorta. Negative osseous structures. Advanced degenerative change thoracic spine. Stable exam from priors.  IMPRESSION: Cardiomegaly, low lung volumes. No definite active infiltrates or failure.   Electronically Signed   By: Rolla Flatten M.D.   On: 06/27/2014 22:05   Ct  Head Wo Contrast  06/27/2014   CLINICAL DATA:  Altered mental status.  Recent knee surgery.  EXAM: CT HEAD WITHOUT CONTRAST  TECHNIQUE: Contiguous axial images were obtained from the base of the skull through the vertex without intravenous contrast.  COMPARISON:  None.  FINDINGS: No evidence for acute infarction, hemorrhage, mass lesion, hydrocephalus, or extra-axial fluid. Normal for age cerebral volume. Slight hypoattenuation of white matter, likely chronic microvascular ischemic change. Calvarium intact. Mild vascular calcification. No sinus or mastoid air fluid level.  IMPRESSION: Negative exam.   Electronically Signed   By: Rolla Flatten M.D.   On: 06/27/2014 21:40    Microbiology: Recent Results (from the past 240 hour(s))  Urine culture     Status: None (Preliminary result)   Collection Time: 06/27/14  9:21 PM  Result  Value Ref Range Status   Specimen Description URINE, CATHETERIZED  Final   Special Requests NONE  Final   Colony Count   Final    15,000 COLONIES/ML Performed at [facility]    Culture   Final    ENTEROCOCCUS SPECIES Performed at [facility]    Report Status PENDING  Incomplete  MRSA PCR Screening     Status: None   Collection Time: 06/28/14  3:15 AM  Result Value Ref Range Status   MRSA by PCR NEGATIVE NEGATIVE Final    Comment:        The GeneXpert MRSA Assay (FDA approved for NASAL specimens only), is one component of a comprehensive MRSA colonization surveillance program. It is not intended to diagnose MRSA infection nor to guide or monitor treatment for MRSA infections.      Labs: Basic Metabolic Panel:  Recent Labs Lab 06/24/14 0525 06/25/14 0419 06/27/14 2110 06/28/14 0548 06/29/14 0440  NA 138 133* 135 140 140  K 4.8 4.3 4.0 4.8 4.0  CL 102 97 98 104 106  CO2 30 27 28 28 24   GLUCOSE 154* 146* 140* 144* 147*  BUN 13 19 55* 42* 22  CREATININE 1.02 1.49* 2.15* 1.56* 0.96  CALCIUM 8.5 8.2* 8.4 8.8 8.6  MG  --   --   --   2.5  --   PHOS  --   --   --  2.8  --    Liver Function Tests:  Recent Labs Lab 06/27/14 2110 06/28/14 0548  AST 38* 45*  ALT 17 22  ALKPHOS 75 78  BILITOT 0.8 1.2  PROT 6.8 7.0  ALBUMIN 3.2* 3.0*   No results for input(s): LIPASE, AMYLASE in the last 168 hours. No results for input(s): AMMONIA in the last 168 hours. CBC:  Recent Labs Lab 06/24/14 0525 06/25/14 0419 06/27/14 2110 06/28/14 0548 06/29/14 0440  WBC 10.9* 14.7* 7.6 6.7 6.3  NEUTROABS  --   --  4.0  --   --   HGB 13.1 11.8* 10.0* 10.0* 9.3*  HCT 41.1 37.1* 30.4* 31.4* 29.4*  MCV 94.7 93.9 93.3 95.2 94.5  PLT 175 179 168 211 217   Cardiac Enzymes:  Recent Labs Lab 06/28/14 0446  TROPONINI <0.03   BNP: BNP (last 3 results) No results for input(s): BNP in the last 8760 hours.  ProBNP (last 3 results) No results for input(s): PROBNP in the last 8760 hours.  CBG: No results for input(s): GLUCAP in the last 168 hours.     SignedDebbe Odea, MD [facility] 06/30/2014, 9:35 AM

## 2014-06-29 NOTE — Evaluation (Signed)
Physical Therapy Evaluation Patient Details Name: [name] [name] MRN: [id number] DOB: 1941/02/26 Today's Date: 06/29/2014   History of Present Illness  Pt is a 74 year old male admitted for new onset confusion and acute renal failure with recent R TKR  06/23/14 and hx of L TKR, MI, back surgery and CAD  Clinical Impression  Pt admitted with above diagnosis. Pt currently with functional limitations due to the deficits listed below (see PT Problem List).   Pt will benefit from skilled PT to increase their independence and safety with mobility to allow discharge to the venue listed below.   Pt ambulated to/from bathroom and performed a couple exercises for R knee ROM.  Pt hopeful to d/c back to [facility] soon and resume rehab.      Follow Up Recommendations SNF    Equipment Recommendations  None recommended by PT    Recommendations for Other Services       Precautions / Restrictions Precautions Precautions: Knee;Fall Precaution Comments: has KI in hospital however able to perform SLR today Required Braces or Orthoses: Knee Immobilizer - Right Restrictions Other Position/Activity Restrictions: WBAT      Mobility  Bed Mobility Overal bed mobility: Needs Assistance Bed Mobility: Supine to Sit;Sit to Supine     Supine to sit: Supervision Sit to supine: Supervision   General bed mobility comments: verbal cues for self assist  Transfers Overall transfer level: Needs assistance Equipment used: Rolling walker (2 wheeled) Transfers: Sit to/from Stand Sit to Stand: Min assist         General transfer comment: verbal cues for safe technique, min/guard from higher surface however required min assist from toilet  Ambulation/Gait Ambulation/Gait assistance: Min guard Ambulation Distance (Feet): 20 Feet Assistive device: Rolling walker (2 wheeled) Gait Pattern/deviations: Step-to pattern;Antalgic;Trunk flexed     General Gait Details: verbal cues for posture and safety with  RW, distance limited to/from bathroom as pt concerned with R knee pain and not receiving any pain meds  Stairs            Wheelchair Mobility    Modified Rankin (Stroke Patients Only)       Balance                                             Pertinent Vitals/Pain Pain Assessment: 0-10 Pain Score: 5  Pain Location: R knee Pain Descriptors / Indicators: Sore;Aching Pain Intervention(s): Limited activity within patient's tolerance;Monitored during session;Repositioned;Ice applied    Home Living Family/patient expects to be discharged to:: Skilled nursing facility Living Arrangements: Alone                    Prior Function           Comments: independent prior to R TKA, admitted from SNF for rehab of R knee     Hand Dominance        Extremity/Trunk Assessment   Upper Extremity Assessment: Overall WFL for tasks assessed           Lower Extremity Assessment: RLE deficits/detail;LLE deficits/detail RLE Deficits / Details: good quad contraction, able to perform SLR, knee flexion AAROM approx 85* LLE Deficits / Details: Strength WFL, AROM knee flexion <90*     Communication   Communication: No difficulties  Cognition Arousal/Alertness: Awake/alert Behavior During Therapy: WFL for tasks assessed/performed Overall Cognitive Status: Within Functional Limits for tasks  assessed                      General Comments      Exercises Total Joint Exercises Quad Sets: AROM;Both;Supine;15 reps Heel Slides: AAROM;Right;Supine;AROM;15 reps      Assessment/Plan    PT Assessment Patient needs continued PT services  PT Diagnosis Difficulty walking   PT Problem List Decreased strength;Decreased range of motion;Decreased activity tolerance;Decreased mobility;Decreased knowledge of use of DME;Pain  PT Treatment Interventions DME instruction;Gait training;Functional mobility training;Therapeutic activities;Therapeutic  exercise;Patient/family education   PT Goals (Current goals can be found in the Care Plan section) Acute Rehab PT Goals PT Goal Formulation: With patient Time For Goal Achievement: 07/06/14 Potential to Achieve Goals: Good    Frequency Min 3X/week   Barriers to discharge        Co-evaluation               End of Session Equipment Utilized During Treatment: Right knee immobilizer Activity Tolerance: Patient tolerated treatment well Patient left: in bed;with call bell/phone within reach;with nursing/sitter in room           Time: [id number] PT Time Calculation (min) (ACUTE ONLY): 31 min   Charges:   PT Evaluation $Initial PT Evaluation Tier I: 1 Procedure     PT G Codes:        [name] [name],[name] [name] 06/29/2014, 11:03 AM [name] [name], PT, DPT 06/29/2014 Pager: [phone]

## 2014-06-29 NOTE — Progress Notes (Signed)
Pt refused 0200 VS.

## 2014-06-30 ENCOUNTER — Other Ambulatory Visit: Payer: Self-pay | Admitting: *Deleted

## 2014-06-30 ENCOUNTER — Non-Acute Institutional Stay (SKILLED_NURSING_FACILITY): Payer: Medicare PPO | Admitting: Adult Health

## 2014-06-30 ENCOUNTER — Encounter: Payer: Self-pay | Admitting: Adult Health

## 2014-06-30 DIAGNOSIS — K219 Gastro-esophageal reflux disease without esophagitis: Secondary | ICD-10-CM

## 2014-06-30 DIAGNOSIS — G9340 Encephalopathy, unspecified: Secondary | ICD-10-CM

## 2014-06-30 DIAGNOSIS — K5900 Constipation, unspecified: Secondary | ICD-10-CM

## 2014-06-30 DIAGNOSIS — I10 Essential (primary) hypertension: Secondary | ICD-10-CM | POA: Diagnosis not present

## 2014-06-30 DIAGNOSIS — M1711 Unilateral primary osteoarthritis, right knee: Secondary | ICD-10-CM | POA: Diagnosis not present

## 2014-06-30 DIAGNOSIS — R251 Tremor, unspecified: Secondary | ICD-10-CM | POA: Diagnosis not present

## 2014-06-30 DIAGNOSIS — E785 Hyperlipidemia, unspecified: Secondary | ICD-10-CM

## 2014-06-30 MED ORDER — CHLORDIAZEPOXIDE HCL 25 MG PO CAPS: ORAL_CAPSULE | ORAL | Status: DC

## 2014-06-30 NOTE — Progress Notes (Signed)
Patient ID: [name] PlumberDavid Frazee, male   DOB: Feb 06, 1941, 74 y.o.   MRN: [id number]    06/30/2014  Facility:  Nursing Home Location:  [facility] Room Number: 602-P LEVEL OF CARE:  SNF (31)   Chief Complaint  Patient presents with  . Hospitalization Follow-up    Acute encephalopathy, osteoarthritis S/P right total knee arthroplasty, hypertension, essential tremor, constipation, hyperlipidemia and GERD    HISTORY OF PRESENT ILLNESS:  This is a 74 year old male who was been read[facility] today, 06/30/14, [facility]. He has PMH of hypertension, hyperlipidemia, CAD and essential tremors. He had a recent total knee arthroplasty on 4/19 and was [facility] 4/21 for short-term rehabilitation. However, he was noted to have confusion so he was transferred back to the hospital on 4/23. He was noted to have acute renal failure then (creatinine 2.15). Robaxin and oxycodone were discontinued. He was given IV fluids.  He is seen today in his room with family at bedside. He is alert and oriented. He is concerned that he won't be able to perform well in rehabilitation if his pain is not well controlled.  He has been admitted for a short-term rehabilitation.  PAST MEDICAL HISTORY:  Past Medical History  Diagnosis Date  . Hyperlipidemia     with low HDL, improved on simvastatin.  Marland Kitchen. Hypertension   . Tremors of nervous system   . Myocardial infarction 2008  . GERD (gastroesophageal reflux disease)     rare, mild  . Glaucoma   . Arthritis     knees  . Coronary artery disease     status post non-ST-elevation myocardial   infarction in May 2008 with treatment of a diagonal branch lesion  with a Taxus drug-eluting stent.    CURRENT MEDICATIONS: Reviewed per MAR/see medication list  No Known Allergies   REVIEW OF SYSTEMS:  GENERAL: no change in appetite, no fatigue, no weight changes, no fever, chills or weakness RESPIRATORY: no  cough, SOB, DOE, wheezing, hemoptysis CARDIAC: no chest pain, edema or palpitations GI: no abdominal pain, diarrhea, constipation, heart burn, nausea or vomiting  PHYSICAL EXAMINATION  GENERAL: no acute distress, normal body habitus SKIN:  Right knee surgical incision is dry, no redness EYES: conjunctivae normal, sclerae normal, normal eye lids NECK: supple, trachea midline, no neck masses, no thyroid tenderness, no thyromegaly LYMPHATICS: no LAN in the neck, no supraclavicular LAN RESPIRATORY: breathing is even & unlabored, BS CTAB CARDIAC: RRR, no murmur,no extra heart sounds, no edema GI: abdomen soft, normal BS, no masses, no tenderness, no hepatomegaly, no splenomegaly EXTREMITIES: Able to move 4 extremities PSYCHIATRIC: the patient is alert & oriented to person, affect & behavior appropriate  LABS/RADIOLOGY: Labs reviewed: Basic Metabolic Panel:  Recent Labs  65/78/4604/23/16 2110 06/28/14 0548 06/29/14 0440  NA 135 140 140  K 4.0 4.8 4.0  CL 98 104 106  CO2 28 28 24   GLUCOSE 140* 144* 147*  BUN 55* 42* 22  CREATININE 2.15* 1.56* 0.96  CALCIUM 8.4 8.8 8.6  MG  --  2.5  --   PHOS  --  2.8  --    Liver Function Tests:  Recent Labs  06/18/14 0955 06/27/14 2110 06/28/14 0548  AST 26 38* 45*  ALT 15 17 22   ALKPHOS 102 75 78  BILITOT 0.9 0.8 1.2  PROT 7.1 6.8 7.0  ALBUMIN 4.4 3.2* 3.0*   CBC:  Recent Labs  06/18/14 0955  06/27/14 2110  06/28/14 0548 06/29/14 0440  WBC 6.6  < > 7.6 6.7 6.3  NEUTROABS 3.2  --  4.0  --   --   HGB 14.4  < > 10.0* 10.0* 9.3*  HCT 44.9  < > 30.4* 31.4* 29.4*  MCV 93.5  < > 93.3 95.2 94.5  PLT 190  < > 168 211 217  < > = values in this interval not displayed.  Cardiac Enzymes:  Recent Labs  06/28/14 0446  TROPONINI <0.03    Dg Chest 2 View  06/27/2014   CLINICAL DATA:  Altered mental status. Possible pain medication related.  EXAM: CHEST  2 VIEW  COMPARISON:  Chest radiograph 11/27/2013.  FINDINGS: Mild cardiac  enlargement. Low lung volumes. No active infiltrates or failure. Atherosclerosis of the aorta. Negative osseous structures. Advanced degenerative change thoracic spine. Stable exam from priors.  IMPRESSION: Cardiomegaly, low lung volumes. No definite active infiltrates or failure.   Electronically Signed   By: Davonna Belling M.D.   On: 06/27/2014 22:05   Ct Head Wo Contrast  06/27/2014   CLINICAL DATA:  Altered mental status.  Recent knee surgery.  EXAM: CT HEAD WITHOUT CONTRAST  TECHNIQUE: Contiguous axial images were obtained from the base of the skull through the vertex without intravenous contrast.  COMPARISON:  None.  FINDINGS: No evidence for acute infarction, hemorrhage, mass lesion, hydrocephalus, or extra-axial fluid. Normal for age cerebral volume. Slight hypoattenuation of white matter, likely chronic microvascular ischemic change. Calvarium intact. Mild vascular calcification. No sinus or mastoid air fluid level.  IMPRESSION: Negative exam.   Electronically Signed   By: Davonna Belling M.D.   On: 06/27/2014 21:40    ASSESSMENT/PLAN:  Encephalopathy - Robaxin and oxycodone were discontinued; he is currently alert and oriented  Osteoarthritis S/P right total knee arthroplasty - for rehabilitation; continue Xarelto 10 mg by mouth daily for DVT prophylaxis and start Norco 5/325 mg 1 tab by mouth every 4 hours when necessary for pain Acute renal failure - creatinine 0.96; for BMP in 4 days Hypertension - this in the pill 10 mg on hold due to ARF; continue Inderal LA 60 mg by mouth every morning; BP/heart rate every shift 1 week Essential tremor - continue Librium 25 mg 1 capsule by mouth daily and Inderal LA 60 mg by mouth every morning Constipation - continue Colace 100 mg by mouth twice a day Hyperlipidemia - continue Zocor 40 mg by mouth every morning GERD - continue Nexium 40 mg by mouth daily when necessary   Goals of care:  Short-term rehabilitation   Labs/test ordered:  BMP in 4  days  Spent 50 minutes in patient care.    [facility], NP [employer] [phone]

## 2014-06-30 NOTE — Progress Notes (Signed)
Patient is set to [facility] today. Patient & sister, Coralee Northina aware. Discharge packet given to RN, Amil AmenJulia. Patient's sister, Coralee Northina to transport to [place] around 11:30am - RN aware.     Lincoln MaxinKelly Mete Purdum, LCSW [facility] Clinical Social Worker cell #: [phone]

## 2014-06-30 NOTE — Progress Notes (Signed)
Patient not discharged yesterday due to insurance issues. He is stable for discharge today.

## 2014-06-30 NOTE — Telephone Encounter (Signed)
[facility] 

## 2014-07-01 LAB — URINE CULTURE

## 2014-07-01 NOTE — Progress Notes (Signed)
Physical Therapy G-Codes for Evaluation    06/29/14 1102  PT Time Calculation  PT Start Time (ACUTE ONLY) 1013  PT Stop Time (ACUTE ONLY) 1044  PT Time Calculation (min) (ACUTE ONLY) 31 min  PT G-Codes **NOT FOR INPATIENT CLASS**  Functional Assessment Tool Used clinical judgement  Functional Limitation Mobility: Walking and moving around  Mobility: Walking and Moving Around Current Status (Z6109(G8978) CJ  Mobility: Walking and Moving Around Goal Status (U0454(G8979) CI  PT General Charges  $$ ACUTE PT VISIT 1 Procedure  PT Evaluation  $Initial PT Evaluation Tier I 1 Procedure   Zenovia JarredKati Naarah Borgerding, PT, DPT 07/01/2014 Pager: [phone]

## 2014-07-03 ENCOUNTER — Non-Acute Institutional Stay: Payer: Medicare PPO | Admitting: Internal Medicine

## 2014-07-03 DIAGNOSIS — R251 Tremor, unspecified: Secondary | ICD-10-CM

## 2014-07-03 DIAGNOSIS — K219 Gastro-esophageal reflux disease without esophagitis: Secondary | ICD-10-CM

## 2014-07-03 DIAGNOSIS — G9340 Encephalopathy, unspecified: Secondary | ICD-10-CM

## 2014-07-03 DIAGNOSIS — D62 Acute posthemorrhagic anemia: Secondary | ICD-10-CM

## 2014-07-03 DIAGNOSIS — M1711 Unilateral primary osteoarthritis, right knee: Secondary | ICD-10-CM

## 2014-07-03 DIAGNOSIS — I10 Essential (primary) hypertension: Secondary | ICD-10-CM | POA: Diagnosis not present

## 2014-07-03 DIAGNOSIS — N179 Acute kidney failure, unspecified: Secondary | ICD-10-CM

## 2014-07-03 DIAGNOSIS — R5381 Other malaise: Secondary | ICD-10-CM | POA: Diagnosis not present

## 2014-07-03 DIAGNOSIS — K5901 Slow transit constipation: Secondary | ICD-10-CM | POA: Diagnosis not present

## 2014-07-03 NOTE — Progress Notes (Signed)
Patient ID: [name] [name], male   DOB: [birth year], 74 y.o.   MRN: [id number]     [facility]   PCP: Marin Comment, FNP  Code Status: full code  No Known Allergies  Chief Complaint  Patient presents with  . New Admit To SNF     HPI:  74 year old patient is here for short term rehabilitation post hospital admission from 06/27/14-06/30/14 with acute encephalopathy. He was in the hospital prior to this with right knee OA and had undergone right total knee arthroplasty. He had come to SNF for STR and had to be readmitted to the hospital with acute encephalopathy. His oxycodone and robaxin were held and he was given iv fluids for ARF. He made clinical improvement and now is here for rehabilitation. He is seen in his room today. His pain is under control with current regimen of pain medication. He denies any concerns. He has PMH of CAD, HTN, HLD, OA among others.  Review of Systems:  Constitutional: Negative for fever, chills, diaphoresis.  HENT: Negative for headache, congestion, nasal discharge Eyes: Negative for eye pain, blurred vision, double vision and discharge.  Respiratory: Negative for cough, shortness of breath and wheezing.   Cardiovascular: Negative for chest pain, palpitations, leg swelling.  Gastrointestinal: Negative for heartburn, nausea, vomiting, abdominal pain and constipation. appetite is good. Genitourinary: Negative for dysuria Musculoskeletal: Negative for back pain, falls Skin: Negative for itching, rash.  Neurological: Negative for dizziness, tingling, focal weakness Psychiatric/Behavioral: Negative for depression   Past Medical History  Diagnosis Date  . Hyperlipidemia     with low HDL, improved on simvastatin.  Marland Kitchen Hypertension   . Tremors of nervous system   . Myocardial infarction 2008  . GERD (gastroesophageal reflux disease)     rare, mild  . Glaucoma   . Arthritis     knees  . Coronary artery disease     status post  non-ST-elevation myocardial   infarction in May 2008 with treatment of a diagonal branch lesion  with a Taxus drug-eluting stent.   Past Surgical History  Procedure Laterality Date  . Knee surgery Bilateral   . Shoulder surgery Bilateral     rotator cuff repair  . Hand surgery Right     replacement of 2 knuckles  . Elbow surgery Right     tendon surgery  . Cardiac catheterization  2008  . Coronary angioplasty  2008  . Eye surgery Bilateral last 3 years ago     laser surgery x2 each- relieves pressure  . Back surgery      lower  . Total knee arthroplasty Left 12/03/2013    Procedure: LEFT TOTAL KNEE ARTHROPLASTY;  Surgeon: Jacki Cones, MD;  Location: [facility];  Service: Orthopedics;  Laterality: Left;  . Coronary stents     . Total knee arthroplasty Right 06/23/2014    Procedure: RIGHT TOTAL KNEE ARTHROPLASTY;  Surgeon: Ranee Gosselin, MD;  Location: [facility];  Service: Orthopedics;  Laterality: Right;   Social History:   reports that he quit smoking about 33 years ago. His smoking use included Cigarettes. He has never used smokeless tobacco. He reports that he does not drink alcohol or use illicit drugs.  Family History  Problem Relation Age of Onset  . Dementia Mother   . Heart failure Father     Medications: Patient's Medications  New Prescriptions   No medications on file  Previous Medications   CHLORDIAZEPOXIDE (LIBRIUM) 25 MG CAPSULE  Take one capsule by mouth once daily for Tremor   DOCUSATE SODIUM (COLACE) 100 MG CAPSULE    Take 1 capsule (100 mg total) by mouth 2 (two) times daily.   ESOMEPRAZOLE (NEXIUM) 40 MG CAPSULE    Take 40 mg by mouth daily as needed (indegistion.).    NAPROXEN (NAPROSYN) 250 MG TABLET    Take 1 tablet (250 mg total) by mouth 2 (two) times daily as needed for moderate pain.   PROPRANOLOL ER (INDERAL LA) 60 MG 24 HR CAPSULE    Take 60 mg by mouth every morning.   RIVAROXABAN (XARELTO) 10 MG TABS TABLET    Take 1 tablet (10 mg total) by mouth  daily with breakfast.   SIMVASTATIN (ZOCOR) 40 MG TABLET    Take 40 mg by mouth every morning.   TRAVOPROST, BENZALKONIUM, (TRAVATAN) 0.004 % OPHTHALMIC SOLUTION    Place 1 drop into both eyes daily as needed (eye irritation).   Modified Medications   No medications on file  Discontinued Medications   No medications on file     Physical Exam:  Filed Vitals:   07/03/14 1415  BP: 118/78  Pulse: 88  Temp: 97.5 F (36.4 C)  Resp: 18  SpO2: 97%    General- elderly obese male, in no acute distress Head- normocephalic, atraumatic Throat- moist mucus membrane Neck- no cervical lymphadenopathy Cardiovascular- normal s1,s2, no murmurs, palpable dorsalis pedis and radial pulses, trace right leg edema Respiratory- bilateral clear to auscultation, no wheeze, no rhonchi, no crackles, no use of accessory muscles Abdomen- bowel sounds present, soft, non tender Musculoskeletal- able to move all 4 extremities, limited right knee normal range of motion  Neurological- no focal deficit Skin- warm and dry, right knee surgical incision healing well, dressing dry and clean, right leg drain site has some blood oozing out which has soaked his dressing. Mild right leg erythema noted Psychiatry- alert and oriented to person, place and time, appears anxious   Labs reviewed: Basic Metabolic Panel:  Recent Labs  52/84/13 2110 06/28/14 0548 06/29/14 0440  NA 135 140 140  K 4.0 4.8 4.0  CL 98 104 106  CO2 GLUCOSE 140* 144* 147*  BUN 55* 42* 22  CREATININE 2.15* 1.56* 0.96  CALCIUM 8.4 8.8 8.6  MG  --  2.5  --   PHOS  --  2.8  --    Liver Function Tests:  Recent Labs  06/18/14 0955 06/27/14 2110 06/28/14 0548  AST 26 38* 45*  ALT ALKPHOS 102 75 78  BILITOT 0.9 0.8 1.2  PROT 7.1 6.8 7.0  ALBUMIN 4.4 3.2* 3.0*   No results for input(s): LIPASE, AMYLASE in the last 8760 hours. No results for input(s): AMMONIA in the last 8760 hours. CBC:  Recent Labs   06/18/14 0955  06/27/14 2110 06/28/14 0548 06/29/14 0440  WBC 6.6  < > 7.6 6.7 6.3  NEUTROABS 3.2  --  4.0  --   --   HGB 14.4  < > 10.0* 10.0* 9.3*  HCT 44.9  < > 30.4* 31.4* 29.4*  MCV 93.5  < > 93.3 95.2 94.5  PLT 190  < > 168 211 217  < > = values in this interval not displayed. Cardiac Enzymes:  Recent Labs  06/28/14 0446  TROPONINI <0.03    Assessment/Plan  Physical deconditioning Will have him work with physical therapy and occupational therapy team to help with gait training and muscle strengthening exercises.fall  precautions. Skin care. Encourage to be out of bed.   Right knee OA S/p right TKA. Has f/u with orthopedics. Continue norco 5-325 1 tab q4h prn for pain and xarelto for dvt prophylaxis. Continue skin care  Acute Encephalopathy  Resolved. Monitor clinically, avoid medications with sedative/ hypnotic property where possible  Acute renal failure Monitor bmp. D/c naprosyn with impaired renal function  Acute blood loss anemia Post op, monitor h&h, d/c naprosyn, with bleeding at drain site, check coagulation profile  Constipation Continue colace 100 mg bid  Hypertension continue Inderal LA 60 mg daily, monitor bp  tremors continue Librium 25 mg daily  gerd Stable on nexium 40 mg daily prn   Goals of care: short term rehabilitation   Labs/tests ordered: cbc, cmp, coagulation profile  Family/ staff Communication: reviewed care plan with patient and nursing supervisor    Oneal GroutMAHIMA Kare Dado, MD  [facility] [phone] (Monday-Friday 8 am - 5 pm) [phone] (afterhours)

## 2014-07-07 ENCOUNTER — Non-Acute Institutional Stay: Payer: Medicare PPO | Admitting: Adult Health

## 2014-07-07 ENCOUNTER — Encounter: Payer: Self-pay | Admitting: Adult Health

## 2014-07-07 DIAGNOSIS — I10 Essential (primary) hypertension: Secondary | ICD-10-CM | POA: Diagnosis not present

## 2014-07-07 DIAGNOSIS — E785 Hyperlipidemia, unspecified: Secondary | ICD-10-CM

## 2014-07-07 DIAGNOSIS — K5901 Slow transit constipation: Secondary | ICD-10-CM

## 2014-07-07 DIAGNOSIS — M1711 Unilateral primary osteoarthritis, right knee: Secondary | ICD-10-CM

## 2014-07-07 DIAGNOSIS — G9340 Encephalopathy, unspecified: Secondary | ICD-10-CM

## 2014-07-07 DIAGNOSIS — K219 Gastro-esophageal reflux disease without esophagitis: Secondary | ICD-10-CM

## 2014-07-07 DIAGNOSIS — R251 Tremor, unspecified: Secondary | ICD-10-CM | POA: Diagnosis not present

## 2014-07-07 NOTE — Progress Notes (Signed)
Patient ID: [name] [name], male   DOB: [birth year], 74 y.o.   MRN: [id number]    07/07/2014  Facility:  Nursing Home Location:  [facility] Room Number: 602-P LEVEL OF CARE:  SNF (31)   Chief Complaint  Patient presents with  . Discharge Note    Osteoarthritis S/P right total knee arthroplasty, hypertension, essential tremor, constipation, hyperlipidemia and GERD    HISTORY OF PRESENT ILLNESS:  This is a 74 year old male who is for discharge home with Home health PT for endurance. He has been readmitted to [facility] on 06/30/14, from [facility]. He has PMH of hypertension, hyperlipidemia, CAD and essential tremors. He had a recent total knee arthroplasty on 4/19 and was admitted to [facility] on 4/21 for short-term rehabilitation. However, he was noted to have confusion so he was transferred back to the hospital on 4/23. He was noted to have acute renal failure then (creatinine 2.15). Robaxin and oxycodone were discontinued. He was given IV fluids.  He is alert and oriented. He said that he is ready to go home.  Patient was admitted to this facility for short-term rehabilitation after the patient's recent hospitalization.  Patient has completed SNF rehabilitation and therapy has cleared the patient for discharge.   PAST MEDICAL HISTORY:  Past Medical History  Diagnosis Date  . Hyperlipidemia     with low HDL, improved on simvastatin.  Marland Kitchen Hypertension   . Tremors of nervous system   . Myocardial infarction 2008  . GERD (gastroesophageal reflux disease)     rare, mild  . Glaucoma   . Arthritis     knees  . Coronary artery disease     status post non-ST-elevation myocardial   infarction in May 2008 with treatment of a diagonal branch lesion  with a Taxus drug-eluting stent.    CURRENT MEDICATIONS: Reviewed per MAR/see medication list  No Known Allergies   REVIEW OF SYSTEMS:  GENERAL: no change in appetite, no fatigue, no weight  changes, no fever, chills or weakness RESPIRATORY: no cough, SOB, DOE, wheezing, hemoptysis CARDIAC: no chest pain, edema or palpitations GI: no abdominal pain, diarrhea, constipation, heart burn, nausea or vomiting  PHYSICAL EXAMINATION  GENERAL: no acute distress, normal body habitus SKIN:  Right knee surgical incision is dry, no redness NECK: supple, trachea midline, no neck masses, no thyroid tenderness, no thyromegaly LYMPHATICS: no LAN in the neck, no supraclavicular LAN RESPIRATORY: breathing is even & unlabored, BS CTAB CARDIAC: RRR, no murmur,no extra heart sounds, no edema GI: abdomen soft, normal BS, no masses, no tenderness, no hepatomegaly, no splenomegaly EXTREMITIES: Able to move 4 extremities PSYCHIATRIC: the patient is alert & oriented to person, affect & behavior appropriate  LABS/RADIOLOGY: Labs reviewed: 07/03/14  sodium 140 potassium 4.5 glucose 108 BUN 12 creatinine 0.97 calcium 8.6 Basic Metabolic Panel:  Recent Labs  40/98/11 2110 06/28/14 0548 06/29/14 0440  NA 135 140 140  K 4.0 4.8 4.0  CL 98 104 106  CO2 GLUCOSE 140* 144* 147*  BUN 55* 42* 22  CREATININE 2.15* 1.56* 0.96  CALCIUM 8.4 8.8 8.6  MG  --  2.5  --   PHOS  --  2.8  --    Liver Function Tests:  Recent Labs  06/18/14 0955 06/27/14 2110 06/28/14 0548  AST 26 38* 45*  ALT ALKPHOS 102 75 78  BILITOT 0.9 0.8 1.2  PROT 7.1 6.8 7.0  ALBUMIN 4.4  3.2* 3.0*   CBC:  Recent Labs  06/18/14 0955  06/27/14 2110 06/28/14 0548 06/29/14 0440  WBC 6.6  < > 7.6 6.7 6.3  NEUTROABS 3.2  --  4.0  --   --   HGB 14.4  < > 10.0* 10.0* 9.3*  HCT 44.9  < > 30.4* 31.4* 29.4*  MCV 93.5  < > 93.3 95.2 94.5  PLT 190  < > 168 211 217  < > = values in this interval not displayed.  Cardiac Enzymes:  Recent Labs  06/28/14 0446  TROPONINI <0.03    Dg Chest 2 View  06/27/2014   CLINICAL DATA:  Altered mental status. Possible pain medication related.  EXAM: CHEST  2 VIEW   COMPARISON:  Chest radiograph 11/27/2013.  FINDINGS: Mild cardiac enlargement. Low lung volumes. No active infiltrates or failure. Atherosclerosis of the aorta. Negative osseous structures. Advanced degenerative change thoracic spine. Stable exam from priors.  IMPRESSION: Cardiomegaly, low lung volumes. No definite active infiltrates or failure.   Electronically Signed   By: Davonna BellingJohn  Curnes M.D.   On: 06/27/2014 22:05   Ct Head Wo Contrast  06/27/2014   CLINICAL DATA:  Altered mental status.  Recent knee surgery.  EXAM: CT HEAD WITHOUT CONTRAST  TECHNIQUE: Contiguous axial images were obtained from the base of the skull through the vertex without intravenous contrast.  COMPARISON:  None.  FINDINGS: No evidence for acute infarction, hemorrhage, mass lesion, hydrocephalus, or extra-axial fluid. Normal for age cerebral volume. Slight hypoattenuation of white matter, likely chronic microvascular ischemic change. Calvarium intact. Mild vascular calcification. No sinus or mastoid air fluid level.  IMPRESSION: Negative exam.   Electronically Signed   By: Davonna BellingJohn  Curnes M.D.   On: 06/27/2014 21:40    ASSESSMENT/PLAN:  Encephalopathy -  Resolved. Robaxin and oxycodone were discontinued; he is currently alert and oriented  Osteoarthritis S/P right total knee arthroplasty - for home health PT ; continue Xarelto 10 mg by mouth daily for DVT prophylaxis and start Norco 5/325 mg 1 tab by mouth every 4 hours when necessary for pain Acute renal failure - creatinine 0.96; resolved Hypertension -  continue Inderal LA 60 mg by mouth every morning Essential tremor - continue Librium 25 mg 1 capsule by mouth daily and Inderal LA 60 mg by mouth every morning Constipation - continue Colace 100 mg by mouth twice a day Hyperlipidemia - continue Zocor 40 mg by mouth every morning GERD - continue Nexium 40 mg by mouth daily when necessary    I have filled out patient's discharge paperwork and written prescriptions.  Patient will  receive home health PT.  Total discharge time: Less than 30 minutes  Discharge time involved coordination of the discharge process with Child psychotherapistsocial worker, nursing staff and therapy department. Medical justification for home health services verified.   [facility]VARGAS,Benzion Mesta, NP [facility] [phone]

## 2014-07-28 ENCOUNTER — Encounter: Payer: Self-pay | Admitting: Physical Therapy

## 2014-07-28 ENCOUNTER — Ambulatory Visit: Payer: Medicare PPO | Attending: Orthopedic Surgery | Admitting: Physical Therapy

## 2014-07-28 DIAGNOSIS — R29898 Other symptoms and signs involving the musculoskeletal system: Secondary | ICD-10-CM | POA: Insufficient documentation

## 2014-07-28 DIAGNOSIS — M25561 Pain in right knee: Secondary | ICD-10-CM | POA: Diagnosis present

## 2014-07-28 DIAGNOSIS — M25661 Stiffness of right knee, not elsewhere classified: Secondary | ICD-10-CM

## 2014-07-28 DIAGNOSIS — R262 Difficulty in walking, not elsewhere classified: Secondary | ICD-10-CM

## 2014-07-28 NOTE — Therapy (Signed)
[facility] [address] [place], [place], [address] Phone: [phone]   Fax:  [phone]  Physical Therapy Evaluation  Patient Details  Name: [name] PlumberDavid Stevens MRN: [id number] Date of Birth: 1940/06/15 Referring Provider:  Ranee GosselinGioffre, Ronald, MD  Encounter Date: 07/28/2014      PT End of Session - 07/28/14 1445    Visit Number 1   Date for PT Re-Evaluation 09/27/14   PT Start Time 1428   PT Stop Time 1505   PT Time Calculation (min) 37 min      Past Medical History  Diagnosis Date  . Hyperlipidemia     with low HDL, improved on simvastatin.  Marland Kitchen. Hypertension   . Tremors of nervous system   . Myocardial infarction 2008  . GERD (gastroesophageal reflux disease)     rare, mild  . Glaucoma   . Arthritis     knees  . Coronary artery disease     status post non-ST-elevation myocardial   infarction in May 2008 with treatment of a diagonal branch lesion  with a Taxus drug-eluting stent.    Past Surgical History  Procedure Laterality Date  . Knee surgery Bilateral   . Shoulder surgery Bilateral     rotator cuff repair  . Hand surgery Right     replacement of 2 knuckles  . Elbow surgery Right     tendon surgery  . Cardiac catheterization  2008  . Coronary angioplasty  2008  . Eye surgery Bilateral last 3 years ago     laser surgery x2 each- relieves pressure  . Back surgery      lower  . Total knee arthroplasty Left 12/03/2013    Procedure: LEFT TOTAL KNEE ARTHROPLASTY;  Surgeon: Jacki Conesonald A Gioffre, MD;  Location: [facility];  Service: Orthopedics;  Laterality: Left;  . Coronary stents     . Total knee arthroplasty Right 06/23/2014    Procedure: RIGHT TOTAL KNEE ARTHROPLASTY;  Surgeon: Ranee Gosselinonald Gioffre, MD;  Location: [facility];  Service: Orthopedics;  Laterality: Right;    There were no vitals filed for this visit.  Visit Diagnosis:  Right knee pain - Plan: PT plan of care cert/re-cert  Decreased ROM of right knee -  Plan: PT plan of care cert/re-cert  Difficulty walking - Plan: PT plan of care cert/re-cert      Subjective Assessment - 07/28/14 1431    Subjective Patient reports that he underwent a right TKR on June 23, 2014, was in rehab facility 5-6 days, then 2 weeks of Home PT.  Reports overall doing well, but some decrease pain and ROM   Limitations Lifting;Walking;Standing;House hold activities   Patient Stated Goals no pain and walk without difficulty   Currently in Pain? Yes   Pain Score 3    Pain Location Knee  c/o pain in the thigh where the tourniquest was   Pain Orientation Right   Pain Descriptors / Indicators Aching;Tightness;Tender   Pain Type Acute pain   Pain Onset More than a month ago   Pain Frequency Constant   Aggravating Factors  activity   Pain Relieving Factors rest and ice            [facility] PT Assessment - 07/28/14 0001    Assessment   Medical Diagnosis S/P right TKR   Onset Date/Surgical Date 06/23/14   Prior Therapy for the left TKR September 2015   Precautions   Precautions None   Restrictions   Weight Bearing Restrictions  No   Balance Screen   Has the patient fallen in the past 6 months No   Has the patient had a decrease in activity level because of a fear of falling?  No   Is the patient reluctant to leave their home because of a fear of falling?  No   Home Environment   Additional Comments does his own house and yardwork   Prior Function   Level of Independence Independent   Vocation Retired   [employer] Requirements some standing   ROM / Strength   AROM / PROM / Strength --  AROM of the right knee 20-77 degrees flexion, PROM 10-84 deg   Palpation   Patella mobility ballotable patella   Palpation comment swelling and redness around the right anterior knee, scab recently came off of the distal scar iwth some bleeding, mild warmth, scar is tight   Ambulation/Gait   Gait Comments antalgic right, stiff legged on the right, uses a SPC, slow                    [facility] Adult PT Treatment/Exercise - 07/28/14 0001    Knee/Hip Exercises: Aerobic   Stationary Bike 5 minutes partial revs   Isokinetic NuStep Level 5 x 5 minutes   Knee/Hip Exercises: Machines for Strengthening   Cybex Knee Extension 5# 2x15   Cybex Knee Flexion 25# 2x15   Cybex Leg Press no weight 2x10 reps working on deep flexion                PT Education - 07/28/14 1444    Education provided Yes   Education Details HEP for low load long duration stretches for flexion and extension   Person(s) Educated Patient   Methods Explanation;Demonstration;Handout   Comprehension Verbalized understanding;Returned demonstration;Tactile cues required          PT Short Term Goals - 07/28/14 1448    PT SHORT TERM GOAL #1   Title independent with HEP   Time 2   Period Weeks   Status New           PT Long Term Goals - 07/28/14 1449    PT LONG TERM GOAL #1   Title increase ROM of the right knee to 10-100 degrees flexion   Time 8   Period Weeks   Status New   PT LONG TERM GOAL #2   Title walk all distances without cane   Time 8   Period Weeks   Status New   PT LONG TERM GOAL #3   Title decrease pain 50%   Time 8   Period Weeks   Status New               Plan - 07/28/14 1446    Clinical Impression Statement Patient with a stiff right knee, he was seen here last fall for his left TKR and wanted to try mostly on his own due to co-pay, he wants to do the same now.  I cautioned him about the ROM   Pt will benefit from skilled therapeutic intervention in order to improve on the following deficits Abnormal gait;Decreased mobility;Difficulty walking;Decreased range of motion;Decreased strength;Increased edema;Pain   Rehab Potential Good   PT Frequency 1x / week   PT Duration 8 weeks   PT Treatment/Interventions Electrical Stimulation;Cryotherapy;Ultrasound;Gait training;Stair training;Therapeutic exercise;Balance training;Neuromuscular  re-education;Manual techniques   PT Next Visit Plan Patient wants to try mostly on her own, he is to try HEP that I gave him  Consulted and Agree with Plan of Care Patient          G-Codes - 08/06/2014 1503    Functional Assessment Tool Used PT judgement   Functional Limitation Mobility: Walking and moving around   Mobility: Walking and Moving Around Current Status [phone]) At least 20 percent but less than 40 percent impaired, limited or restricted   Mobility: Walking and Moving Around Goal Status [phone]) At least 20 percent but less than 40 percent impaired, limited or restricted       Problem List Patient Active Problem List   Diagnosis Date Noted  . Encephalopathy 06/27/2014  . ARF (acute renal failure) 06/27/2014  . History of total knee arthroplasty 06/23/2014  . Tremors of nervous system 12/08/2013  . Osteoarthritis of left knee 12/03/2013  . Total knee replacement status 12/03/2013  . CAD, NATIVE VESSEL 06/30/2008  . Hyperlipidemia 06/24/2008  . HYPERTENSION, CONTROLLED 06/24/2008    Jearld Lesch., PT 2014-08-06, 3:05 PM  [facility] [address] [place], [place], [address] Phone: [phone]   Fax:  [phone]

## 2014-08-18 ENCOUNTER — Other Ambulatory Visit: Payer: Self-pay

## 2014-08-18 MED ORDER — LISINOPRIL 10 MG PO TABS: 10.0000 mg | ORAL_TABLET | Freq: Every day | ORAL | Status: DC

## 2014-08-18 MED ORDER — ESOMEPRAZOLE MAGNESIUM 40 MG PO CPDR: 40.0000 mg | DELAYED_RELEASE_CAPSULE | Freq: Every day | ORAL | Status: DC | PRN

## 2014-08-18 MED ORDER — PROPRANOLOL HCL ER 60 MG PO CP24: 60.0000 mg | ORAL_CAPSULE | Freq: Every morning | ORAL | Status: DC

## 2014-08-18 MED ORDER — RIVAROXABAN 10 MG PO TABS: 10.0000 mg | ORAL_TABLET | Freq: Every day | ORAL | Status: DC

## 2014-08-18 MED ORDER — SIMVASTATIN 40 MG PO TABS: 40.0000 mg | ORAL_TABLET | Freq: Every morning | ORAL | Status: DC

## 2014-12-11 ENCOUNTER — Ambulatory Visit (INDEPENDENT_AMBULATORY_CARE_PROVIDER_SITE_OTHER): Payer: Medicare PPO | Admitting: Cardiovascular Disease

## 2014-12-11 ENCOUNTER — Encounter: Payer: Self-pay | Admitting: Cardiovascular Disease

## 2014-12-11 VITALS — BP 110/68 | HR 85 | Ht 68.0 in | Wt 221.8 lb

## 2014-12-11 DIAGNOSIS — I10 Essential (primary) hypertension: Secondary | ICD-10-CM | POA: Diagnosis not present

## 2014-12-11 DIAGNOSIS — I2510 Atherosclerotic heart disease of native coronary artery without angina pectoris: Secondary | ICD-10-CM

## 2014-12-11 DIAGNOSIS — E785 Hyperlipidemia, unspecified: Secondary | ICD-10-CM

## 2014-12-11 MED ORDER — PROPRANOLOL HCL ER 60 MG PO CP24: 60.0000 mg | ORAL_CAPSULE | Freq: Every morning | ORAL | Status: DC

## 2014-12-11 MED ORDER — LISINOPRIL 10 MG PO TABS: 10.0000 mg | ORAL_TABLET | Freq: Every day | ORAL | Status: DC

## 2014-12-11 MED ORDER — SIMVASTATIN 40 MG PO TABS: 40.0000 mg | ORAL_TABLET | Freq: Every morning | ORAL | Status: DC

## 2014-12-11 NOTE — Patient Instructions (Addendum)

## 2014-12-11 NOTE — Progress Notes (Signed)
Chief Complaint  Patient presents with  . Follow-up      History of Present Illness: 74 yo male with history of CAD, HTN, HLD here today for cardiac follow up. He has been followed in the past by Dr. Juanda Chance. In 2007 he had a non-ST elevation MI treated with a Taxus stent placed in the diagonal branch of the LAD. He had 70-80% narrowing of the right coronary and later had a negative Myoview scan. His RCA disease has been managed medically since then. He had his left knee replaced in September 2015 and right knee replaced in April 2016. Acute encephalopathy in rehab following knee replacement in April 2016 and admitted to [facility] and felt to be narcotic induced. Also acute renal failure which resolved.   He is here today for follow up. He has had no chest pain, SOB, palpitations, near syncope or syncope. He is active. He is feeling great. He is fishing often.   Primary Care Physician: Marin Comment  Orthopedics: Ranee Gosselin  Last Lipid Profile: followed by primary care  Past Medical History  Diagnosis Date  . Hyperlipidemia     with low HDL, improved on simvastatin.  Marland Kitchen Hypertension   . Tremors of nervous system   . Myocardial infarction (HCC) 2008  . GERD (gastroesophageal reflux disease)     rare, mild  . Glaucoma   . Arthritis     knees  . Coronary artery disease     status post non-ST-elevation myocardial   infarction in May 2008 with treatment of a diagonal branch lesion  with a Taxus drug-eluting stent.    Past Surgical History  Procedure Laterality Date  . Knee surgery Bilateral   . Shoulder surgery Bilateral     rotator cuff repair  . Hand surgery Right     replacement of 2 knuckles  . Elbow surgery Right     tendon surgery  . Cardiac catheterization  2008  . Coronary angioplasty  2008  . Eye surgery Bilateral last 3 years ago     laser surgery x2 each- relieves pressure  . Back surgery      lower  . Total knee arthroplasty Left 12/03/2013    Procedure: LEFT  TOTAL KNEE ARTHROPLASTY;  Surgeon: Jacki Cones, MD;  Location: [facility];  Service: Orthopedics;  Laterality: Left;  . Coronary stents     . Total knee arthroplasty Right 06/23/2014    Procedure: RIGHT TOTAL KNEE ARTHROPLASTY;  Surgeon: Ranee Gosselin, MD;  Location: [facility];  Service: Orthopedics;  Laterality: Right;    Current outpatient prescriptions:  .  aspirin EC 81 MG tablet, Take 81 mg by mouth daily., Disp: , Rfl:  .  chlordiazePOXIDE (LIBRIUM) 25 MG capsule, Take one capsule by mouth once daily for Tremor, Disp: 30 capsule, Rfl: 5 .  docusate sodium (COLACE) 100 MG capsule, Take 1 capsule (100 mg total) by mouth 2 (two) times daily., Disp: 10 capsule, Rfl: 0 .  esomeprazole (NEXIUM) 40 MG capsule, Take 1 capsule (40 mg total) by mouth daily as needed (indegistion.)., Disp: 30 capsule, Rfl: 6 .  lisinopril (PRINIVIL,ZESTRIL) 10 MG tablet, Take 1 tablet (10 mg total) by mouth daily., Disp: 30 tablet, Rfl: 11 .  naproxen (NAPROSYN) 250 MG tablet, Take 1 tablet (250 mg total) by mouth 2 (two) times daily as needed for moderate pain., Disp: , Rfl:  .  propranolol ER (INDERAL LA) 60 MG 24 hr capsule, Take 1 capsule (60 mg total) by mouth  every morning., Disp: 30 capsule, Rfl: 11 .  simvastatin (ZOCOR) 40 MG tablet, Take 1 tablet (40 mg total) by mouth every morning., Disp: 30 tablet, Rfl: 11 .  travoprost, benzalkonium, (TRAVATAN) 0.004 % ophthalmic solution, Place 1 drop into both eyes daily as needed (eye irritation). , Disp: , Rfl:  No current facility-administered medications for this visit.  Facility-Administered Medications Ordered in Other Visits:  .  dexamethasone (DECADRON) injection 10 mg, 10 mg, Intravenous, Once, [employer], PA-C .  tranexamic acid (CYKLOKAPRON) 2,000 mg in sodium chloride 0.9 % 50 mL Topical Application, 2,000 mg, Topical, Once, [employer], PA-C   No Known Allergies  Social History   Social History  . Marital Status: Divorced    Spouse Name:  N/A  . Number of Children: N/A  . Years of Education: N/A   Occupational History  . retired    Social History Main Topics  . Smoking status: Former Smoker    Types: Cigarettes    Quit date: 07/27/1980  . Smokeless tobacco: Never Used  . Alcohol Use: No  . Drug Use: No  . Sexual Activity: Not on file   Other Topics Concern  . Not on file   Social History Narrative    Family History  Problem Relation Age of Onset  . Dementia Mother   . Heart failure Father     Review of Systems:  As stated in the HPI and otherwise negative.   BP 110/68 mmHg  Pulse 85  Ht  (1.727 m)  Wt 221 lb 12.8 oz (100.608 kg)  BMI 33.73 kg/m2  SpO2 95%  Physical Examination: General: Well developed, well nourished, NAD HEENT: OP clear, mucus membranes moist SKIN: warm, dry. No rashes. Neuro: No focal deficits Musculoskeletal: Muscle strength 5/5 all ext Psychiatric: Mood and affect normal Neck: No JVD, no carotid bruits, no thyromegaly, no lymphadenopathy. Lungs:Clear bilaterally, no wheezes, rhonci, crackles Cardiovascular: Regular rate and rhythm. No murmurs, gallops or rubs. Abdomen:Soft. Bowel sounds present. Non-tender.  Extremities: No lower extremity edema. Pulses are 2 + in the bilateral DP/PT.  EKG:  EKG is not ordered today. The ekg ordered today demonstrates   Recent Labs: 06/28/2014: ALT 22; Magnesium 2.5; TSH 2.027 06/29/2014: BUN 22; Creatinine, Ser 0.96; Hemoglobin 9.3*; Platelets 217; Potassium 4.0; Sodium 140   Lipid Panel    Component Value Date/Time   CHOL 90 10/19/2006 0834   TRIG 123 10/19/2006 0834   HDL 27.3* 10/19/2006 0834   CHOLHDL 3.3 CALC 10/19/2006 0834   VLDL 25 10/19/2006 0834   LDLCALC 38 10/19/2006 0834     Wt Readings from Last 3 Encounters:  12/11/14 221 lb 12.8 oz (100.608 kg)  07/07/14 211 lb (95.709 kg)  06/30/14 222 lb 12.8 oz (101.061 kg)     Other studies Reviewed: Additional studies/ records that were reviewed today include:  . Review of the above records demonstrates:    Assessment and Plan:   1. CAD: Stable.  Will continue current meds. We discussed adding Plavix to his ASA therapy based on the DAPT trial and the fact that he has a first generation Taxus DES but he refused as he had excessive bruising and bleeding on Plavix in the past.   2. HTN: BP controlled. No changes today.   3. Hyperlipidemia: He is on a statin. Lipids followed in primary care.   Current medicines are reviewed at length with the patient today.  The patient does not have concerns regarding medicines.  The following changes  have been made:  no change  Labs/ tests ordered today include:  No orders of the defined types were placed in this encounter.    Disposition:   FU with me in 12 months  Signed, Verne Carrow, MD 12/11/2014 10:04 AM    [facility] [address] [place], [place], [place]  [address] Phone: [phone]; Fax: [phone]

## 2015-12-15 ENCOUNTER — Encounter: Payer: Self-pay | Admitting: Cardiovascular Disease

## 2015-12-16 ENCOUNTER — Ambulatory Visit (INDEPENDENT_AMBULATORY_CARE_PROVIDER_SITE_OTHER): Payer: Medicare PPO | Admitting: Cardiovascular Disease

## 2015-12-16 ENCOUNTER — Encounter: Payer: Self-pay | Admitting: Cardiovascular Disease

## 2015-12-16 ENCOUNTER — Encounter (INDEPENDENT_AMBULATORY_CARE_PROVIDER_SITE_OTHER): Payer: Self-pay

## 2015-12-16 VITALS — BP 124/66 | HR 58 | Ht 68.0 in | Wt 224.2 lb

## 2015-12-16 DIAGNOSIS — E7800 Pure hypercholesterolemia, unspecified: Secondary | ICD-10-CM | POA: Diagnosis not present

## 2015-12-16 DIAGNOSIS — I10 Essential (primary) hypertension: Secondary | ICD-10-CM

## 2015-12-16 DIAGNOSIS — I2510 Atherosclerotic heart disease of native coronary artery without angina pectoris: Secondary | ICD-10-CM

## 2015-12-16 LAB — COMPREHENSIVE METABOLIC PANEL
ALBUMIN: 4.4 g/dL (ref 3.6–5.1)
ALT: 61 U/L — ABNORMAL HIGH (ref 9–46)
AST: 65 U/L — ABNORMAL HIGH (ref 10–35)
Alkaline Phosphatase: 80 U/L (ref 40–115)
BUN: 14 mg/dL (ref 7–25)
CALCIUM: 9.8 mg/dL (ref 8.6–10.3)
CHLORIDE: 101 mmol/L (ref 98–110)
CO2: 26 mmol/L (ref 20–31)
CREATININE: 0.99 mg/dL (ref 0.70–1.18)
Glucose, Bld: 140 mg/dL — ABNORMAL HIGH (ref 65–99)
POTASSIUM: 4.9 mmol/L (ref 3.5–5.3)
Sodium: 137 mmol/L (ref 135–146)
TOTAL PROTEIN: 6.9 g/dL (ref 6.1–8.1)
Total Bilirubin: 0.9 mg/dL (ref 0.2–1.2)

## 2015-12-16 LAB — LIPID PANEL
CHOL/HDL RATIO: 3.4 ratio (ref <=5.0)
Cholesterol: 120 mg/dL — ABNORMAL LOW (ref 125–200)
HDL: 35 mg/dL — ABNORMAL LOW (ref >=40)
LDL Cholesterol: 36 mg/dL (ref <130)
TRIGLYCERIDES: 243 mg/dL — AB (ref <150)
VLDL: 49 mg/dL — AB (ref <30)

## 2015-12-16 MED ORDER — SIMVASTATIN 40 MG PO TABS: 40.0000 mg | ORAL_TABLET | Freq: Every morning | ORAL | 11 refills | Status: DC

## 2015-12-16 MED ORDER — CHLORDIAZEPOXIDE HCL 25 MG PO CAPS: ORAL_CAPSULE | ORAL | 5 refills | Status: DC

## 2015-12-16 MED ORDER — PROPRANOLOL HCL ER 60 MG PO CP24: 60.0000 mg | ORAL_CAPSULE | Freq: Every morning | ORAL | 11 refills | Status: DC

## 2015-12-16 MED ORDER — LISINOPRIL 10 MG PO TABS: 10.0000 mg | ORAL_TABLET | Freq: Every day | ORAL | 11 refills | Status: DC

## 2015-12-16 NOTE — Patient Instructions (Signed)
Medication Instructions:  Your physician recommends that you continue on your current medications as directed. Please refer to the Current Medication list given to you today.   Labwork: Lab work to be done today--CMET, Lipid profile  Testing/Procedures: none  Follow-Up: Your physician wants you to follow-up in: 12 months.  You will receive a reminder letter in the mail two months in advance. If you don't receive a letter, please call our office to schedule the follow-up appointment.   Any Other Special Instructions Will Be Listed Below (If Applicable).     If you need a refill on your cardiac medications before your next appointment, please call your pharmacy.   

## 2015-12-16 NOTE — Progress Notes (Signed)
Chief Complaint  Patient presents with  . Follow-up    History of Present Illness: 75 yo male with history of CAD, HTN, HLD here today for cardiac follow up. He has been followed in the past by Dr. Olevia Perches. In 2007 he had a non-ST elevation MI treated with a Taxus stent placed in the diagonal branch of the LAD. He had 70-80% narrowing of the right coronary and later had a negative Myoview scan. His RCA disease has been managed medically since then. He had his left knee replaced in September 2015 and right knee replaced in April 2016. Acute encephalopathy in rehab following knee replacement in April 2016 and admitted to [facility] and felt to be narcotic induced. Also acute renal failure which resolved.   He is here today for follow up. He has had no chest pain, SOB, palpitations, near syncope or syncope. He is active. He is feeling great. He is fishing often.   Primary Care Physician: No PCP Per Patient   Past Medical History:  Diagnosis Date  . Arthritis    knees  . Coronary artery disease    status post non-ST-elevation myocardial   infarction in May 2008 with treatment of a diagonal branch lesion  with a Taxus drug-eluting stent.  Marland Kitchen GERD (gastroesophageal reflux disease)    rare, mild  . Glaucoma   . Hyperlipidemia    with low HDL, improved on simvastatin.  Marland Kitchen Hypertension   . Myocardial infarction 2008  . Tremors of nervous system     Past Surgical History:  Procedure Laterality Date  . BACK SURGERY     lower  . CARDIAC CATHETERIZATION  2008  . CORONARY ANGIOPLASTY  2008  . coronary stents     . ELBOW SURGERY Right    tendon surgery  . EYE SURGERY Bilateral last 3 years ago    laser surgery x2 each- relieves pressure  . HAND SURGERY Right    replacement of 2 knuckles  . KNEE SURGERY Bilateral   . SHOULDER SURGERY Bilateral    rotator cuff repair  . TOTAL KNEE ARTHROPLASTY Left 12/03/2013   Procedure: LEFT TOTAL KNEE ARTHROPLASTY;  Surgeon: Tobi Bastos, MD;  Location:  [facility];  Service: Orthopedics;  Laterality: Left;  . TOTAL KNEE ARTHROPLASTY Right 06/23/2014   Procedure: RIGHT TOTAL KNEE ARTHROPLASTY;  Surgeon: Latanya Maudlin, MD;  Location: [facility];  Service: Orthopedics;  Laterality: Right;    Current Outpatient Prescriptions:  .  aspirin EC 81 MG tablet, Take 81 mg by mouth daily., Disp: , Rfl:  .  chlordiazePOXIDE (LIBRIUM) 25 MG capsule, Take one capsule by mouth once daily for Tremor, Disp: 30 capsule, Rfl: 5 .  lisinopril (PRINIVIL,ZESTRIL) 10 MG tablet, Take 1 tablet (10 mg total) by mouth daily., Disp: 30 tablet, Rfl: 11 .  propranolol ER (INDERAL LA) 60 MG 24 hr capsule, Take 1 capsule (60 mg total) by mouth every morning., Disp: 30 capsule, Rfl: 11 .  simvastatin (ZOCOR) 40 MG tablet, Take 1 tablet (40 mg total) by mouth every morning., Disp: 30 tablet, Rfl: 11 No current facility-administered medications for this visit.   Facility-Administered Medications Ordered in Other Visits:  .  dexamethasone (DECADRON) injection 10 mg, 10 mg, Intravenous, Once, [employer], PA-C .  tranexamic acid (CYKLOKAPRON) 2,000 mg in sodium chloride 0.9 % 50 mL Topical Application, 3,013 mg, Topical, Once, [employer], PA-C   No Known Allergies  Social History   Social History  . Marital status: Divorced  Spouse name: N/A  . Number of children: N/A  . Years of education: N/A   Occupational History  . retired    Social History Main Topics  . Smoking status: Former Smoker    Types: Cigarettes    Quit date: 07/27/1980  . Smokeless tobacco: Never Used  . Alcohol use No  . Drug use: No  . Sexual activity: Not on file   Other Topics Concern  . Not on file   Social History Narrative  . No narrative on file    Family History  Problem Relation Age of Onset  . Dementia Mother   . Heart failure Father     Review of Systems:  As stated in the HPI and otherwise negative.   BP 124/66   Pulse (!) 58   Ht _0  (1.727 m)   Wt 224 lb 3.2 oz  (101.7 kg)   BMI 34.09 kg/m   Physical Examination: General: Well developed, well nourished, NAD  HEENT: OP clear, mucus membranes moist  SKIN: warm, dry. No rashes. Neuro: No focal deficits  Musculoskeletal: Muscle strength 5/5 all ext  Psychiatric: Mood and affect normal  Neck: No JVD, no carotid bruits, no thyromegaly, no lymphadenopathy.  Lungs:Clear bilaterally, no wheezes, rhonci, crackles Cardiovascular: Regular rate and rhythm. No murmurs, gallops or rubs. Abdomen:Soft. Bowel sounds present. Non-tender.  Extremities: No lower extremity edema. Pulses are 2 + in the bilateral DP/PT.  EKG:  EKG is ordered today. The ekg ordered today demonstrates sinus, rate 61 bpm.   Recent Labs: No results found for requested labs within last 8760 hours.   Lipid Panel Followed in primary care   Wt Readings from Last 3 Encounters:  12/16/15 224 lb 3.2 oz (101.7 kg)  12/11/14 221 lb 12.8 oz (100.6 kg)  07/07/14 211 lb (95.7 kg)     Other studies Reviewed: Additional studies/ records that were reviewed today include: . Review of the above records demonstrates:    Assessment and Plan:   1. CAD: Stable.  Will continue current meds. We discussed adding Plavix to his ASA therapy based on the fact that he has a first generation Taxus DES but he refused as he had excessive bruising and bleeding on Plavix in the past.   2. HTN: BP controlled. No changes today. Check BMET  3. Hyperlipidemia: He is on a statin. Lipids had been followed in primary care but not checked lately as he is between primary care doctors. Will check lipids and LFTs today.    Current medicines are reviewed at length with the patient today.  The patient does not have concerns regarding medicines.  The following changes have been made:  no change  Labs/ tests ordered today include:   Orders Placed This Encounter  Procedures  . Comp Met (CMET)  . Lipid Profile  . EKG 12-Lead    Disposition:   FU with me in 12  months  Signed, Lauree Chandler, MD 12/16/2015 4:45 PM    [facility] [place], [place], [place]  [address] Phone: [phone]; Fax: [phone]

## 2015-12-21 ENCOUNTER — Other Ambulatory Visit: Payer: Self-pay | Admitting: *Deleted

## 2015-12-21 DIAGNOSIS — R945 Abnormal results of liver function studies: Secondary | ICD-10-CM

## 2015-12-21 DIAGNOSIS — R7989 Other specified abnormal findings of blood chemistry: Secondary | ICD-10-CM

## 2015-12-21 DIAGNOSIS — E7849 Other hyperlipidemia: Secondary | ICD-10-CM

## 2016-03-13 ENCOUNTER — Other Ambulatory Visit: Payer: Medicare PPO | Admitting: *Deleted

## 2016-03-13 DIAGNOSIS — E7800 Pure hypercholesterolemia, unspecified: Secondary | ICD-10-CM

## 2016-03-14 LAB — HEPATIC FUNCTION PANEL
ALK PHOS: 74 IU/L (ref 39–117)
ALT: 36 IU/L (ref 0–44)
AST: 23 IU/L (ref 0–40)
Albumin: 4.6 g/dL (ref 3.5–4.8)
BILIRUBIN, DIRECT: 0.11 mg/dL (ref 0.00–0.40)
Bilirubin Total: 0.5 mg/dL (ref 0.0–1.2)
TOTAL PROTEIN: 7 g/dL (ref 6.0–8.5)

## 2016-06-15 ENCOUNTER — Other Ambulatory Visit: Payer: Self-pay | Admitting: Cardiovascular Disease

## 2016-06-15 NOTE — Telephone Encounter (Signed)
Medication Detail    Disp Refills Start End   chlordiazePOXIDE (LIBRIUM) 25 MG capsule 30 capsule 5 12/16/2015    Sig: Take one capsule by mouth once daily for Tremor   Class: Print   Notes to Pharmacy: Further refills from primary care   Pharmacy   [facility] [address] [place], [place] - [address]

## 2016-09-13 DIAGNOSIS — Z139 Encounter for screening, unspecified: Secondary | ICD-10-CM | POA: Diagnosis not present

## 2016-09-13 DIAGNOSIS — E119 Type 2 diabetes mellitus without complications: Secondary | ICD-10-CM | POA: Diagnosis not present

## 2016-09-13 DIAGNOSIS — B354 Tinea corporis: Secondary | ICD-10-CM | POA: Diagnosis not present

## 2016-09-13 DIAGNOSIS — Z6832 Body mass index (BMI) 32.0-32.9, adult: Secondary | ICD-10-CM | POA: Diagnosis not present

## 2016-12-09 ENCOUNTER — Other Ambulatory Visit: Payer: Self-pay | Admitting: Cardiovascular Disease

## 2016-12-18 ENCOUNTER — Encounter (INDEPENDENT_AMBULATORY_CARE_PROVIDER_SITE_OTHER): Payer: Self-pay

## 2016-12-18 ENCOUNTER — Encounter: Payer: Self-pay | Admitting: Cardiovascular Disease

## 2016-12-18 ENCOUNTER — Ambulatory Visit (INDEPENDENT_AMBULATORY_CARE_PROVIDER_SITE_OTHER): Payer: Medicare PPO | Admitting: Cardiovascular Disease

## 2016-12-18 VITALS — BP 110/72 | HR 55 | Ht 68.0 in | Wt 198.6 lb

## 2016-12-18 DIAGNOSIS — I10 Essential (primary) hypertension: Secondary | ICD-10-CM

## 2016-12-18 DIAGNOSIS — I2510 Atherosclerotic heart disease of native coronary artery without angina pectoris: Secondary | ICD-10-CM

## 2016-12-18 DIAGNOSIS — E7800 Pure hypercholesterolemia, unspecified: Secondary | ICD-10-CM

## 2016-12-18 MED ORDER — PROPRANOLOL HCL ER 60 MG PO CP24: 60.0000 mg | ORAL_CAPSULE | Freq: Every morning | ORAL | 11 refills | Status: DC

## 2016-12-18 MED ORDER — SIMVASTATIN 40 MG PO TABS: 40.0000 mg | ORAL_TABLET | Freq: Every morning | ORAL | 11 refills | Status: DC

## 2016-12-18 MED ORDER — LISINOPRIL 10 MG PO TABS: 10.0000 mg | ORAL_TABLET | Freq: Every day | ORAL | 11 refills | Status: DC

## 2016-12-18 NOTE — Progress Notes (Signed)
Chief Complaint  Patient presents with  . Follow-up    Atherosclerosis of native coronary artery of native heart without angina pectoris    History of Present Illness: 76yo male with history of CAD, HTN, HLD here today for cardiac follow up. In 2007 he had a non-ST elevation MI treated with a Taxus stent placed in the diagonal branch of the LAD. He had 70-80% narrowing of the right coronary and later had a negative Myoview scan. His RCA disease has been managed medically since then.   He is here today for follow up. The patient denies any chest pain, dyspnea, palpitations, lower extremity edema, orthopnea, PND, dizziness, near syncope or syncope. He has been exercising 5 days per week at [employer]. He is using weights and getting 30 minutes of cardio per workout.   Primary Care Physician: Lonie Peak, PA-C  Past Medical History:  Diagnosis Date  . Arthritis    knees  . Coronary artery disease    status post non-ST-elevation myocardial   infarction in May 2008 with treatment of a diagonal branch lesion  with a Taxus drug-eluting stent.  Marland Kitchen GERD (gastroesophageal reflux disease)    rare, mild  . Glaucoma   . Hyperlipidemia    with low HDL, improved on simvastatin.  Marland Kitchen Hypertension   . Myocardial infarction (HCC) 2008  . Tremors of nervous system     Past Surgical History:  Procedure Laterality Date  . BACK SURGERY     lower  . CARDIAC CATHETERIZATION  2008  . CORONARY ANGIOPLASTY  2008  . coronary stents     . ELBOW SURGERY Right    tendon surgery  . EYE SURGERY Bilateral last 3 years ago    laser surgery x2 each- relieves pressure  . HAND SURGERY Right    replacement of 2 knuckles  . KNEE SURGERY Bilateral   . SHOULDER SURGERY Bilateral    rotator cuff repair  . TOTAL KNEE ARTHROPLASTY Left 12/03/2013   Procedure: LEFT TOTAL KNEE ARTHROPLASTY;  Surgeon: Jacki Cones, MD;  Location: [facility];  Service: Orthopedics;  Laterality: Left;  . TOTAL KNEE ARTHROPLASTY  Right 06/23/2014   Procedure: RIGHT TOTAL KNEE ARTHROPLASTY;  Surgeon: Ranee Gosselin, MD;  Location: [facility];  Service: Orthopedics;  Laterality: Right;   Current Outpatient Prescriptions  Medication Sig Dispense Refill  . aspirin EC 81 MG tablet Take 81 mg by mouth daily.    . chlordiazePOXIDE (LIBRIUM) 25 MG capsule Take one capsule by mouth once daily for Tremor 30 capsule 5  . lisinopril (PRINIVIL,ZESTRIL) 10 MG tablet Take 1 tablet (10 mg total) by mouth daily. 30 tablet 11  . metFORMIN (GLUCOPHAGE-XR) 500 MG 24 hr tablet Take 2 tablets by mouth daily.  0  . propranolol ER (INDERAL LA) 60 MG 24 hr capsule Take 1 capsule (60 mg total) by mouth every morning. 30 capsule 11  . simvastatin (ZOCOR) 40 MG tablet Take 1 tablet (40 mg total) by mouth every morning. 30 tablet 11   No current facility-administered medications for this visit.    Facility-Administered Medications Ordered in Other Visits  Medication Dose Route Frequency Provider Last Rate Last Dose  . dexamethasone (DECADRON) injection 10 mg  10 mg Intravenous Once [place], [facility], PA-C      . tranexamic acid (CYKLOKAPRON) 2,000 mg in sodium chloride 0.9 % 50 mL Topical Application  2,000 mg Topical Once [place], [facility], PA-C         No Known Allergies  Social History   Social History  . Marital status: Divorced    Spouse name: N/A  . Number of children: N/A  . Years of education: N/A   Occupational History  . retired    Social History Main Topics  . Smoking status: Former Smoker    Types: Cigarettes    Quit date: 07/27/1980  . Smokeless tobacco: Never Used  . Alcohol use No  . Drug use: No  . Sexual activity: Not on file   Other Topics Concern  . Not on file   Social History Narrative  . No narrative on file    Family History  Problem Relation Age of Onset  . Dementia Mother   . Heart failure Father     Review of Systems:  As stated in the HPI and otherwise negative.   BP 110/72   Pulse (!) 55    Ht  (1.727 m)   Wt 198 lb 9.6 oz (90.1 kg)   SpO2 98%   BMI 30.20 kg/m   Physical Examination:  General: Well developed, well nourished, NAD  HEENT: OP clear, mucus membranes moist  SKIN: warm, dry. No rashes. Neuro: No focal deficits  Musculoskeletal: Muscle strength 5/5 all ext  Psychiatric: Mood and affect normal  Neck: No JVD, no carotid bruits, no thyromegaly, no lymphadenopathy.  Lungs:Clear bilaterally, no wheezes, rhonci, crackles Cardiovascular: Regular rate and rhythm. No murmurs, gallops or rubs. Abdomen:Soft. Bowel sounds present. Non-tender.  Extremities: No lower extremity edema. Pulses are 2 + in the bilateral DP/PT.  EKG:  EKG is ordered today. The ekg ordered today demonstrates sinus bradycardia, rate 50 bpm.   Recent Labs: 03/13/2016: ALT 36   Lipid Panel Lipid Panel     Component Value Date/Time   CHOL 120 (L) 12/16/2015 1633   TRIG 243 (H) 12/16/2015 1633   HDL 35 (L) 12/16/2015 1633   CHOLHDL 3.4 12/16/2015 1633   VLDL 49 (H) 12/16/2015 1633   LDLCALC 36 12/16/2015 1633     Wt Readings from Last 3 Encounters:  12/18/16 198 lb 9.6 oz (90.1 kg)  12/16/15 224 lb 3.2 oz (101.7 kg)  12/11/14 221 lb 12.8 oz (100.6 kg)     Other studies Reviewed: Additional studies/ records that were reviewed today include: . Review of the above records demonstrates:    Assessment and Plan:   1. CAD without angina: He has no chest pain suggestive of angina. Will continue ASA, beta blocker, Ace-inh and statin. We have discussed adding Plavix to his ASA therapy based on the fact that he has a first generation Taxus DES but he has refused as he had excessive bruising and bleeding on Plavix in the past. Continue heart healthy diet and daily exercise.   2. HTN: BP is well controlled. No changes.   3. Hyperlipidemia: LDL at goal. Continue statin.     Current medicines are reviewed at length with the patient today.  The patient does not have concerns regarding  medicines.  The following changes have been made:  no change  Labs/ tests ordered today include:   Orders Placed This Encounter  Procedures  . EKG 12-Lead    Disposition:   FU with me in 12 months  Signed, Verne Carrow, MD 12/18/2016 9:36 AM    [facility] [address] [place], [place], [place]  [address] Phone: [phone]; Fax: [phone]

## 2016-12-18 NOTE — Patient Instructions (Signed)

## 2017-01-15 DIAGNOSIS — E1165 Type 2 diabetes mellitus with hyperglycemia: Secondary | ICD-10-CM | POA: Diagnosis not present

## 2017-01-15 DIAGNOSIS — Z125 Encounter for screening for malignant neoplasm of prostate: Secondary | ICD-10-CM | POA: Diagnosis not present

## 2017-01-15 DIAGNOSIS — Z136 Encounter for screening for cardiovascular disorders: Secondary | ICD-10-CM | POA: Diagnosis not present

## 2017-01-15 DIAGNOSIS — Z139 Encounter for screening, unspecified: Secondary | ICD-10-CM | POA: Diagnosis not present

## 2017-01-15 DIAGNOSIS — Z23 Encounter for immunization: Secondary | ICD-10-CM | POA: Diagnosis not present

## 2017-01-15 DIAGNOSIS — E785 Hyperlipidemia, unspecified: Secondary | ICD-10-CM | POA: Diagnosis not present

## 2017-01-15 DIAGNOSIS — E669 Obesity, unspecified: Secondary | ICD-10-CM | POA: Diagnosis not present

## 2017-01-15 DIAGNOSIS — Z79899 Other long term (current) drug therapy: Secondary | ICD-10-CM | POA: Diagnosis not present

## 2017-01-15 DIAGNOSIS — Z6830 Body mass index (BMI) 30.0-30.9, adult: Secondary | ICD-10-CM | POA: Diagnosis not present

## 2017-01-15 DIAGNOSIS — Z0000 Encounter for general adult medical examination without abnormal findings: Secondary | ICD-10-CM | POA: Diagnosis not present

## 2017-01-16 DIAGNOSIS — I2510 Atherosclerotic heart disease of native coronary artery without angina pectoris: Secondary | ICD-10-CM | POA: Diagnosis not present

## 2017-01-16 DIAGNOSIS — E875 Hyperkalemia: Secondary | ICD-10-CM | POA: Diagnosis not present

## 2017-01-16 DIAGNOSIS — G250 Essential tremor: Secondary | ICD-10-CM | POA: Diagnosis not present

## 2017-01-16 DIAGNOSIS — M19041 Primary osteoarthritis, right hand: Secondary | ICD-10-CM | POA: Diagnosis not present

## 2017-01-16 DIAGNOSIS — Z79899 Other long term (current) drug therapy: Secondary | ICD-10-CM | POA: Diagnosis not present

## 2017-01-16 DIAGNOSIS — E119 Type 2 diabetes mellitus without complications: Secondary | ICD-10-CM | POA: Diagnosis not present

## 2017-01-16 DIAGNOSIS — R972 Elevated prostate specific antigen [PSA]: Secondary | ICD-10-CM | POA: Diagnosis not present

## 2017-01-16 DIAGNOSIS — Z6830 Body mass index (BMI) 30.0-30.9, adult: Secondary | ICD-10-CM | POA: Diagnosis not present

## 2017-01-18 DIAGNOSIS — E875 Hyperkalemia: Secondary | ICD-10-CM | POA: Diagnosis not present

## 2017-02-15 DIAGNOSIS — R972 Elevated prostate specific antigen [PSA]: Secondary | ICD-10-CM | POA: Diagnosis not present

## 2017-02-15 DIAGNOSIS — M25511 Pain in right shoulder: Secondary | ICD-10-CM | POA: Diagnosis not present

## 2017-05-17 DIAGNOSIS — R972 Elevated prostate specific antigen [PSA]: Secondary | ICD-10-CM | POA: Diagnosis not present

## 2017-05-28 DIAGNOSIS — M25511 Pain in right shoulder: Secondary | ICD-10-CM | POA: Diagnosis not present

## 2017-05-28 DIAGNOSIS — E119 Type 2 diabetes mellitus without complications: Secondary | ICD-10-CM | POA: Diagnosis not present

## 2017-07-16 DIAGNOSIS — R972 Elevated prostate specific antigen [PSA]: Secondary | ICD-10-CM | POA: Diagnosis not present

## 2017-07-16 DIAGNOSIS — Z9181 History of falling: Secondary | ICD-10-CM | POA: Diagnosis not present

## 2017-07-16 DIAGNOSIS — G250 Essential tremor: Secondary | ICD-10-CM | POA: Diagnosis not present

## 2017-07-16 DIAGNOSIS — Z6830 Body mass index (BMI) 30.0-30.9, adult: Secondary | ICD-10-CM | POA: Diagnosis not present

## 2017-07-16 DIAGNOSIS — Z79899 Other long term (current) drug therapy: Secondary | ICD-10-CM | POA: Diagnosis not present

## 2017-07-16 DIAGNOSIS — E119 Type 2 diabetes mellitus without complications: Secondary | ICD-10-CM | POA: Diagnosis not present

## 2017-07-16 DIAGNOSIS — E785 Hyperlipidemia, unspecified: Secondary | ICD-10-CM | POA: Diagnosis not present

## 2017-07-16 DIAGNOSIS — I2510 Atherosclerotic heart disease of native coronary artery without angina pectoris: Secondary | ICD-10-CM | POA: Diagnosis not present

## 2017-07-23 DIAGNOSIS — E875 Hyperkalemia: Secondary | ICD-10-CM | POA: Diagnosis not present

## 2017-08-06 DIAGNOSIS — M25511 Pain in right shoulder: Secondary | ICD-10-CM | POA: Diagnosis not present

## 2017-12-12 ENCOUNTER — Other Ambulatory Visit: Payer: Self-pay | Admitting: Cardiovascular Disease

## 2017-12-18 DIAGNOSIS — D225 Melanocytic nevi of trunk: Secondary | ICD-10-CM | POA: Diagnosis not present

## 2017-12-18 DIAGNOSIS — X32XXXA Exposure to sunlight, initial encounter: Secondary | ICD-10-CM | POA: Diagnosis not present

## 2017-12-18 DIAGNOSIS — L570 Actinic keratosis: Secondary | ICD-10-CM | POA: Diagnosis not present

## 2017-12-18 DIAGNOSIS — L218 Other seborrheic dermatitis: Secondary | ICD-10-CM | POA: Diagnosis not present

## 2017-12-18 NOTE — Progress Notes (Signed)
Chief Complaint  Patient presents with  . Follow-up    CAD   History of Present Illness: 77 yo male with history of CAD, HTN and hyperlipidemia here today for cardiac follow up. In 2007 he had a non-ST elevation MI treated with a Taxus stent placed in the diagonal branch of the LAD. He had 70-80% narrowing of the right coronary and later had a negative Myoview scan. His RCA disease has been managed medically since then.   He is here today for follow up. The patient denies any chest pain, dyspnea, palpitations, lower extremity edema, orthopnea, PND, dizziness, near syncope or syncope. He walks on the treadmill 45 minutes every day. He lifts light weights. He also rides the stationary bicycle almost every day. He feels great.   Primary Care Physician: Lonie Peak, PA-C  Past Medical History:  Diagnosis Date  . Arthritis    knees  . Coronary artery disease    status post non-ST-elevation myocardial   infarction in May 2008 with treatment of a diagonal branch lesion  with a Taxus drug-eluting stent.  Marland Kitchen GERD (gastroesophageal reflux disease)    rare, mild  . Glaucoma   . Hyperlipidemia    with low HDL, improved on simvastatin.  Marland Kitchen Hypertension   . Myocardial infarction (HCC) 2008  . Tremors of nervous system     Past Surgical History:  Procedure Laterality Date  . BACK SURGERY     lower  . CARDIAC CATHETERIZATION  2008  . CORONARY ANGIOPLASTY  2008  . coronary stents     . ELBOW SURGERY Right    tendon surgery  . EYE SURGERY Bilateral last 3 years ago    laser surgery x2 each- relieves pressure  . HAND SURGERY Right    replacement of 2 knuckles  . KNEE SURGERY Bilateral   . SHOULDER SURGERY Bilateral    rotator cuff repair  . TOTAL KNEE ARTHROPLASTY Left 12/03/2013   Procedure: LEFT TOTAL KNEE ARTHROPLASTY;  Surgeon: Jacki Cones, MD;  Location: [facility];  Service: Orthopedics;  Laterality: Left;  . TOTAL KNEE ARTHROPLASTY Right 06/23/2014   Procedure: RIGHT TOTAL  KNEE ARTHROPLASTY;  Surgeon: Ranee Gosselin, MD;  Location: [facility];  Service: Orthopedics;  Laterality: Right;   Current Outpatient Medications  Medication Sig Dispense Refill  . aspirin EC 81 MG tablet Take 81 mg by mouth daily.    . chlordiazePOXIDE (LIBRIUM) 25 MG capsule Take one capsule by mouth once daily for Tremor 30 capsule 5  . fluticasone (CUTIVATE) 0.05 % cream Apply 1 application topically as needed.    Marland Kitchen lisinopril (PRINIVIL,ZESTRIL) 10 MG tablet Take 1 tablet (10 mg total) by mouth daily. 30 tablet 11  . meloxicam (MOBIC) 15 MG tablet Take 1 tablet by mouth as needed.  7  . propranolol ER (INDERAL LA) 60 MG 24 hr capsule Take 1 capsule (60 mg total) by mouth every morning. 30 capsule 11  . simvastatin (ZOCOR) 40 MG tablet Take 1 tablet (40 mg total) by mouth every morning. 30 tablet 11   No current facility-administered medications for this visit.    Facility-Administered Medications Ordered in Other Visits  Medication Dose Route Frequency Provider Last Rate Last Dose  . dexamethasone (DECADRON) injection 10 mg  10 mg Intravenous Once [place], [facility], PA-C      . tranexamic acid (CYKLOKAPRON) 2,000 mg in sodium chloride 0.9 % 50 mL Topical Application  2,000 mg Topical Once Celedonio Savage, [facility], PA-C  No Known Allergies  Social History   Socioeconomic History  . Marital status: Divorced    Spouse name: Not on file  . Number of children: Not on file  . Years of education: Not on file  . Highest education level: Not on file  Occupational History  . Occupation: retired  Engineer, production  . Financial resource strain: Not on file  . Food insecurity:    Worry: Not on file    Inability: Not on file  . Transportation needs:    Medical: Not on file    Non-medical: Not on file  Tobacco Use  . Smoking status: Former Smoker    Types: Cigarettes    Last attempt to quit: 07/27/1980    Years since quitting: 37.4  . Smokeless tobacco: Never Used  Substance and Sexual  Activity  . Alcohol use: No  . Drug use: No  . Sexual activity: Not on file  Lifestyle  . Physical activity:    Days per week: Not on file    Minutes per session: Not on file  . Stress: Not on file  Relationships  . Social connections:    Talks on phone: Not on file    Gets together: Not on file    Attends religious service: Not on file    Active member of club or organization: Not on file    Attends meetings of clubs or organizations: Not on file    Relationship status: Not on file  . Intimate partner violence:    Fear of current or ex partner: Not on file    Emotionally abused: Not on file    Physically abused: Not on file    Forced sexual activity: Not on file  Other Topics Concern  . Not on file  Social History Narrative  . Not on file    Family History  Problem Relation Age of Onset  . Dementia Mother   . Heart failure Father     Review of Systems:  As stated in the HPI and otherwise negative.   BP 128/72   Pulse (!) 54   Ht 5\' 8"  (1.727 m)   Wt 204 lb (92.5 kg)   SpO2 98%   BMI 31.02 kg/m   Physical Examination:  General: Well developed, well nourished, NAD  HEENT: OP clear, mucus membranes moist  SKIN: warm, dry. No rashes. Neuro: No focal deficits  Musculoskeletal: Muscle strength 5/5 all ext  Psychiatric: Mood and affect normal  Neck: No JVD, no carotid bruits, no thyromegaly, no lymphadenopathy.  Lungs:Clear bilaterally, no wheezes, rhonci, crackles Cardiovascular: Regular rate and rhythm. No murmurs, gallops or rubs. Abdomen:Soft. Bowel sounds present. Non-tender.  Extremities: No lower extremity edema. Pulses are 2 + in the bilateral DP/PT.  EKG:  EKG is ordered today. The ekg ordered today demonstrates Sinus bradycardia, rate 54 bpm. PAC.   Recent Labs: No results found for requested labs within last 8760 hours.   Lipid Panel Lipid Panel     Component Value Date/Time   CHOL 120 (L) 12/16/2015 1633   TRIG 243 (H) 12/16/2015 1633   HDL  35 (L) 12/16/2015 1633   CHOLHDL 3.4 12/16/2015 1633   VLDL 49 (H) 12/16/2015 1633   LDLCALC 36 12/16/2015 1633     Wt Readings from Last 3 Encounters:  12/19/17 204 lb (92.5 kg)  12/18/16 198 lb 9.6 oz (90.1 kg)  12/16/15 224 lb 3.2 oz (101.7 kg)     Other studies Reviewed: Additional studies/ records that were  reviewed today include: . Review of the above records demonstrates:    Assessment and Plan:   1. CAD without angina: We have discussed adding Plavix to his ASA therapy in the past based on the fact that he has a first generation Taxus DES but he has refused as he had excessive bruising and bleeding on Plavix in the past. He has no chest pain. Will continue ASA, statin and beta blocker.  We discussed arranging an echo to assess LV function but he does not wish to do at this time as he feels great. Cardiac exam is normal except for his bradycardia.   2. HTN: BP is controlled. No changes.   3. Hyperlipidemia: Lipids well controlled. LDL was 30 in primary care in May 2019. Continue statin.    Current medicines are reviewed at length with the patient today.  The patient does not have concerns regarding medicines.  The following changes have been made:  no change  Labs/ tests ordered today include:   Orders Placed This Encounter  Procedures  . EKG 12-Lead    Disposition:   FU with me in 12 months  Signed, Verne Carrow, MD 12/19/2017 9:23 AM    [facility] [address] [place], [place], [place]  [address] Phone: [phone]; Fax: [phone]

## 2017-12-19 ENCOUNTER — Encounter: Payer: Self-pay | Admitting: Cardiovascular Disease

## 2017-12-19 ENCOUNTER — Encounter (INDEPENDENT_AMBULATORY_CARE_PROVIDER_SITE_OTHER): Payer: Self-pay

## 2017-12-19 ENCOUNTER — Ambulatory Visit: Payer: Medicare PPO | Admitting: Cardiovascular Disease

## 2017-12-19 VITALS — BP 128/72 | HR 54 | Ht 68.0 in | Wt 204.0 lb

## 2017-12-19 DIAGNOSIS — E7800 Pure hypercholesterolemia, unspecified: Secondary | ICD-10-CM | POA: Diagnosis not present

## 2017-12-19 DIAGNOSIS — I10 Essential (primary) hypertension: Secondary | ICD-10-CM

## 2017-12-19 DIAGNOSIS — I2510 Atherosclerotic heart disease of native coronary artery without angina pectoris: Secondary | ICD-10-CM | POA: Diagnosis not present

## 2017-12-19 MED ORDER — PROPRANOLOL HCL ER 60 MG PO CP24: 60.0000 mg | ORAL_CAPSULE | Freq: Every morning | ORAL | 11 refills | Status: DC

## 2017-12-19 MED ORDER — LISINOPRIL 10 MG PO TABS: 10.0000 mg | ORAL_TABLET | Freq: Every day | ORAL | 11 refills | Status: DC

## 2017-12-19 MED ORDER — SIMVASTATIN 40 MG PO TABS: 40.0000 mg | ORAL_TABLET | Freq: Every morning | ORAL | 11 refills | Status: DC

## 2017-12-19 NOTE — Patient Instructions (Signed)

## 2018-01-12 ENCOUNTER — Other Ambulatory Visit: Payer: Self-pay | Admitting: Cardiovascular Disease

## 2018-01-17 DIAGNOSIS — Z23 Encounter for immunization: Secondary | ICD-10-CM | POA: Diagnosis not present

## 2018-01-17 DIAGNOSIS — Z79899 Other long term (current) drug therapy: Secondary | ICD-10-CM | POA: Diagnosis not present

## 2018-01-17 DIAGNOSIS — E119 Type 2 diabetes mellitus without complications: Secondary | ICD-10-CM | POA: Diagnosis not present

## 2018-01-17 DIAGNOSIS — Z1331 Encounter for screening for depression: Secondary | ICD-10-CM | POA: Diagnosis not present

## 2018-01-17 DIAGNOSIS — Z1339 Encounter for screening examination for other mental health and behavioral disorders: Secondary | ICD-10-CM | POA: Diagnosis not present

## 2018-01-17 DIAGNOSIS — R972 Elevated prostate specific antigen [PSA]: Secondary | ICD-10-CM | POA: Diagnosis not present

## 2018-01-17 DIAGNOSIS — M19041 Primary osteoarthritis, right hand: Secondary | ICD-10-CM | POA: Diagnosis not present

## 2018-01-17 DIAGNOSIS — G250 Essential tremor: Secondary | ICD-10-CM | POA: Diagnosis not present

## 2018-01-17 DIAGNOSIS — I2510 Atherosclerotic heart disease of native coronary artery without angina pectoris: Secondary | ICD-10-CM | POA: Diagnosis not present

## 2018-01-17 DIAGNOSIS — E785 Hyperlipidemia, unspecified: Secondary | ICD-10-CM | POA: Diagnosis not present

## 2018-01-24 ENCOUNTER — Emergency Department (HOSPITAL_COMMUNITY): Payer: Medicare PPO

## 2018-01-24 ENCOUNTER — Encounter (HOSPITAL_COMMUNITY): Payer: Self-pay

## 2018-01-24 ENCOUNTER — Inpatient Hospital Stay (HOSPITAL_COMMUNITY): Payer: Medicare PPO

## 2018-01-24 ENCOUNTER — Other Ambulatory Visit: Payer: Self-pay | Admitting: Neurosurgery

## 2018-01-24 ENCOUNTER — Inpatient Hospital Stay (HOSPITAL_COMMUNITY)
Admission: EM | Admit: 2018-01-24 | Discharge: 2018-02-07 | DRG: 471 | Disposition: A | Discharge status: Rehabilitation Facility | Payer: Medicare PPO | Attending: General Surgery | Admitting: General Surgery

## 2018-01-24 DIAGNOSIS — R609 Edema, unspecified: Secondary | ICD-10-CM | POA: Diagnosis not present

## 2018-01-24 DIAGNOSIS — S12590A Other displaced fracture of sixth cervical vertebra, initial encounter for closed fracture: Secondary | ICD-10-CM | POA: Diagnosis not present

## 2018-01-24 DIAGNOSIS — Z9911 Dependence on respirator [ventilator] status: Secondary | ICD-10-CM | POA: Diagnosis not present

## 2018-01-24 DIAGNOSIS — R402142 Coma scale, eyes open, spontaneous, at arrival to emergency department: Secondary | ICD-10-CM | POA: Diagnosis present

## 2018-01-24 DIAGNOSIS — G8250 Quadriplegia, unspecified: Secondary | ICD-10-CM

## 2018-01-24 DIAGNOSIS — D62 Acute posthemorrhagic anemia: Secondary | ICD-10-CM | POA: Diagnosis not present

## 2018-01-24 DIAGNOSIS — T794XXA Traumatic shock, initial encounter: Secondary | ICD-10-CM | POA: Diagnosis not present

## 2018-01-24 DIAGNOSIS — S130XXA Traumatic rupture of cervical intervertebral disc, initial encounter: Secondary | ICD-10-CM | POA: Diagnosis not present

## 2018-01-24 DIAGNOSIS — I2510 Atherosclerotic heart disease of native coronary artery without angina pectoris: Secondary | ICD-10-CM

## 2018-01-24 DIAGNOSIS — S12400A Unspecified displaced fracture of fifth cervical vertebra, initial encounter for closed fracture: Secondary | ICD-10-CM | POA: Diagnosis present

## 2018-01-24 DIAGNOSIS — S161XXA Strain of muscle, fascia and tendon at neck level, initial encounter: Secondary | ICD-10-CM | POA: Diagnosis present

## 2018-01-24 DIAGNOSIS — G9611 Dural tear: Secondary | ICD-10-CM | POA: Diagnosis not present

## 2018-01-24 DIAGNOSIS — S14106A Unspecified injury at C6 level of cervical spinal cord, initial encounter: Secondary | ICD-10-CM | POA: Diagnosis not present

## 2018-01-24 DIAGNOSIS — J9690 Respiratory failure, unspecified, unspecified whether with hypoxia or hypercapnia: Secondary | ICD-10-CM

## 2018-01-24 DIAGNOSIS — R509 Fever, unspecified: Secondary | ICD-10-CM | POA: Diagnosis not present

## 2018-01-24 DIAGNOSIS — G8254 Quadriplegia, C5-C7 incomplete: Secondary | ICD-10-CM | POA: Diagnosis not present

## 2018-01-24 DIAGNOSIS — M25511 Pain in right shoulder: Secondary | ICD-10-CM | POA: Diagnosis not present

## 2018-01-24 DIAGNOSIS — S129XXA Fracture of neck, unspecified, initial encounter: Secondary | ICD-10-CM

## 2018-01-24 DIAGNOSIS — R402252 Coma scale, best verbal response, oriented, at arrival to emergency department: Secondary | ICD-10-CM | POA: Diagnosis present

## 2018-01-24 DIAGNOSIS — M4712 Other spondylosis with myelopathy, cervical region: Secondary | ICD-10-CM | POA: Diagnosis not present

## 2018-01-24 DIAGNOSIS — S199XXA Unspecified injury of neck, initial encounter: Secondary | ICD-10-CM | POA: Diagnosis not present

## 2018-01-24 DIAGNOSIS — M5000 Cervical disc disorder with myelopathy, unspecified cervical region: Secondary | ICD-10-CM | POA: Diagnosis not present

## 2018-01-24 DIAGNOSIS — G250 Essential tremor: Secondary | ICD-10-CM | POA: Diagnosis present

## 2018-01-24 DIAGNOSIS — I7774 Dissection of vertebral artery: Secondary | ICD-10-CM | POA: Diagnosis not present

## 2018-01-24 DIAGNOSIS — S3993XA Unspecified injury of pelvis, initial encounter: Secondary | ICD-10-CM | POA: Diagnosis not present

## 2018-01-24 DIAGNOSIS — R578 Other shock: Secondary | ICD-10-CM | POA: Diagnosis present

## 2018-01-24 DIAGNOSIS — N319 Neuromuscular dysfunction of bladder, unspecified: Secondary | ICD-10-CM | POA: Diagnosis not present

## 2018-01-24 DIAGNOSIS — Z955 Presence of coronary angioplasty implant and graft: Secondary | ICD-10-CM

## 2018-01-24 DIAGNOSIS — S14109D Unspecified injury at unspecified level of cervical spinal cord, subsequent encounter: Secondary | ICD-10-CM | POA: Diagnosis not present

## 2018-01-24 DIAGNOSIS — G959 Disease of spinal cord, unspecified: Secondary | ICD-10-CM | POA: Diagnosis present

## 2018-01-24 DIAGNOSIS — S14109A Unspecified injury at unspecified level of cervical spinal cord, initial encounter: Secondary | ICD-10-CM

## 2018-01-24 DIAGNOSIS — Z96653 Presence of artificial knee joint, bilateral: Secondary | ICD-10-CM | POA: Diagnosis present

## 2018-01-24 DIAGNOSIS — R1310 Dysphagia, unspecified: Secondary | ICD-10-CM | POA: Diagnosis not present

## 2018-01-24 DIAGNOSIS — S299XXA Unspecified injury of thorax, initial encounter: Secondary | ICD-10-CM | POA: Diagnosis not present

## 2018-01-24 DIAGNOSIS — R000 Tachycardia, unspecified: Secondary | ICD-10-CM | POA: Diagnosis not present

## 2018-01-24 DIAGNOSIS — Z981 Arthrodesis status: Secondary | ICD-10-CM | POA: Diagnosis not present

## 2018-01-24 DIAGNOSIS — R402362 Coma scale, best motor response, obeys commands, at arrival to emergency department: Secondary | ICD-10-CM | POA: Diagnosis present

## 2018-01-24 DIAGNOSIS — F4322 Adjustment disorder with anxiety: Secondary | ICD-10-CM | POA: Diagnosis not present

## 2018-01-24 DIAGNOSIS — S0101XA Laceration without foreign body of scalp, initial encounter: Secondary | ICD-10-CM | POA: Diagnosis present

## 2018-01-24 DIAGNOSIS — Z8781 Personal history of (healed) traumatic fracture: Secondary | ICD-10-CM

## 2018-01-24 DIAGNOSIS — I10 Essential (primary) hypertension: Secondary | ICD-10-CM

## 2018-01-24 DIAGNOSIS — S12501A Unspecified nondisplaced fracture of sixth cervical vertebra, initial encounter for closed fracture: Secondary | ICD-10-CM | POA: Diagnosis not present

## 2018-01-24 DIAGNOSIS — Z79899 Other long term (current) drug therapy: Secondary | ICD-10-CM | POA: Diagnosis not present

## 2018-01-24 DIAGNOSIS — A499 Bacterial infection, unspecified: Secondary | ICD-10-CM | POA: Diagnosis not present

## 2018-01-24 DIAGNOSIS — K5900 Constipation, unspecified: Secondary | ICD-10-CM | POA: Diagnosis not present

## 2018-01-24 DIAGNOSIS — S0990XA Unspecified injury of head, initial encounter: Secondary | ICD-10-CM | POA: Diagnosis not present

## 2018-01-24 DIAGNOSIS — R4182 Altered mental status, unspecified: Secondary | ICD-10-CM | POA: Diagnosis not present

## 2018-01-24 DIAGNOSIS — J9600 Acute respiratory failure, unspecified whether with hypoxia or hypercapnia: Secondary | ICD-10-CM | POA: Diagnosis not present

## 2018-01-24 DIAGNOSIS — E785 Hyperlipidemia, unspecified: Secondary | ICD-10-CM | POA: Diagnosis not present

## 2018-01-24 DIAGNOSIS — I252 Old myocardial infarction: Secondary | ICD-10-CM

## 2018-01-24 DIAGNOSIS — S3991XA Unspecified injury of abdomen, initial encounter: Secondary | ICD-10-CM | POA: Diagnosis not present

## 2018-01-24 DIAGNOSIS — S12500A Unspecified displaced fracture of sixth cervical vertebra, initial encounter for closed fracture: Secondary | ICD-10-CM | POA: Diagnosis not present

## 2018-01-24 DIAGNOSIS — M532X2 Spinal instabilities, cervical region: Secondary | ICD-10-CM | POA: Diagnosis not present

## 2018-01-24 DIAGNOSIS — Y92410 Unspecified street and highway as the place of occurrence of the external cause: Secondary | ICD-10-CM | POA: Diagnosis not present

## 2018-01-24 DIAGNOSIS — M1990 Unspecified osteoarthritis, unspecified site: Secondary | ICD-10-CM | POA: Diagnosis present

## 2018-01-24 DIAGNOSIS — Z87891 Personal history of nicotine dependence: Secondary | ICD-10-CM

## 2018-01-24 DIAGNOSIS — S12500D Unspecified displaced fracture of sixth cervical vertebra, subsequent encounter for fracture with routine healing: Secondary | ICD-10-CM | POA: Diagnosis not present

## 2018-01-24 DIAGNOSIS — S14156A Other incomplete lesion at C6 level of cervical spinal cord, initial encounter: Secondary | ICD-10-CM | POA: Diagnosis not present

## 2018-01-24 DIAGNOSIS — S0101XS Laceration without foreign body of scalp, sequela: Secondary | ICD-10-CM | POA: Diagnosis not present

## 2018-01-24 DIAGNOSIS — Z4789 Encounter for other orthopedic aftercare: Secondary | ICD-10-CM | POA: Diagnosis not present

## 2018-01-24 DIAGNOSIS — Z419 Encounter for procedure for purposes other than remedying health state, unspecified: Secondary | ICD-10-CM

## 2018-01-24 DIAGNOSIS — I959 Hypotension, unspecified: Secondary | ICD-10-CM | POA: Diagnosis not present

## 2018-01-24 DIAGNOSIS — K592 Neurogenic bowel, not elsewhere classified: Secondary | ICD-10-CM | POA: Diagnosis not present

## 2018-01-24 DIAGNOSIS — K921 Melena: Secondary | ICD-10-CM | POA: Diagnosis not present

## 2018-01-24 DIAGNOSIS — N390 Urinary tract infection, site not specified: Secondary | ICD-10-CM | POA: Diagnosis not present

## 2018-01-24 DIAGNOSIS — Z7982 Long term (current) use of aspirin: Secondary | ICD-10-CM

## 2018-01-24 DIAGNOSIS — S14106S Unspecified injury at C6 level of cervical spinal cord, sequela: Secondary | ICD-10-CM | POA: Diagnosis not present

## 2018-01-24 DIAGNOSIS — S14105A Unspecified injury at C5 level of cervical spinal cord, initial encounter: Secondary | ICD-10-CM | POA: Diagnosis not present

## 2018-01-24 LAB — PROTIME-INR
INR: 1.47
Prothrombin Time: 17.7 seconds — ABNORMAL HIGH (ref 11.4–15.2)

## 2018-01-24 LAB — I-STAT CHEM 8, ED
BUN: 17 mg/dL (ref 8–23)
Calcium, Ion: 0.97 mmol/L — ABNORMAL LOW (ref 1.15–1.40)
Chloride: 113 mmol/L — ABNORMAL HIGH (ref 98–111)
Creatinine, Ser: 0.8 mg/dL (ref 0.61–1.24)
Glucose, Bld: 131 mg/dL — ABNORMAL HIGH (ref 70–99)
HEMATOCRIT: 34 % — AB (ref 39.0–52.0)
HEMOGLOBIN: 11.6 g/dL — AB (ref 13.0–17.0)
Potassium: 3.4 mmol/L — ABNORMAL LOW (ref 3.5–5.1)
Sodium: 142 mmol/L (ref 135–145)
TCO2: 20 mmol/L — AB (ref 22–32)

## 2018-01-24 LAB — I-STAT ARTERIAL BLOOD GAS, ED
Acid-base deficit: 3 mmol/L — ABNORMAL HIGH (ref 0.0–2.0)
Bicarbonate: 22.7 mmol/L (ref 20.0–28.0)
O2 Saturation: 100 %
PCO2 ART: 42.6 mmHg (ref 32.0–48.0)
Patient temperature: 96.3
TCO2: 24 mmol/L (ref 22–32)
pH, Arterial: 7.329 — ABNORMAL LOW (ref 7.350–7.450)
pO2, Arterial: 459 mmHg — ABNORMAL HIGH (ref 83.0–108.0)

## 2018-01-24 LAB — URINALYSIS, ROUTINE W REFLEX MICROSCOPIC
BILIRUBIN URINE: NEGATIVE
GLUCOSE, UA: NEGATIVE mg/dL
Hgb urine dipstick: NEGATIVE
KETONES UR: NEGATIVE mg/dL
LEUKOCYTES UA: NEGATIVE
NITRITE: NEGATIVE
PH: 5 (ref 5.0–8.0)
Protein, ur: 30 mg/dL — AB
Specific Gravity, Urine: 1.028 (ref 1.005–1.030)

## 2018-01-24 LAB — POCT I-STAT 3, ART BLOOD GAS (G3+)
Bicarbonate: 22.6 mmol/L (ref 20.0–28.0)
O2 Saturation: 99 %
PCO2 ART: 30.6 mmHg — AB (ref 32.0–48.0)
PO2 ART: 115 mmHg — AB (ref 83.0–108.0)
TCO2: 24 mmol/L (ref 22–32)
pH, Arterial: 7.478 — ABNORMAL HIGH (ref 7.350–7.450)

## 2018-01-24 LAB — COMPREHENSIVE METABOLIC PANEL
ALBUMIN: 2.1 g/dL — AB (ref 3.5–5.0)
ALT: 17 U/L (ref 0–44)
ANION GAP: 3 — AB (ref 5–15)
AST: 16 U/L (ref 15–41)
Alkaline Phosphatase: 39 U/L (ref 38–126)
BUN: 13 mg/dL (ref 8–23)
CHLORIDE: 124 mmol/L — AB (ref 98–111)
CO2: 15 mmol/L — AB (ref 22–32)
Calcium: 5.2 mg/dL — CL (ref 8.9–10.3)
Creatinine, Ser: 0.74 mg/dL (ref 0.61–1.24)
GFR calc non Af Amer: >60 mL/min (ref >60)
GLUCOSE: 111 mg/dL — AB (ref 70–99)
Potassium: 2.5 mmol/L — CL (ref 3.5–5.1)
Sodium: 142 mmol/L (ref 135–145)
Total Bilirubin: 0.4 mg/dL (ref 0.3–1.2)
Total Protein: 3.3 g/dL — ABNORMAL LOW (ref 6.5–8.1)

## 2018-01-24 LAB — ABO/RH: ABO/RH(D): A POS

## 2018-01-24 LAB — BPAM FFP
BLOOD PRODUCT EXPIRATION DATE: 201911232359
Blood Product Expiration Date: 201911232359
ISSUE DATE / TIME: 201911210844
ISSUE DATE / TIME: 201911210844
UNIT TYPE AND RH: 6200
Unit Type and Rh: 6200

## 2018-01-24 LAB — TYPE AND SCREEN
ABO/RH(D): A POS
Antibody Screen: NEGATIVE
UNIT DIVISION: 0
Unit division: 0

## 2018-01-24 LAB — CBC
HCT: 31.1 % — ABNORMAL LOW (ref 39.0–52.0)
Hemoglobin: 9.8 g/dL — ABNORMAL LOW (ref 13.0–17.0)
MCH: 30 pg (ref 26.0–34.0)
MCHC: 31.5 g/dL (ref 30.0–36.0)
MCV: 95.1 fL (ref 80.0–100.0)
NRBC: 0 % (ref 0.0–0.2)
Platelets: 119 10*3/uL — ABNORMAL LOW (ref 150–400)
RBC: 3.27 MIL/uL — AB (ref 4.22–5.81)
RDW: 12.5 % (ref 11.5–15.5)
WBC: 4.3 10*3/uL (ref 4.0–10.5)

## 2018-01-24 LAB — BPAM RBC
BLOOD PRODUCT EXPIRATION DATE: 201912022359
BLOOD PRODUCT EXPIRATION DATE: 201912022359
ISSUE DATE / TIME: 201911210844
ISSUE DATE / TIME: 201911210844
Unit Type and Rh: 5100
Unit Type and Rh: 5100

## 2018-01-24 LAB — I-STAT CG4 LACTIC ACID, ED: Lactic Acid, Venous: 1.8 mmol/L (ref 0.5–1.9)

## 2018-01-24 LAB — ETHANOL: Alcohol, Ethyl (B): <10 mg/dL (ref <10)

## 2018-01-24 LAB — TRIGLYCERIDES: TRIGLYCERIDES: 178 mg/dL — AB (ref <150)

## 2018-01-24 LAB — PREPARE FRESH FROZEN PLASMA
Unit division: 0
Unit division: 0

## 2018-01-24 LAB — MRSA PCR SCREENING: MRSA BY PCR: NEGATIVE

## 2018-01-24 LAB — GLUCOSE, CAPILLARY: Glucose-Capillary: 79 mg/dL (ref 70–99)

## 2018-01-24 LAB — CDS SEROLOGY

## 2018-01-24 MED ORDER — KCL-LACTATED RINGERS-D5W 20 MEQ/L IV SOLN
INTRAVENOUS | Status: DC
  Administered 2018-01-24 – 2018-01-29 (×13): via INTRAVENOUS
  Administered 2018-01-29: 50 mL/h via INTRAVENOUS
  Administered 2018-01-30 – 2018-02-07 (×5): via INTRAVENOUS
  Filled 2018-01-24 (×26): qty 1000

## 2018-01-24 MED ORDER — CHLORHEXIDINE GLUCONATE CLOTH 2 % EX PADS: 6.0000 | MEDICATED_PAD | Freq: Once | CUTANEOUS | Status: DC

## 2018-01-24 MED ORDER — ONDANSETRON 4 MG PO TBDP: 4.0000 mg | ORAL_TABLET | Freq: Four times a day (QID) | ORAL | Status: DC | PRN

## 2018-01-24 MED ORDER — CALCIUM GLUCONATE-NACL 2-0.675 GM/100ML-% IV SOLN
2.0000 g | Freq: Once | INTRAVENOUS | Status: AC
  Administered 2018-01-24: 2000 mg via INTRAVENOUS
  Filled 2018-01-24 (×2): qty 100

## 2018-01-24 MED ORDER — PHENYLEPHRINE HCL-NACL 10-0.9 MG/250ML-% IV SOLN
0.0000 ug/min | INTRAVENOUS | Status: DC
  Administered 2018-01-24: 20 ug/min via INTRAVENOUS
  Administered 2018-01-24: 40 ug/min via INTRAVENOUS
  Administered 2018-01-24: 30 ug/min via INTRAVENOUS
  Administered 2018-01-24: 40 ug/min via INTRAVENOUS
  Administered 2018-01-24: 75 ug/min via INTRAVENOUS
  Administered 2018-01-25: 35 ug/min via INTRAVENOUS
  Administered 2018-01-25: 40 ug/min via INTRAVENOUS
  Administered 2018-01-25: 45 ug/min via INTRAVENOUS
  Administered 2018-01-25: 40 ug/min via INTRAVENOUS
  Administered 2018-01-26: 5 ug/min via INTRAVENOUS
  Administered 2018-01-26: 30 ug/min via INTRAVENOUS
  Filled 2018-01-24 (×11): qty 250

## 2018-01-24 MED ORDER — FENTANYL 2500MCG IN NS 250ML (10MCG/ML) PREMIX INFUSION
25.0000 ug/h | INTRAVENOUS | Status: DC
  Administered 2018-01-24: 50 ug/h via INTRAVENOUS
  Administered 2018-01-25: 150 ug/h via INTRAVENOUS
  Filled 2018-01-24 (×2): qty 250

## 2018-01-24 MED ORDER — SODIUM CHLORIDE 0.9 % IV SOLN: INTRAVENOUS | Status: DC | PRN

## 2018-01-24 MED ORDER — PANTOPRAZOLE SODIUM 40 MG PO TBEC
40.0000 mg | DELAYED_RELEASE_TABLET | Freq: Every day | ORAL | Status: DC
  Administered 2018-01-28 – 2018-02-05 (×5): 40 mg via ORAL
  Filled 2018-01-24 (×7): qty 1

## 2018-01-24 MED ORDER — SODIUM CHLORIDE 0.9 % IV SOLN
INTRAVENOUS | Status: AC | PRN
  Administered 2018-01-24: 1000 mL via INTRAVENOUS

## 2018-01-24 MED ORDER — ONDANSETRON HCL 4 MG/2ML IJ SOLN: 4.0000 mg | Freq: Four times a day (QID) | INTRAMUSCULAR | Status: DC | PRN

## 2018-01-24 MED ORDER — FENTANYL BOLUS VIA INFUSION
25.0000 ug | INTRAVENOUS | Status: DC | PRN
  Administered 2018-01-24: 25 ug via INTRAVENOUS
  Filled 2018-01-24: qty 25

## 2018-01-24 MED ORDER — CHLORHEXIDINE GLUCONATE 0.12% ORAL RINSE (MEDLINE KIT)
15.0000 mL | Freq: Two times a day (BID) | OROMUCOSAL | Status: DC
  Administered 2018-01-24 – 2018-01-25 (×4): 15 mL via OROMUCOSAL

## 2018-01-24 MED ORDER — IOPAMIDOL (ISOVUE-370) INJECTION 76%
100.0000 mL | Freq: Once | INTRAVENOUS | Status: AC | PRN
  Administered 2018-01-24: 100 mL via INTRAVENOUS

## 2018-01-24 MED ORDER — PROPOFOL 1000 MG/100ML IV EMUL
0.0000 ug/kg/min | INTRAVENOUS | Status: DC
  Administered 2018-01-24 (×2): 30 ug/kg/min via INTRAVENOUS
  Administered 2018-01-24: 20 ug/kg/min via INTRAVENOUS
  Administered 2018-01-25: 30 ug/kg/min via INTRAVENOUS
  Filled 2018-01-24 (×4): qty 100

## 2018-01-24 MED ORDER — POTASSIUM CHLORIDE 10 MEQ/100ML IV SOLN
10.0000 meq | INTRAVENOUS | Status: AC
  Administered 2018-01-24 (×5): 10 meq via INTRAVENOUS
  Filled 2018-01-24 (×7): qty 100

## 2018-01-24 MED ORDER — PANTOPRAZOLE SODIUM 40 MG IV SOLR
40.0000 mg | Freq: Every day | INTRAVENOUS | Status: DC
  Administered 2018-01-25 – 2018-01-27 (×3): 40 mg via INTRAVENOUS
  Filled 2018-01-24 (×3): qty 40

## 2018-01-24 MED ORDER — CEFAZOLIN SODIUM-DEXTROSE 2-4 GM/100ML-% IV SOLN: 2.0000 g | INTRAVENOUS | Status: DC

## 2018-01-24 MED ORDER — ETOMIDATE 2 MG/ML IV SOLN
INTRAVENOUS | Status: AC | PRN
  Administered 2018-01-24: 20 mg via INTRAVENOUS

## 2018-01-24 MED ORDER — SUCCINYLCHOLINE CHLORIDE 20 MG/ML IJ SOLN
INTRAMUSCULAR | Status: AC | PRN
  Administered 2018-01-24: 120 mg via INTRAVENOUS

## 2018-01-24 MED ORDER — ORAL CARE MOUTH RINSE
15.0000 mL | OROMUCOSAL | Status: DC
  Administered 2018-01-24 – 2018-01-25 (×13): 15 mL via OROMUCOSAL

## 2018-01-24 NOTE — ED Notes (Signed)
Per Dr. Janee Mornhompson negative FAST exam.
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
## 2018-01-24 NOTE — ED Notes (Signed)
Belongings given to pts friends. (wallet, money, watch, jewelry, dentures, and clothes.)

## 2018-01-24 NOTE — Procedures (Signed)
Arterial Catheter Insertion Procedure Note Karle PlumberDavid Trice [id number] 1940-11-20  Procedure: Insertion of Arterial Catheter  Indications: Blood pressure monitoring  Procedure Details Consent: Risks of procedure as well as the alternatives and risks of each were explained to the (patient/caregiver).  Consent for procedure obtained. Time Out: Verified patient identification, verified procedure, site/side was marked, verified correct patient position, special equipment/implants available, medications/allergies/relevent history reviewed, required imaging and test results available.  Performed  Maximum sterile technique was used including cap, gloves, gown and hand hygiene. Skin prep: Chlorhexidine; local anesthetic administered 20 gauge catheter was inserted into left radial artery using the Seldinger technique.  Evaluation Blood flow good; BP tracing good. Complications: No apparent complications.   Newt LukesGroendal, Angelica Wix Ann 01/24/2018

## 2018-01-24 NOTE — ED Notes (Signed)
Multiple attempts to try to obtained manual BP. Unable to obtain manual BP.
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
## 2018-01-24 NOTE — ED Notes (Signed)
Unable to obtain manual blood pressure reading after multiple attempts on both upper extremities.
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
## 2018-01-24 NOTE — Progress Notes (Signed)
Received pt already in MRI on vent.  S/p MRI, pt transported to 4N via vent w/ no apparent complications, ICU RT aware.
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
## 2018-01-25 ENCOUNTER — Inpatient Hospital Stay (HOSPITAL_COMMUNITY): Payer: Medicare PPO

## 2018-01-25 LAB — CBC
HCT: 36 % — ABNORMAL LOW (ref 39.0–52.0)
Hemoglobin: 11.6 g/dL — ABNORMAL LOW (ref 13.0–17.0)
MCH: 29.7 pg (ref 26.0–34.0)
MCHC: 32.2 g/dL (ref 30.0–36.0)
MCV: 92.1 fL (ref 80.0–100.0)
Platelets: 140 10*3/uL — ABNORMAL LOW (ref 150–400)
RBC: 3.91 MIL/uL — ABNORMAL LOW (ref 4.22–5.81)
RDW: 13.2 % (ref 11.5–15.5)
WBC: 10 10*3/uL (ref 4.0–10.5)
nRBC: 0 % (ref 0.0–0.2)

## 2018-01-25 LAB — BLOOD GAS, ARTERIAL
Acid-base deficit: 2 mmol/L (ref 0.0–2.0)
Bicarbonate: 21.1 mmol/L (ref 20.0–28.0)
FIO2: 30
O2 Saturation: 95.5 %
PEEP: 5 cmH2O
Patient temperature: 98.6
RATE: 18 resp/min
VT: 540 mL
pCO2 arterial: 29 mmHg — ABNORMAL LOW (ref 32.0–48.0)
pH, Arterial: 7.475 — ABNORMAL HIGH (ref 7.350–7.450)
pO2, Arterial: 92.8 mmHg (ref 83.0–108.0)

## 2018-01-25 LAB — BLOOD PRODUCT ORDER (VERBAL) VERIFICATION

## 2018-01-25 MED ORDER — SALINE SPRAY 0.65 % NA SOLN
1.0000 | NASAL | Status: DC | PRN
  Administered 2018-01-25 – 2018-02-01 (×3): 1 via NASAL
  Filled 2018-01-25: qty 44

## 2018-01-25 MED ORDER — MORPHINE SULFATE (PF) 2 MG/ML IV SOLN
2.0000 mg | INTRAVENOUS | Status: DC | PRN
  Administered 2018-01-26 – 2018-01-28 (×5): 2 mg via INTRAVENOUS
  Filled 2018-01-25 (×6): qty 1

## 2018-01-25 MED ORDER — ORAL CARE MOUTH RINSE
15.0000 mL | Freq: Two times a day (BID) | OROMUCOSAL | Status: DC
  Administered 2018-01-25 – 2018-02-05 (×13): 15 mL via OROMUCOSAL

## 2018-01-25 NOTE — Plan of Care (Signed)
Pt extubated to venturi mask at 10AM, copious oral secretions. Protecting airway and saturation good. 175 mL Fentanyl wasted with Tammy B, RN. Pt not experiencing any pain. Will need speech evaluation tomorrow for initiation of nutrition. Will continue to monitor patient.

## 2018-01-25 NOTE — Progress Notes (Signed)
Follow up - Trauma and Critical Care  Patient Details:    [name] [name] is an 77 y.o. male.  Lines/tubes : Airway 8 mm (Active)  Secured at (cm) 25 cm 01/25/2018  7:21 AM  Measured From Lips 01/25/2018  7:21 AM  Secured Location Left 01/25/2018  7:21 AM  Secured By [employer] 01/25/2018  7:21 AM  Tube Holder Repositioned Yes 01/25/2018  7:21 AM  Cuff Pressure (cm H2O) 26 cm H2O 01/24/2018 11:00 PM  Site Condition Dry 01/25/2018  7:21 AM     Arterial Line 01/24/18 Left Radial (Active)  Site Assessment Clean;Dry;Intact 01/24/2018  8:00 PM  Line Status Pulsatile blood flow;Positional 01/24/2018  8:00 PM  Art Line Waveform Appropriate 01/24/2018  8:00 PM  Art Line Interventions Zeroed and calibrated;Leveled 01/24/2018  8:00 PM  Color/Movement/Sensation Capillary refill less than 3 sec 01/24/2018  8:00 PM  Dressing Type Transparent 01/24/2018  8:00 PM  Dressing Status Clean;Dry;Intact 01/24/2018  8:00 PM  Dressing Change Due 01/31/18 01/24/2018  8:00 PM     Urethral Catheter Loretta Plume, NT Temperature probe 16 Fr. (Active)  Indication for Insertion or Continuance of Catheter Unstable critical patients (first 24-48 hours);Unstable spinal/crush injuries 01/24/2018  8:00 PM  Site Assessment Clean;Intact 01/24/2018  8:00 PM  Catheter Maintenance Bag below level of bladder;Catheter secured;Drainage bag/tubing not touching floor;Insertion date on drainage bag;No dependent loops;Bag emptied prior to transport 01/24/2018  8:00 PM  Collection Container Standard drainage bag 01/24/2018  8:00 PM  Securement Method Securing device (Describe) 01/24/2018  8:00 PM  Urinary Catheter Interventions Unclamped 01/24/2018  8:00 PM  Output (mL) 200 mL 01/25/2018  6:00 AM     External Urinary Catheter (Active)    Microbiology/Sepsis markers: Results for orders placed or performed during the hospital encounter of 01/24/18  MRSA PCR Screening     Status: None   Collection Time: 01/24/18 12:08  PM  Result Value Ref Range Status   MRSA by PCR NEGATIVE NEGATIVE Final    Comment:        The GeneXpert MRSA Assay (FDA approved for NASAL specimens only), is one component of a comprehensive MRSA colonization surveillance program. It is not intended to diagnose MRSA infection nor to guide or monitor treatment for MRSA infections. Performed at [facility], [address] [place], [place] [address]     Anti-infectives:  Anti-infectives (From admission, onward)   Start     Dose/Rate Route Frequency Ordered Stop   01/30/18 0630  ceFAZolin (ANCEF) IVPB 2g/100 mL premix     2 g 200 mL/hr over 30 Minutes Intravenous To [facility] 01/24/18 2213 01/31/18 0630      Best Practice/Protocols:  VTE Prophylaxis: Mechanical GI Prophylaxis: Proton Pump Inhibitor Continous Sedation  Consults: Treatment Team:  Md, Trauma, MD Maeola Harman, MD    Events:  Subjective:    Overnight Issues: Propofol and fentanyl  Objective:  Vital signs for last 24 hours: Temp:  [94.5 F (34.7 C)-98.1 F (36.7 C)] 97.7 F (36.5 C) (11/22 0721) Pulse Rate:  [44-92] 48 (11/22 0721) Resp:  [14-39] 18 (11/22 0721) BP: (70-146)/(37-81) 99/51 (11/22 0721) SpO2:  [79 %-100 %] 100 % (11/22 0721) Arterial Line BP: (81-154)/(49-110) 103/50 (11/22 0600) FiO2 (%):  [30 %-100 %] 30 % (11/22 0721) Weight:  [90.7 kg] 90.7 kg (11/21 0853)  Hemodynamic parameters for last 24 hours:    Intake/Output from previous day: 11/21 0701 - 11/22 0700 In: 5958.1 [I.V.:5391.5; IV Piggyback:566.6] Out: 3175 [Urine:3175]  Intake/Output this shift: No intake/output data recorded.  Vent settings for last 24 hours: Vent Mode: PRVC FiO2 (%):  [30 %-100 %] 30 % Set Rate:  [14 bmp-18 bmp] 18 bmp Vt Set:  [540 mL-550 mL] 540 mL PEEP:  [5 cmH20] 5 cmH20 Plateau Pressure:  [15 cmH20-18 cmH20] 18 cmH20  Physical Exam:  General: alert and no respiratory distress Neuro: alert, oriented, nonfocal exam,  RASS 0, weakness right upper extremity, weakness right lower extremity, weakness left upper extremity, weakness left lower extremity, seizures and Up going toes bilaterally.  DTRs blunted in bilateral LE HEENT/Neck: no JVD and ETT WNL  Resp: clear to auscultation bilaterally and weaning on the ventilator CVS: regular rate and rhythm, S1, S2 normal, no murmur, click, rub or gallop GI: Soft and not getting tube feedings. Extremities: edema 1+ and Nol DTRs in bilateral LEs  Results for orders placed or performed during the hospital encounter of 01/24/18 (from the past 24 hour(s))  Prepare fresh frozen plasma     Status: None   Collection Time: 01/24/18  8:43 AM  Result Value Ref Range   Unit Number [id number]    Blood Component Type THAWED PLASMA    Unit division 00    Status of Unit REL FROM [facility]    Unit tag comment EMERGENCY RELEASE    Transfusion Status      OK TO TRANSFUSE Performed at [facility], [address] [place], [place] [address]    Unit Number [id number]    Blood Component Type THAWED PLASMA    Unit division 00    Status of Unit REL FROM [facility]    Unit tag comment EMERGENCY RELEASE    Transfusion Status OK TO TRANSFUSE   Type and screen     Status: None   Collection Time: 01/24/18  8:52 AM  Result Value Ref Range   ABO/RH(D) A POS    Antibody Screen NEG    Sample Expiration      01/27/2018 Performed at [facility], [address] [place], [place] [address]    Unit Number [id number]    Blood Component Type RBC LR PHER2    Unit division 00    Status of Unit REL FROM [facility]    Unit tag comment EMERGENCY RELEASE    Transfusion Status OK TO TRANSFUSE    Crossmatch Result COMPATIBLE    Unit Number [id number]    Blood Component Type RED CELLS,LR    Unit division 00    Status of Unit REL FROM [facility]    Unit tag comment EMERGENCY RELEASE    Transfusion Status OK TO TRANSFUSE    Crossmatch Result COMPATIBLE   ABO/Rh     Status: None    Collection Time: 01/24/18  8:52 AM  Result Value Ref Range   ABO/RH(D)      A POS Performed at [facility], [address], [place], [place] [address]   I-Stat Chem 8, ED     Status: Abnormal   Collection Time: 01/24/18  8:58 AM  Result Value Ref Range   Sodium 142 135 - 145 mmol/L   Potassium 3.4 (L) 3.5 - 5.1 mmol/L   Chloride 113 (H) 98 - 111 mmol/L   BUN 17 8 - 23 mg/dL   Creatinine, Ser 9.56 0.61 - 1.24 mg/dL   Glucose, Bld 387 (H) 70 - 99 mg/dL   Calcium, Ion 5.64 (L) 1.15 - 1.40 mmol/L   TCO2 20 (L) 22 -  32 mmol/L   Hemoglobin 11.6 (L) 13.0 - 17.0 g/dL   HCT 16.1 (L) 09.6 - 04.5 %  I-Stat CG4 Lactic Acid, ED     Status: None   Collection Time: 01/24/18  8:58 AM  Result Value Ref Range   Lactic Acid, Venous 1.80 0.5 - 1.9 mmol/L  Urinalysis, Routine w reflex microscopic     Status: Abnormal   Collection Time: 01/24/18  9:07 AM  Result Value Ref Range   Color, Urine YELLOW YELLOW   APPearance CLEAR CLEAR   Specific Gravity, Urine 1.028 1.005 - 1.030   pH 5.0 5.0 - 8.0   Glucose, UA NEGATIVE NEGATIVE mg/dL   Hgb urine dipstick NEGATIVE NEGATIVE   Bilirubin Urine NEGATIVE NEGATIVE   Ketones, ur NEGATIVE NEGATIVE mg/dL   Protein, ur 30 (A) NEGATIVE mg/dL   Nitrite NEGATIVE NEGATIVE   Leukocytes, UA NEGATIVE NEGATIVE   RBC / HPF 6-10 0 - 5 RBC/hpf   WBC, UA 0-5 0 - 5 WBC/hpf   Bacteria, UA FEW (A) NONE SEEN   Squamous Epithelial / LPF 0-5 0 - 5   Mucus PRESENT    Hyaline Casts, UA PRESENT    Granular Casts, UA PRESENT   CDS serology     Status: None   Collection Time: 01/24/18  9:10 AM  Result Value Ref Range   CDS serology specimen      SPECIMEN WILL BE HELD FOR 14 DAYS IF TESTING IS REQUIRED  Comprehensive metabolic panel     Status: Abnormal   Collection Time: 01/24/18  9:10 AM  Result Value Ref Range   Sodium 142 135 - 145 mmol/L   Potassium 2.5 (LL) 3.5 - 5.1 mmol/L   Chloride 124 (H) 98 - 111 mmol/L   CO2 15 (L) 22 - 32 mmol/L   Glucose, Bld 111  (H) 70 - 99 mg/dL   BUN 13 8 - 23 mg/dL   Creatinine, Ser 4.09 0.61 - 1.24 mg/dL   Calcium 5.2 (LL) 8.9 - 10.3 mg/dL   Total Protein 3.3 (L) 6.5 - 8.1 g/dL   Albumin 2.1 (L) 3.5 - 5.0 g/dL   AST 16 15 - 41 U/L   ALT 17 0 - 44 U/L   Alkaline Phosphatase 39 38 - 126 U/L   Total Bilirubin 0.4 0.3 - 1.2 mg/dL   GFR calc non Af Amer >60 >60 mL/min   GFR calc Af Amer >60 >60 mL/min   Anion gap 3 (L) 5 - 15  CBC     Status: Abnormal   Collection Time: 01/24/18  9:10 AM  Result Value Ref Range   WBC 4.3 4.0 - 10.5 K/uL   RBC 3.27 (L) 4.22 - 5.81 MIL/uL   Hemoglobin 9.8 (L) 13.0 - 17.0 g/dL   HCT 81.1 (L) 91.4 - 78.2 %   MCV 95.1 80.0 - 100.0 fL   MCH 30.0 26.0 - 34.0 pg   MCHC 31.5 30.0 - 36.0 g/dL   RDW 95.6 21.3 - 08.6 %   Platelets 119 (L) 150 - 400 K/uL   nRBC 0.0 0.0 - 0.2 %  Ethanol     Status: None   Collection Time: 01/24/18  9:10 AM  Result Value Ref Range   Alcohol, Ethyl (B) <10 <10 mg/dL  Protime-INR     Status: Abnormal   Collection Time: 01/24/18  9:10 AM  Result Value Ref Range   Prothrombin Time 17.7 (H) 11.4 - 15.2 seconds   INR 1.47   I-Stat  arterial blood gas, ED     Status: Abnormal   Collection Time: 01/24/18  9:54 AM  Result Value Ref Range   pH, Arterial 7.329 (L) 7.350 - 7.450   pCO2 arterial 42.6 32.0 - 48.0 mmHg   pO2, Arterial 459.0 (H) 83.0 - 108.0 mmHg   Bicarbonate 22.7 20.0 - 28.0 mmol/L   TCO2 24 22 - 32 mmol/L   O2 Saturation 100.0 %   Acid-base deficit 3.0 (H) 0.0 - 2.0 mmol/L   Patient temperature 96.3 F    Collection site RADIAL, ALLEN'S TEST ACCEPTABLE    Drawn by Operator    Sample type ARTERIAL   Triglycerides     Status: Abnormal   Collection Time: 01/24/18  9:56 AM  Result Value Ref Range   Triglycerides 178 (H) <150 mg/dL  MRSA PCR Screening     Status: None   Collection Time: 01/24/18 12:08 PM  Result Value Ref Range   MRSA by PCR NEGATIVE NEGATIVE  I-STAT 3, arterial blood gas (G3+)     Status: Abnormal   Collection Time:  01/24/18  2:40 PM  Result Value Ref Range   pH, Arterial 7.478 (H) 7.350 - 7.450   pCO2 arterial 30.6 (L) 32.0 - 48.0 mmHg   pO2, Arterial 115.0 (H) 83.0 - 108.0 mmHg   Bicarbonate 22.6 20.0 - 28.0 mmol/L   TCO2 24 22 - 32 mmol/L   O2 Saturation 99.0 %   Patient temperature HIDE    Collection site ARTERIAL LINE    Sample type ARTERIAL   Glucose, capillary     Status: None   Collection Time: 01/24/18  4:27 PM  Result Value Ref Range   Glucose-Capillary 79 70 - 99 mg/dL   Comment 1 Notify RN    Comment 2 Document in Chart   Blood gas, arterial     Status: Abnormal   Collection Time: 01/25/18  5:25 AM  Result Value Ref Range   FIO2 30.00    Delivery systems VENTILATOR    Mode PRESSURE REGULATED VOLUME CONTROL    VT 540 mL   LHR 18 resp/min   Peep/cpap 5.0 cm H20   pH, Arterial 7.475 (H) 7.350 - 7.450   pCO2 arterial 29.0 (L) 32.0 - 48.0 mmHg   pO2, Arterial 92.8 83.0 - 108.0 mmHg   Bicarbonate 21.1 20.0 - 28.0 mmol/L   Acid-base deficit 2.0 0.0 - 2.0 mmol/L   O2 Saturation 95.5 %   Patient temperature 98.6    Collection site A-LINE    Drawn by ARTERIAL DRAW    Sample type ARTERIAL DRAW    Allens test (pass/fail) PASS PASS  Basic metabolic panel     Status: Abnormal   Collection Time: 01/25/18  6:50 AM  Result Value Ref Range   Sodium 139 135 - 145 mmol/L   Potassium 3.5 3.5 - 5.1 mmol/L   Chloride 114 (H) 98 - 111 mmol/L   CO2 20 (L) 22 - 32 mmol/L   Glucose, Bld 156 (H) 70 - 99 mg/dL   BUN 8 8 - 23 mg/dL   Creatinine, Ser 1.61 0.61 - 1.24 mg/dL   Calcium 8.2 (L) 8.9 - 10.3 mg/dL   GFR calc non Af Amer >60 >60 mL/min   GFR calc Af Amer >60 >60 mL/min   Anion gap 5 5 - 15  CBC     Status: Abnormal   Collection Time: 01/25/18  6:50 AM  Result Value Ref Range   WBC 10.0 4.0 - 10.5  K/uL   RBC 3.91 (L) 4.22 - 5.81 MIL/uL   Hemoglobin 11.6 (L) 13.0 - 17.0 g/dL   HCT 84.136.0 (L) 32.439.0 - 40.152.0 %   MCV 92.1 80.0 - 100.0 fL   MCH 29.7 26.0 - 34.0 pg   MCHC 32.2 30.0 - 36.0  g/dL   RDW 02.713.2 25.311.5 - 66.415.5 %   Platelets 140 (L) 150 - 400 K/uL   nRBC 0.0 0.0 - 0.2 %  Provider-confirm verbal Blood Bank order - RBC, FFP, Type & Screen; 2 Units; Order taken: 01/24/2018; 8:43 AM; Level 1 Trauma, Emergency Release, STAT Two units of uncrossmatched emergency release O positive red blood cells and two units of emerge...     Status: None   Collection Time: 01/25/18  7:49 AM  Result Value Ref Range   Blood product order confirm      MD AUTHORIZATION REQUESTED Performed at [facility], [address] [place], [place] [id number]      Assessment/Plan:   NEURO  Altered Mental Status:  Awake and alert and mildly agitated.   Plan: CPM.  Wean sedation for extubation.  PULM  No specific issues.   Plan: Wean for extubation.  CXR not much different than yesterday.  CARDIO  No specific issues   Plan: CPM  RENAL  No specific issues   Plan: CPM  GI  No specific issues   Plan: CPM  ID  No known infectious source   Plan: CPM  HEME  Anemia acute blood loss anemia)   Plan: Mild anemia not requiring transfusion  ENDO No known issues   Plan: CPM  Global Issues  Wean to extubate.  Will need cervical stabilization next week per Dr. Venetia MaxonStern.    LOS: 1 day   Additional comments:I reviewed the patient's new clinical lab test results. cbc/bmet and I reviewed the patients new imaging test results. CXR  Critical Care Total Time*: 30 Minutes  Jimmye NormanJames Magie Ciampa 01/25/2018  *Care during the described time interval was provided by me and/or other providers on the critical care team.  I have reviewed this patient's available data, including medical history, events of note, physical examination and test results as part of my evaluation.
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
## 2018-01-25 NOTE — Procedures (Signed)
Extubation Procedure Note  Patient Details:   Name: [name] [name] DOB: 24-Feb-1941 MRN: [id number]   Airway Documentation:    Vent end date: 01/25/18 Vent end time: 0958   Evaluation  O2 sats: stable throughout Complications: No apparent complications Patient did tolerate procedure well. Bilateral Breath Sounds: Clear, Diminished   Yes   Pt extubated to 3L [place] per MD order. Pt stable throughout with no complications. VS within normal limits. Positive cuff leak noted prior to extubation. Copious amount of yellow thick secretions suctioned from ETT prior to extubation and copious amounts of yellow thick secretions coughed up post extubation. Pt encouraged to use [place]auer to clear secretions. No stridor noted. I/E wheezes with coarse crackles throughout. RN aware  Carolan ShiverKelley, Avenly Roberge M 01/25/2018, 9:58 AM

## 2018-01-26 LAB — CBC WITH DIFFERENTIAL/PLATELET
Abs Immature Granulocytes: 0.03 10*3/uL (ref 0.00–0.07)
BASOS ABS: 0 10*3/uL (ref 0.0–0.1)
BASOS PCT: 0 %
EOS PCT: 0 %
Eosinophils Absolute: 0 10*3/uL (ref 0.0–0.5)
HCT: 34.1 % — ABNORMAL LOW (ref 39.0–52.0)
HEMOGLOBIN: 10.8 g/dL — AB (ref 13.0–17.0)
Immature Granulocytes: 0 %
LYMPHS PCT: 19 %
Lymphs Abs: 1.7 10*3/uL (ref 0.7–4.0)
MCH: 29.4 pg (ref 26.0–34.0)
MCHC: 31.7 g/dL (ref 30.0–36.0)
MCV: 92.9 fL (ref 80.0–100.0)
MONO ABS: 1 10*3/uL (ref 0.1–1.0)
Monocytes Relative: 12 %
NEUTROS ABS: 5.9 10*3/uL (ref 1.7–7.7)
NRBC: 0 % (ref 0.0–0.2)
Neutrophils Relative %: 69 %
Platelets: 130 10*3/uL — ABNORMAL LOW (ref 150–400)
RBC: 3.67 MIL/uL — AB (ref 4.22–5.81)
RDW: 13 % (ref 11.5–15.5)
WBC: 8.6 10*3/uL (ref 4.0–10.5)

## 2018-01-26 LAB — BASIC METABOLIC PANEL
ANION GAP: 5 (ref 5–15)
Anion gap: 5 (ref 5–15)
BUN: 8 mg/dL (ref 8–23)
BUN: 5 mg/dL — ABNORMAL LOW (ref 8–23)
CALCIUM: 8.1 mg/dL — AB (ref 8.9–10.3)
CO2: 20 mmol/L — ABNORMAL LOW (ref 22–32)
CO2: 24 mmol/L (ref 22–32)
CREATININE: 0.88 mg/dL (ref 0.61–1.24)
Calcium: 8.2 mg/dL — ABNORMAL LOW (ref 8.9–10.3)
Chloride: 114 mmol/L — ABNORMAL HIGH (ref 98–111)
Chloride: 108 mmol/L (ref 98–111)
Creatinine, Ser: 0.79 mg/dL (ref 0.61–1.24)
GFR calc Af Amer: >60 mL/min (ref >60)
GFR calc non Af Amer: >60 mL/min (ref >60)
Glucose, Bld: 156 mg/dL — ABNORMAL HIGH (ref 70–99)
Glucose, Bld: 137 mg/dL — ABNORMAL HIGH (ref 70–99)
POTASSIUM: 3.5 mmol/L (ref 3.5–5.1)
Potassium: 3.8 mmol/L (ref 3.5–5.1)
SODIUM: 139 mmol/L (ref 135–145)
SODIUM: 137 mmol/L (ref 135–145)

## 2018-01-26 MED ORDER — OXYCODONE HCL 5 MG PO TABS
5.0000 mg | ORAL_TABLET | ORAL | Status: DC | PRN
  Administered 2018-01-26 – 2018-01-28 (×7): 10 mg via ORAL
  Filled 2018-01-26 (×7): qty 2

## 2018-01-26 MED ORDER — METHOCARBAMOL 1000 MG/10ML IJ SOLN
500.0000 mg | Freq: Four times a day (QID) | INTRAVENOUS | Status: DC | PRN
  Administered 2018-01-28: 500 mg via INTRAVENOUS
  Filled 2018-01-26 (×2): qty 5

## 2018-01-26 MED ORDER — ACETAMINOPHEN 325 MG PO TABS
650.0000 mg | ORAL_TABLET | Freq: Four times a day (QID) | ORAL | Status: DC
  Administered 2018-01-26 – 2018-01-28 (×9): 650 mg via ORAL
  Filled 2018-01-26 (×9): qty 2

## 2018-01-26 MED ORDER — GABAPENTIN 300 MG PO CAPS
300.0000 mg | ORAL_CAPSULE | Freq: Three times a day (TID) | ORAL | Status: DC
  Administered 2018-01-26 – 2018-01-30 (×11): 300 mg via ORAL
  Filled 2018-01-26 (×13): qty 1

## 2018-01-26 MED ORDER — DOCUSATE SODIUM 100 MG PO CAPS
100.0000 mg | ORAL_CAPSULE | Freq: Two times a day (BID) | ORAL | Status: DC
  Administered 2018-01-26 – 2018-01-30 (×7): 100 mg via ORAL
  Filled 2018-01-26 (×8): qty 1
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
## 2018-01-26 NOTE — Progress Notes (Addendum)
No new issues or problems.  Patient awake and alert.  He is oriented and appropriate.  Improved movement in his right upper extremity.  A little better with his left upper extremity.  Lower extremity motor strength 2/5bilaterally.  Status post C5-6 fracture dislocation with spinal cord injury.  Patient's current alignment satisfactory.  Continue external bracing.  Plan for surgical decompression and fusion middle of next week per Dr. Venetia MaxonStern.
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
## 2018-01-26 NOTE — Progress Notes (Signed)
[facility] Dr. Jordan LikesPool, must remain in C-collar

## 2018-01-27 LAB — CBC
HCT: 33.3 % — ABNORMAL LOW (ref 39.0–52.0)
Hemoglobin: 10.7 g/dL — ABNORMAL LOW (ref 13.0–17.0)
MCH: 30.2 pg (ref 26.0–34.0)
MCHC: 32.1 g/dL (ref 30.0–36.0)
MCV: 94.1 fL (ref 80.0–100.0)
NRBC: 0 % (ref 0.0–0.2)
PLATELETS: 112 10*3/uL — AB (ref 150–400)
RBC: 3.54 MIL/uL — ABNORMAL LOW (ref 4.22–5.81)
RDW: 12.8 % (ref 11.5–15.5)
WBC: 5.6 10*3/uL (ref 4.0–10.5)

## 2018-01-27 MED ORDER — CHLORDIAZEPOXIDE HCL 5 MG PO CAPS
10.0000 mg | ORAL_CAPSULE | Freq: Every day | ORAL | Status: DC
  Administered 2018-01-27 – 2018-02-05 (×7): 10 mg via ORAL
  Filled 2018-01-27 (×9): qty 2

## 2018-01-27 NOTE — Progress Notes (Signed)
Overall situation stable.  Patient's pain well controlled.  Patient continues to regain some function in his upper extremities.  Still with significant sensory loss and weakness in both lower extremities.  Afebrile.  Vital signs are stable.  Patient is awake and alert.  He is oriented and appropriate.  Speech is fluent.  Motor examination of the right upper extremity with good proximal strength in his deltoid and biceps.  He has 4-/5 strength in his right triceps.  He has 3/5 strength in his right grip.  2/5 strength in his right intrinsics.  On the left side the patient has 3/5 strength in his left triceps.  Left wrist extensors 3/5.  Grip 2/5.  Intrinsics 1/5.  Bilateral lower extremity strength 2/5 with some spasticity.  Status post significant C5-6 fracture with incomplete spinal cord injury.  Continue bedrest and cervical collar immobilization.  Plan for operative fixation middle of next week per Dr. Venetia MaxonStern.
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
## 2018-01-28 ENCOUNTER — Inpatient Hospital Stay (HOSPITAL_COMMUNITY): Admission: EM | Disposition: A | Discharge status: Rehabilitation Facility | Payer: Self-pay | Source: Home / Self Care

## 2018-01-28 ENCOUNTER — Inpatient Hospital Stay (HOSPITAL_COMMUNITY): Payer: Medicare PPO | Admitting: Anesthesiology

## 2018-01-28 ENCOUNTER — Inpatient Hospital Stay (HOSPITAL_COMMUNITY): Payer: Medicare PPO

## 2018-01-28 ENCOUNTER — Encounter (HOSPITAL_COMMUNITY): Payer: Self-pay | Admitting: Anesthesiology

## 2018-01-28 DIAGNOSIS — Z981 Arthrodesis status: Secondary | ICD-10-CM

## 2018-01-28 HISTORY — PX: ANTERIOR CERVICAL DECOMP/DISCECTOMY FUSION: SHX1161

## 2018-01-28 LAB — TYPE AND SCREEN
ABO/RH(D): A POS
ANTIBODY SCREEN: NEGATIVE

## 2018-01-28 LAB — CBC
HCT: 32.3 % — ABNORMAL LOW (ref 39.0–52.0)
Hemoglobin: 10.3 g/dL — ABNORMAL LOW (ref 13.0–17.0)
MCH: 29.7 pg (ref 26.0–34.0)
MCHC: 31.9 g/dL (ref 30.0–36.0)
MCV: 93.1 fL (ref 80.0–100.0)
PLATELETS: 134 10*3/uL — AB (ref 150–400)
RBC: 3.47 MIL/uL — ABNORMAL LOW (ref 4.22–5.81)
RDW: 12.5 % (ref 11.5–15.5)
WBC: 5.2 10*3/uL (ref 4.0–10.5)
nRBC: 0 % (ref 0.0–0.2)

## 2018-01-28 SURGERY — ANTERIOR CERVICAL DECOMPRESSION/DISCECTOMY FUSION 1 LEVEL
Anesthesia: General | Site: Neck

## 2018-01-28 MED ORDER — PHENYLEPHRINE 40 MCG/ML (10ML) SYRINGE FOR IV PUSH (FOR BLOOD PRESSURE SUPPORT)
PREFILLED_SYRINGE | INTRAVENOUS | Status: AC
  Filled 2018-01-28: qty 10

## 2018-01-28 MED ORDER — ENSURE ENLIVE PO LIQD
237.0000 mL | Freq: Two times a day (BID) | ORAL | Status: DC
  Administered 2018-01-28 – 2018-01-30 (×3): 237 mL via ORAL

## 2018-01-28 MED ORDER — METHOCARBAMOL 1000 MG/10ML IJ SOLN
500.0000 mg | Freq: Four times a day (QID) | INTRAVENOUS | Status: DC | PRN
  Filled 2018-01-28: qty 5

## 2018-01-28 MED ORDER — DEXAMETHASONE SODIUM PHOSPHATE 10 MG/ML IJ SOLN
INTRAMUSCULAR | Status: DC | PRN
  Administered 2018-01-28: 10 mg via INTRAVENOUS

## 2018-01-28 MED ORDER — DEXAMETHASONE SODIUM PHOSPHATE 4 MG/ML IJ SOLN
4.0000 mg | Freq: Four times a day (QID) | INTRAMUSCULAR | Status: AC
  Administered 2018-01-29 (×4): 4 mg via INTRAVENOUS
  Filled 2018-01-28 (×4): qty 1

## 2018-01-28 MED ORDER — ASPIRIN 81 MG PO CHEW
81.0000 mg | CHEWABLE_TABLET | Freq: Every day | ORAL | Status: DC
  Administered 2018-01-28: 81 mg via ORAL
  Filled 2018-01-28: qty 1

## 2018-01-28 MED ORDER — CEFAZOLIN SODIUM-DEXTROSE 2-3 GM-%(50ML) IV SOLR
INTRAVENOUS | Status: DC | PRN
  Administered 2018-01-28: 2 g via INTRAVENOUS

## 2018-01-28 MED ORDER — THROMBIN 5000 UNITS EX SOLR
CUTANEOUS | Status: DC | PRN
  Administered 2018-01-28 (×2): 5000 [IU] via TOPICAL

## 2018-01-28 MED ORDER — SODIUM CHLORIDE 0.9% FLUSH: 3.0000 mL | INTRAVENOUS | Status: DC | PRN

## 2018-01-28 MED ORDER — HEMOSTATIC AGENTS (NO CHARGE) OPTIME
TOPICAL | Status: DC | PRN
  Administered 2018-01-28: 1 via TOPICAL

## 2018-01-28 MED ORDER — ONDANSETRON HCL 4 MG/2ML IJ SOLN
INTRAMUSCULAR | Status: AC
  Filled 2018-01-28: qty 2

## 2018-01-28 MED ORDER — BUPIVACAINE HCL (PF) 0.25 % IJ SOLN
INTRAMUSCULAR | Status: AC
  Filled 2018-01-28: qty 30

## 2018-01-28 MED ORDER — FENTANYL CITRATE (PF) 250 MCG/5ML IJ SOLN
INTRAMUSCULAR | Status: AC
  Filled 2018-01-28: qty 5

## 2018-01-28 MED ORDER — 0.9 % SODIUM CHLORIDE (POUR BTL) OPTIME
TOPICAL | Status: DC | PRN
  Administered 2018-01-28: 1000 mL

## 2018-01-28 MED ORDER — POTASSIUM CHLORIDE IN NACL 20-0.9 MEQ/L-% IV SOLN: INTRAVENOUS | Status: DC

## 2018-01-28 MED ORDER — LIDOCAINE 2% (20 MG/ML) 5 ML SYRINGE
INTRAMUSCULAR | Status: AC
  Filled 2018-01-28: qty 5

## 2018-01-28 MED ORDER — PROPOFOL 10 MG/ML IV BOLUS
INTRAVENOUS | Status: AC
  Filled 2018-01-28: qty 40

## 2018-01-28 MED ORDER — THROMBIN 5000 UNITS EX SOLR
OROMUCOSAL | Status: DC | PRN
  Administered 2018-01-28: 5 mL via TOPICAL

## 2018-01-28 MED ORDER — METHOCARBAMOL 500 MG PO TABS
500.0000 mg | ORAL_TABLET | Freq: Four times a day (QID) | ORAL | Status: DC | PRN
  Administered 2018-01-29 – 2018-01-30 (×2): 500 mg via ORAL
  Filled 2018-01-28 (×2): qty 1

## 2018-01-28 MED ORDER — FENTANYL CITRATE (PF) 250 MCG/5ML IJ SOLN
INTRAMUSCULAR | Status: DC | PRN
  Administered 2018-01-28 (×2): 50 ug via INTRAVENOUS

## 2018-01-28 MED ORDER — LIDOCAINE HCL (CARDIAC) PF 100 MG/5ML IV SOSY
PREFILLED_SYRINGE | INTRAVENOUS | Status: DC | PRN
  Administered 2018-01-28: 60 mg via INTRATRACHEAL

## 2018-01-28 MED ORDER — SODIUM CHLORIDE 0.9 % IV SOLN
250.0000 mL | INTRAVENOUS | Status: DC
  Administered 2018-01-29: 250 mL via INTRAVENOUS

## 2018-01-28 MED ORDER — ONDANSETRON HCL 4 MG PO TABS: 4.0000 mg | ORAL_TABLET | Freq: Four times a day (QID) | ORAL | Status: DC | PRN

## 2018-01-28 MED ORDER — BUPIVACAINE HCL (PF) 0.25 % IJ SOLN
INTRAMUSCULAR | Status: DC | PRN
  Administered 2018-01-28: 3 mL

## 2018-01-28 MED ORDER — THROMBIN 5000 UNITS EX SOLR
CUTANEOUS | Status: AC
  Filled 2018-01-28: qty 10000

## 2018-01-28 MED ORDER — LACTATED RINGERS IV SOLN: INTRAVENOUS | Status: DC

## 2018-01-28 MED ORDER — PHENOL 1.4 % MT LIQD: 1.0000 | OROMUCOSAL | Status: DC | PRN

## 2018-01-28 MED ORDER — ACETAMINOPHEN 650 MG RE SUPP: 650.0000 mg | RECTAL | Status: DC | PRN

## 2018-01-28 MED ORDER — CEFAZOLIN SODIUM-DEXTROSE 2-4 GM/100ML-% IV SOLN
2.0000 g | Freq: Three times a day (TID) | INTRAVENOUS | Status: AC
  Administered 2018-01-29 (×2): 2 g via INTRAVENOUS
  Filled 2018-01-28 (×2): qty 100

## 2018-01-28 MED ORDER — ONDANSETRON HCL 4 MG/2ML IJ SOLN
INTRAMUSCULAR | Status: DC | PRN
  Administered 2018-01-28: 4 mg via INTRAVENOUS

## 2018-01-28 MED ORDER — LACTATED RINGERS IV SOLN
INTRAVENOUS | Status: DC | PRN
  Administered 2018-01-28: 20:00:00 via INTRAVENOUS

## 2018-01-28 MED ORDER — ONDANSETRON HCL 4 MG/2ML IJ SOLN
4.0000 mg | Freq: Four times a day (QID) | INTRAMUSCULAR | Status: DC | PRN
  Administered 2018-02-03: 4 mg via INTRAVENOUS
  Filled 2018-01-28: qty 2

## 2018-01-28 MED ORDER — SUGAMMADEX SODIUM 500 MG/5ML IV SOLN
INTRAVENOUS | Status: AC
  Filled 2018-01-28: qty 5

## 2018-01-28 MED ORDER — ACETAMINOPHEN 325 MG PO TABS: 650.0000 mg | ORAL_TABLET | ORAL | Status: DC | PRN

## 2018-01-28 MED ORDER — ROCURONIUM BROMIDE 50 MG/5ML IV SOSY
PREFILLED_SYRINGE | INTRAVENOUS | Status: AC
  Filled 2018-01-28: qty 20

## 2018-01-28 MED ORDER — PROPOFOL 10 MG/ML IV BOLUS
INTRAVENOUS | Status: DC | PRN
  Administered 2018-01-28: 80 mg via INTRAVENOUS

## 2018-01-28 MED ORDER — MORPHINE SULFATE (PF) 2 MG/ML IV SOLN
2.0000 mg | INTRAVENOUS | Status: DC | PRN
  Administered 2018-01-29: 2 mg via INTRAVENOUS
  Filled 2018-01-28: qty 1

## 2018-01-28 MED ORDER — SENNA 8.6 MG PO TABS
1.0000 | ORAL_TABLET | Freq: Two times a day (BID) | ORAL | Status: DC
  Administered 2018-01-29 – 2018-01-30 (×3): 8.6 mg via ORAL
  Filled 2018-01-28 (×3): qty 1

## 2018-01-28 MED ORDER — ROCURONIUM BROMIDE 100 MG/10ML IV SOLN
INTRAVENOUS | Status: DC | PRN
  Administered 2018-01-28: 50 mg via INTRAVENOUS

## 2018-01-28 MED ORDER — SUGAMMADEX SODIUM 200 MG/2ML IV SOLN
INTRAVENOUS | Status: DC | PRN
  Administered 2018-01-28: 300 mg via INTRAVENOUS

## 2018-01-28 MED ORDER — MENTHOL 3 MG MT LOZG: 1.0000 | LOZENGE | OROMUCOSAL | Status: DC | PRN

## 2018-01-28 MED ORDER — THROMBIN 5000 UNITS EX SOLR
CUTANEOUS | Status: AC
  Filled 2018-01-28: qty 5000

## 2018-01-28 MED ORDER — SODIUM CHLORIDE 0.9 % IV SOLN
INTRAVENOUS | Status: DC | PRN
  Administered 2018-01-28: 20 ug/min via INTRAVENOUS

## 2018-01-28 MED ORDER — ENOXAPARIN SODIUM 40 MG/0.4ML ~~LOC~~ SOLN: 40.0000 mg | SUBCUTANEOUS | Status: DC

## 2018-01-28 MED ORDER — OXYCODONE HCL 5 MG PO TABS
5.0000 mg | ORAL_TABLET | ORAL | Status: DC | PRN
  Administered 2018-01-29 – 2018-01-30 (×4): 5 mg via ORAL
  Filled 2018-01-28 (×4): qty 1

## 2018-01-28 MED ORDER — SODIUM CHLORIDE 0.9 % IV SOLN
INTRAVENOUS | Status: DC | PRN
  Administered 2018-01-28: 500 mL

## 2018-01-28 MED ORDER — SODIUM CHLORIDE 0.9% FLUSH
3.0000 mL | Freq: Two times a day (BID) | INTRAVENOUS | Status: DC
  Administered 2018-01-29 – 2018-02-05 (×8): 3 mL via INTRAVENOUS

## 2018-01-28 SURGICAL SUPPLY — 57 items
BAG DECANTER FOR FLEXI CONT (MISCELLANEOUS) ×3 IMPLANT
BASKET BONE COLLECTION (BASKET) IMPLANT
BENZOIN TINCTURE PRP APPL 2/3 (GAUZE/BANDAGES/DRESSINGS) ×3 IMPLANT
BIT DRILL 14MM (INSTRUMENTS) ×1 IMPLANT
BUR MATCHSTICK NEURO 3.0 LAGG (BURR) ×3 IMPLANT
CANISTER SUCT 3000ML PPV (MISCELLANEOUS) ×3 IMPLANT
CARTRIDGE OIL MAESTRO DRILL (MISCELLANEOUS) ×1 IMPLANT
CLOSURE STERI-STRIP 1/2X4 (GAUZE/BANDAGES/DRESSINGS) ×1
CLOSURE WOUND 1/2 X4 (GAUZE/BANDAGES/DRESSINGS) ×1
CLSR STERI-STRIP ANTIMIC 1/2X4 (GAUZE/BANDAGES/DRESSINGS) ×2 IMPLANT
COVER WAND RF STERILE (DRAPES) ×3 IMPLANT
DIFFUSER DRILL AIR PNEUMATIC (MISCELLANEOUS) ×3 IMPLANT
DRAPE C-ARM 42X72 X-RAY (DRAPES) ×6 IMPLANT
DRAPE LAPAROTOMY 100X72 PEDS (DRAPES) ×3 IMPLANT
DRAPE MICROSCOPE LEICA (MISCELLANEOUS) ×3 IMPLANT
DRILL 14MM (INSTRUMENTS) ×3
DRSG OPSITE POSTOP 4X6 (GAUZE/BANDAGES/DRESSINGS) ×3 IMPLANT
DURAPREP 6ML APPLICATOR 50/CS (WOUND CARE) ×3 IMPLANT
ELECT COATED BLADE 2.86 ST (ELECTRODE) ×3 IMPLANT
ELECT REM PT RETURN 9FT ADLT (ELECTROSURGICAL) ×3
ELECTRODE REM PT RTRN 9FT ADLT (ELECTROSURGICAL) ×1 IMPLANT
GAUZE 4X4 16PLY RFD (DISPOSABLE) IMPLANT
GLOVE BIO SURGEON STRL SZ7 (GLOVE) ×3 IMPLANT
GLOVE BIO SURGEON STRL SZ8 (GLOVE) ×3 IMPLANT
GLOVE BIOGEL PI IND STRL 7.0 (GLOVE) IMPLANT
GLOVE BIOGEL PI IND STRL 7.5 (GLOVE) ×1 IMPLANT
GLOVE BIOGEL PI INDICATOR 7.0 (GLOVE)
GLOVE BIOGEL PI INDICATOR 7.5 (GLOVE) ×2
GOWN STRL REUS W/ TWL LRG LVL3 (GOWN DISPOSABLE) IMPLANT
GOWN STRL REUS W/ TWL XL LVL3 (GOWN DISPOSABLE) IMPLANT
GOWN STRL REUS W/TWL 2XL LVL3 (GOWN DISPOSABLE) ×3 IMPLANT
GOWN STRL REUS W/TWL LRG LVL3 (GOWN DISPOSABLE)
GOWN STRL REUS W/TWL XL LVL3 (GOWN DISPOSABLE)
HEMOSTAT POWDER KIT SURGIFOAM (HEMOSTASIS) ×3 IMPLANT
KIT BASIN OR (CUSTOM PROCEDURE TRAY) ×3 IMPLANT
KIT TURNOVER KIT B (KITS) ×3 IMPLANT
NEEDLE HYPO 25X1 1.5 SAFETY (NEEDLE) ×3 IMPLANT
NEEDLE SPNL 20GX3.5 QUINCKE YW (NEEDLE) ×3 IMPLANT
NS IRRIG 1000ML POUR BTL (IV SOLUTION) ×3 IMPLANT
OIL CARTRIDGE MAESTRO DRILL (MISCELLANEOUS) ×3
PACK LAMINECTOMY NEURO (CUSTOM PROCEDURE TRAY) ×3 IMPLANT
PAD ARMBOARD 7.5X6 YLW CONV (MISCELLANEOUS) ×3 IMPLANT
PIN DISTRACTION 14MM (PIN) ×6 IMPLANT
PLATE 14MM (Plate) ×3 IMPLANT
PUTTY BONE 1CC ×3 IMPLANT
RUBBERBAND STERILE (MISCELLANEOUS) ×6 IMPLANT
SCREW FA ST 4.0 16 (Screw) ×12 IMPLANT
SLEEVE SURGEON STRL (DRAPES) ×3 IMPLANT
SPACER PTI-C 8X16X14 7DEG (Spacer) ×3 IMPLANT
SPONGE INTESTINAL PEANUT (DISPOSABLE) ×3 IMPLANT
SPONGE SURGIFOAM ABS GEL SZ50 (HEMOSTASIS) ×3 IMPLANT
STRIP CLOSURE SKIN 1/2X4 (GAUZE/BANDAGES/DRESSINGS) ×2 IMPLANT
SUT VIC AB 3-0 SH 8-18 (SUTURE) ×3 IMPLANT
SUT VICRYL 4-0 PS2 18IN ABS (SUTURE) IMPLANT
TOWEL GREEN STERILE (TOWEL DISPOSABLE) ×3 IMPLANT
TOWEL GREEN STERILE FF (TOWEL DISPOSABLE) ×3 IMPLANT
WATER STERILE IRR 1000ML POUR (IV SOLUTION) ×3 IMPLANT
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
## 2018-01-28 NOTE — Progress Notes (Signed)
   01/28/18 1200  Clinical Encounter Type  Visited With Patient and family together  Visit Type Initial;Spiritual support;Other (Comment)  Referral From Nurse  Spiritual Encounters  Spiritual Needs Prayer;Emotional  Stress Factors  Patient Stress Factors Loss of control;Health changes   Responded to call or page referral from care RN reported to be for notarizing AD papers.  When arrived, found that it was HIPPA release and atty contract from pt's atty's ofc.  Explained that we couldn't deal with that as chaplains, but that as long as the pt wanted to sign it, neither form listed a requirement for witnesses or notarization.  Prayed w/ pt and his visitors at pt's request.  Margretta SidleAndrea M Hessie Varone Chaplain resident, [phone]

## 2018-01-28 NOTE — Transfer of Care (Signed)
Immediate Anesthesia Transfer of Care Note  Patient: [name] [name]  Procedure(s) Performed: ANTERIOR CERVICAL DECOMPRESSION/DISCECTOMY FUSION Cervical five - Cervical six (N/A Neck)  Patient Location: PACU  Anesthesia Type:General  Level of Consciousness: drowsy  Airway & Oxygen Therapy: Patient Spontanous Breathing  Post-op Assessment: Report given to RN and Post -op Vital signs reviewed and stable  Post vital signs: Reviewed and stable  Last Vitals:  Vitals Value Taken Time  BP 109/51 01/28/2018 10:32 PM  Temp    Pulse 89 01/28/2018 10:34 PM  Resp 6 01/28/2018 10:34 PM  SpO2 99 % 01/28/2018 10:34 PM  Vitals shown include unvalidated device data.  Last Pain:  Vitals:   01/28/18 2233  TempSrc:   PainSc: (P) 0-No pain      Patients Stated Pain Goal: 2 (01/28/18 1500)  Complications: No apparent anesthesia complications

## 2018-01-28 NOTE — Progress Notes (Signed)
[place] from [place] in PACU on pt. Pt is returning to room 4NP-05  [name] [name]
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
## 2018-01-28 NOTE — Progress Notes (Signed)
Patient states he is having decreased movement in bilateral upper and lower extremities with increased numbness.  Kim with NS advised and STAT MRI ordered.

## 2018-01-28 NOTE — Anesthesia Procedure Notes (Signed)
Procedure Name: Intubation Date/Time: 01/28/2018 9:02 PM Performed by: Claudina LickMahony, Hinda Lindor D, CRNA Pre-anesthesia Checklist: Patient identified, Emergency Drugs available, Suction available, Patient being monitored and Timeout performed Patient Re-evaluated:Patient Re-evaluated prior to induction Oxygen Delivery Method: Circle system utilized Preoxygenation: Pre-oxygenation with 100% oxygen (Head/neck kept in neutral alignment during DL.) Induction Type: IV induction Ventilation: Two handed mask ventilation required and Mask ventilation without difficulty Laryngoscope Size: Glidescope Grade View: Grade I Tube type: Subglottic suction tube Tube size: 7.5 mm Number of attempts: 2 (DL x1- grade 1 view but unable to pass ETT past unopened VC.  Two 2 handed mask ventilation resumed. DL second time with successful placement of ETT. Head/neck kept in neutral position during entire intubation.) Airway Equipment and Method: Stylet and Video-laryngoscopy Placement Confirmation: ETT inserted through vocal cords under direct vision and positive ETCO2 Secured at: 22 cm Tube secured with: Tape Dental Injury: Teeth and Oropharynx as per pre-operative assessment
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
## 2018-01-28 NOTE — Progress Notes (Signed)
Rehab Admissions Coordinator Note:  Patient was screened by Clois DupesBoyette, Kahlie Deutscher Godwin for appropriateness for an Inpatient Acute Rehab Consult per PT recommendation. .  At this time, we are recommending Inpatient Rehab consult postop. Noted for surgery on Wednesday.  Clois DupesBoyette, Rosina Cressler Godwin 01/28/2018, 1:44 PM  I can be reached at [phone].

## 2018-01-28 NOTE — Progress Notes (Signed)
Subjective: Patient reports awake, alert, conversant.  Objective: Vital signs in last 24 hours: Temp:  [97.7 F (36.5 C)-98.7 F (37.1 C)] 98.7 F (37.1 C) (11/25 0400) Pulse Rate:  [57-88] 68 (11/25 0700) Resp:  [4-14] 4 (11/25 0700) BP: (88-138)/(46-90) 106/58 (11/25 0700) SpO2:  [95 %-100 %] 95 % (11/25 0700) Arterial Line BP: (96)/(66) 96/66 (11/24 0900)  Intake/Output from previous day: 11/24 0701 - 11/25 0700 In: 3017.2 [I.V.:2967.2; IV Piggyback:50] Out: 2140 [Urine:2140] Intake/Output this shift: No intake/output data recorded.  Physical Exam: Exam is stable (weak bilateral triceps and hands, 2/5 lower extremities, left more movement than right).  Complains of pain in shoulders (old rotator cuff injuries/surgeries).  Numb in hands, not in legs.  Lab Results: Recent Labs    01/26/18 0502 01/27/18 0923  WBC 8.6 5.6  HGB 10.8* 10.7*  HCT 34.1* 33.3*  PLT 130* 112*   BMET Recent Labs    01/26/18 0502  NA 137  K 3.8  CL 108  CO2 24  GLUCOSE 137*  BUN 5*  CREATININE 0.88  CALCIUM 8.1*    Studies/Results: No results found.  Assessment/Plan: Patient is stable.  Off pressors.  Begin PT/OT.  Surgery scheduled for Wednesday.  I reviewed situation with patient.    LOS: 4 days    Dorian HeckleJoseph D Meital Riehl, MD 01/28/2018, 8:12 AM

## 2018-01-28 NOTE — Anesthesia Preprocedure Evaluation (Addendum)
Anesthesia Evaluation  Patient identified by MRN, date of birth, ID band Patient awake    Reviewed: Allergy & Precautions, H&P , NPO status , Patient's Chart, lab work & pertinent test results  Airway Mallampati: II  TM Distance: >3 FB Neck ROM: Full    Dental no notable dental hx. (+) Edentulous Upper, Edentulous Lower, Dental Advisory Given   Pulmonary neg pulmonary ROS, former smoker,    Pulmonary exam normal breath sounds clear to auscultation       Cardiovascular hypertension, Pt. on medications + CAD, + Past MI and + Cardiac Stents   Rhythm:Regular Rate:Normal     Neuro/Psych negative neurological ROS  negative psych ROS   GI/Hepatic Neg liver ROS, GERD  ,  Endo/Other  negative endocrine ROS  Renal/GU negative Renal ROS  negative genitourinary   Musculoskeletal  (+) Arthritis , Osteoarthritis,    Abdominal   Peds  Hematology negative hematology ROS (+)   Anesthesia Other Findings   Reproductive/Obstetrics negative OB ROS                            Anesthesia Physical Anesthesia Plan  ASA: III and emergent  Anesthesia Plan: General   Post-op Pain Management:    Induction: Intravenous  PONV Risk Score and Plan: 3 and Ondansetron, Dexamethasone and Midazolam  Airway Management Planned: Oral ETT and Video Laryngoscope Planned  Additional Equipment:   Intra-op Plan:   Post-operative Plan: Extubation in OR and Possible Post-op intubation/ventilation  Informed Consent: I have reviewed the patients History and Physical, chart, labs and discussed the procedure including the risks, benefits and alternatives for the proposed anesthesia with the patient or authorized representative who has indicated his/her understanding and acceptance.   Dental advisory given  Plan Discussed with: CRNA, Anesthesiologist and Surgeon  Anesthesia Plan Comments:        Anesthesia Quick  Evaluation

## 2018-01-29 ENCOUNTER — Encounter (HOSPITAL_COMMUNITY): Payer: Self-pay | Admitting: Neurological Surgery

## 2018-01-29 DIAGNOSIS — D62 Acute posthemorrhagic anemia: Secondary | ICD-10-CM

## 2018-01-29 DIAGNOSIS — E785 Hyperlipidemia, unspecified: Secondary | ICD-10-CM

## 2018-01-29 DIAGNOSIS — I2510 Atherosclerotic heart disease of native coronary artery without angina pectoris: Secondary | ICD-10-CM

## 2018-01-29 DIAGNOSIS — I10 Essential (primary) hypertension: Secondary | ICD-10-CM

## 2018-01-29 DIAGNOSIS — R000 Tachycardia, unspecified: Secondary | ICD-10-CM

## 2018-01-29 DIAGNOSIS — S14109A Unspecified injury at unspecified level of cervical spinal cord, initial encounter: Secondary | ICD-10-CM

## 2018-01-29 DIAGNOSIS — G8250 Quadriplegia, unspecified: Secondary | ICD-10-CM

## 2018-01-29 DIAGNOSIS — Z981 Arthrodesis status: Secondary | ICD-10-CM

## 2018-01-29 LAB — CBC
HEMATOCRIT: 34.4 % — AB (ref 39.0–52.0)
Hemoglobin: 11.4 g/dL — ABNORMAL LOW (ref 13.0–17.0)
MCH: 30 pg (ref 26.0–34.0)
MCHC: 33.1 g/dL (ref 30.0–36.0)
MCV: 90.5 fL (ref 80.0–100.0)
Platelets: 162 10*3/uL (ref 150–400)
RBC: 3.8 MIL/uL — ABNORMAL LOW (ref 4.22–5.81)
RDW: 12.2 % (ref 11.5–15.5)
WBC: 4.9 10*3/uL (ref 4.0–10.5)
nRBC: 0 % (ref 0.0–0.2)

## 2018-01-29 LAB — BASIC METABOLIC PANEL
Anion gap: 7 (ref 5–15)
BUN: 12 mg/dL (ref 8–23)
CO2: 29 mmol/L (ref 22–32)
CREATININE: 0.96 mg/dL (ref 0.61–1.24)
Calcium: 8.8 mg/dL — ABNORMAL LOW (ref 8.9–10.3)
Chloride: 102 mmol/L (ref 98–111)
GFR calc Af Amer: >60 mL/min (ref >60)
GFR calc non Af Amer: >60 mL/min (ref >60)
GLUCOSE: 172 mg/dL — AB (ref 70–99)
Potassium: 3.9 mmol/L (ref 3.5–5.1)
Sodium: 138 mmol/L (ref 135–145)

## 2018-01-29 MED ORDER — SODIUM CHLORIDE 0.9 % IV SOLN
INTRAVENOUS | Status: DC | PRN
  Administered 2018-01-29: 1000 mL via INTRAVENOUS

## 2018-01-29 MED FILL — Thrombin For Soln 5000 Unit: CUTANEOUS | Qty: 5000 | Status: AC

## 2018-01-29 MED FILL — Thrombin For Soln 5000 Unit: CUTANEOUS | Qty: 2 | Status: AC

## 2018-01-29 MED FILL — Thrombin (Recombinant) For Soln 5000 Unit: CUTANEOUS | Qty: 5000 | Status: CN
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
## 2018-01-29 NOTE — Anesthesia Postprocedure Evaluation (Signed)
Anesthesia Post Note  Patient: [name] [name]  Procedure(s) Performed: ANTERIOR CERVICAL DECOMPRESSION/DISCECTOMY FUSION Cervical five - Cervical six (N/A Neck)     Patient location during evaluation: PACU Anesthesia Type: General Level of consciousness: awake and alert Pain management: pain level controlled Vital Signs Assessment: post-procedure vital signs reviewed and stable Respiratory status: spontaneous breathing, nonlabored ventilation, respiratory function stable and patient connected to nasal cannula oxygen Cardiovascular status: blood pressure returned to baseline and stable Postop Assessment: no apparent nausea or vomiting Anesthetic complications: no    Last Vitals:  Vitals:   01/29/18 0200 01/29/18 0400  BP: (!) 126/55 (!) 101/55  Pulse: 100 100  Resp: 11 14  Temp:  36.8 C  SpO2: 97% 97%    Last Pain:  Vitals:   01/29/18 0400  TempSrc: Oral  PainSc:                  [name] [name],W. EDMOND

## 2018-01-29 NOTE — Care Management Important Message (Signed)
Important Message  Patient Details  Name: [name] [name] MRN: [id number] Date of Birth: 10/13/1940   Medicare Important Message Given:  Yes    Oralia RudMegan P Aldair Rickel 01/29/2018, 4:47 PM

## 2018-01-29 NOTE — Progress Notes (Addendum)
Patient ID: [name] PlumberDavid Villafuerte, male   DOB: June 22, 1940, 77 y.o.   MRN: [id number] Social visit only to acknowledge change in care team following last night's surgery by Dr. Yetta BarreJones. Pt verbalizes understanding. He is pleased with the improvements he has observed this morning, stating "I can move my legs some now". He also reports improved hand/fingers function.  He remains quite anxious following his neurologic decline yesterday, initially asking about a change in hospitals or change in floors.  Reassured. He will discuss further with Dr. Yetta BarreJones.
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
## 2018-01-30 ENCOUNTER — Encounter (HOSPITAL_COMMUNITY): Admission: EM | Disposition: A | Discharge status: Rehabilitation Facility | Payer: Self-pay | Source: Home / Self Care

## 2018-01-30 SURGERY — ANTERIOR CERVICAL DECOMPRESSION/DISCECTOMY FUSION 1 LEVEL
Anesthesia: General

## 2018-01-30 MED ORDER — HYDROMORPHONE HCL 1 MG/ML IJ SOLN
1.0000 mg | INTRAMUSCULAR | Status: DC | PRN
  Administered 2018-01-30 – 2018-02-04 (×20): 1 mg via INTRAVENOUS
  Filled 2018-01-30 (×21): qty 1

## 2018-01-30 MED ORDER — METHOCARBAMOL 500 MG PO TABS
500.0000 mg | ORAL_TABLET | Freq: Three times a day (TID) | ORAL | Status: DC
  Administered 2018-01-30 – 2018-02-05 (×3): 500 mg via ORAL
  Filled 2018-01-30 (×10): qty 1

## 2018-01-30 MED ORDER — ACETAMINOPHEN 325 MG PO TABS
650.0000 mg | ORAL_TABLET | Freq: Four times a day (QID) | ORAL | Status: DC
  Administered 2018-01-30 (×2): 650 mg via ORAL
  Filled 2018-01-30 (×5): qty 2

## 2018-01-30 MED ORDER — POLYETHYLENE GLYCOL 3350 17 G PO PACK
17.0000 g | PACK | Freq: Every day | ORAL | Status: DC
  Administered 2018-01-31 – 2018-02-01 (×2): 17 g via ORAL
  Filled 2018-01-30 (×5): qty 1

## 2018-01-30 MED ORDER — OXYCODONE HCL 5 MG PO TABS
5.0000 mg | ORAL_TABLET | ORAL | Status: DC | PRN
  Administered 2018-01-30 – 2018-02-03 (×5): 10 mg via ORAL
  Filled 2018-01-30 (×6): qty 2

## 2018-01-30 MED ORDER — ENOXAPARIN SODIUM 40 MG/0.4ML ~~LOC~~ SOLN
40.0000 mg | SUBCUTANEOUS | Status: DC
  Administered 2018-01-30: 40 mg via SUBCUTANEOUS
  Filled 2018-01-30: qty 0.4

## 2018-01-30 NOTE — Progress Notes (Signed)
IP rehab admissions - I met with patient this am.  I also called his sister, Butch Penny, and left a message.  I have called his insurance carrier and have requested acute inpatient rehab admission.  We will not hear back from insurance until at least Friday.  Call me for questions.  [phone]

## 2018-01-30 NOTE — Progress Notes (Deleted)
Pt visited by [facility]; parents present for visit.  Liaison familiar with pt's case as she has reviewed clinical information with pt permission.  After long discussion with pt/family, she relayed to pt family that pt is not a candidate for transfer to [facility] at this time, but may be in the future.  The reasons are that 1) [facility] not a diagnostic [facility], and pt feels he does not have a "diagnosis" for what is wrong with him. 2) Patient is not able to tolerate the 3-5 hours of acute aggressive therapy required at [facility]. 3) Insurance would automatically deny with therapy's recommendation of lower level of care.    She did state that she would follow pt's case, and if he became able to tolerate increased therapy, facility could reevaluate.  Pt and family verbalized understanding of why pt would not be accepted.    Quintella BatonJulie W. Kizzy Olafson, RN, BSN  Trauma/Neuro ICU Case Manager [phone]
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
## 2018-01-30 NOTE — Progress Notes (Addendum)
Patient ID: [name] PlumberDavid [name], male   DOB: [birth year], 77 y.o.   MRN: [id number] Better spirits at present. Moving legs better, with internal rotation at hips and almost 2/5 L HF. No change UE, will start gabapentin for neuropathic pain  NO LOVENOX for 3 more days please

## 2018-01-30 NOTE — Progress Notes (Signed)
Patient ID: [name] PlumberDavid [name], male   DOB: 05-10-1940, 77 y.o.   MRN: [id number] Remains in collar, maybe mild improved strength in R hand as he can lightly hold his suction catheter, wiggles L toes, not much pain other than R index finger which is likely neuropathic, dressing dry. Pt/ot.

## 2018-01-31 MED ORDER — DOCUSATE SODIUM 50 MG/5ML PO LIQD
100.0000 mg | Freq: Two times a day (BID) | ORAL | Status: DC
  Administered 2018-01-31 – 2018-02-05 (×2): 100 mg via ORAL
  Filled 2018-01-31 (×8): qty 10

## 2018-01-31 MED ORDER — MAGIC MOUTHWASH W/LIDOCAINE
5.0000 mL | Freq: Three times a day (TID) | ORAL | Status: DC
  Administered 2018-01-31 – 2018-02-05 (×3): 5 mL via ORAL
  Filled 2018-01-31 (×24): qty 5

## 2018-01-31 MED ORDER — ACETAMINOPHEN 160 MG/5ML PO SOLN
650.0000 mg | Freq: Four times a day (QID) | ORAL | Status: DC
  Administered 2018-02-01 – 2018-02-05 (×2): 650 mg via ORAL
  Filled 2018-01-31 (×8): qty 20.3

## 2018-01-31 MED ORDER — GABAPENTIN 250 MG/5ML PO SOLN
300.0000 mg | Freq: Three times a day (TID) | ORAL | Status: DC
  Filled 2018-01-31 (×25): qty 6

## 2018-01-31 NOTE — Progress Notes (Signed)
Patient had difficulty swallowing his PM medications. One capsule got stuck for an hour in the patient's throat. Attempted to notify on call MD via the answering service but no one answered the phone. Spoke with the pharmacist who will change any meds that could be given in the liquid version.

## 2018-01-31 NOTE — Progress Notes (Addendum)
Patient continues to be difficult refusing PO meds and talking condsending to staff. Patient states he wants to stand in the room and then sit on the side of the bed and then he is unable to move his right leg at all. Any attempt to hel p patient he becomes agitated. Patient refused Tylenol with temp 101.1.  1127 patient states he want's to raise his head up in the bed and only wants his pain meds by IV and will not take his po pain med including Acetaminophen solution temp 99.1.

## 2018-01-31 NOTE — Progress Notes (Signed)
Trauma Service Note  Subjective: Overall in better spirits.  Say he has burning in his throat with eating.  Objective: Vital signs in last 24 hours: Temp:  [97.5 F (36.4 C)-98.9 F (37.2 C)] 97.5 F (36.4 C) (11/28 0750) Pulse Rate:  [78-96] 86 (11/28 0800) Resp:  [9-17] 12 (11/28 0800) BP: (112-123)/(60-82) 117/60 (11/28 0750) SpO2:  [98 %] 98 % (11/28 0800) Last BM Date: (PTA)  Intake/Output from previous day: 11/27 0701 - 11/28 0700 In: 488 [I.V.:488] Out: 2650 [Urine:2650] Intake/Output this shift: Total I/O In: 754.3 [I.V.:754.3] Out: -   General: No distress  Lungs: Clear  Abd: Benign.  Good bowel sounds.  Not much of an appetite.  Extremities: NO changes.  Weak bilaterally  Neuro: No signnificant improvement in neurlogical status  Lab Results: CBC  Recent Labs    01/29/18 0553  WBC 4.9  HGB 11.4*  HCT 34.4*  PLT 162   BMET Recent Labs    01/29/18 0553  NA 138  K 3.9  CL 102  CO2 29  GLUCOSE 172*  BUN 12  CREATININE 0.96  CALCIUM 8.8*   PT/INR No results for input(s): LABPROT, INR in the last 72 hours. ABG No results for input(s): PHART, HCO3 in the last 72 hours.  Invalid input(s): PCO2, PO2  Studies/Results: No results found.  Anti-infectives: Anti-infectives (From admission, onward)   Start     Dose/Rate Route Frequency Ordered Stop   01/30/18 0630  ceFAZolin (ANCEF) IVPB 2g/100 mL premix  Status:  Discontinued     2 g 200 mL/hr over 30 Minutes Intravenous To ShortStay Surgical 01/24/18 2213 01/28/18 2358   01/29/18 0300  ceFAZolin (ANCEF) IVPB 2g/100 mL premix     2 g 200 mL/hr over 30 Minutes Intravenous Every 8 hours 01/28/18 2359 01/29/18 1132   01/28/18 2125  bacitracin 50,000 Units in sodium chloride 0.9 % 500 mL irrigation  Status:  Discontinued       As needed 01/28/18 2125 01/28/18 2225      Assessment/Plan: s/p Procedure(s): ANTERIOR CERVICAL DECOMPRESSION/DISCECTOMY FUSION Cervical five - Cervical six Magic  mouthwash for burning throat.  LOS: 7 days   Marta LamasJames O. Gae BonWyatt, III, MD, FACS [phone] Trauma Surgeon 01/31/2018
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
## 2018-02-01 DIAGNOSIS — S14109D Unspecified injury at unspecified level of cervical spinal cord, subsequent encounter: Secondary | ICD-10-CM

## 2018-02-01 MED ORDER — PHENOL 1.4 % MT LIQD
1.0000 | OROMUCOSAL | Status: DC | PRN
  Filled 2018-02-01: qty 177

## 2018-02-01 MED ORDER — ENOXAPARIN SODIUM 40 MG/0.4ML ~~LOC~~ SOLN: 40.0000 mg | SUBCUTANEOUS | Status: DC
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
## 2018-02-01 NOTE — Progress Notes (Signed)
Patient ID: [name] PlumberDavid [name], male   DOB: Jul 16, 1940, 77 y.o.   MRN: [id number] Patient doing well,  pain controlled  increased movement lower extremities l>r Incision C/D/I  PT/OT, continue C-Collar

## 2018-02-01 NOTE — Progress Notes (Signed)
 PHYSICAL MEDICINE & REHABILITATION PROGRESS NOTE  Subjective/Complaints: Patient seen sitting up in bed.  He states he is doing better with some improvement in LUE strength.    ROS: Denies CP, SOB, N/V/D.  Objective: Vital Signs: Blood pressure 105/68, pulse 96, temperature 97.6 F (36.4 C), temperature source Oral, resp. rate 12, height 5\' 8"  (1.727 m), weight 90.7 kg, SpO2 95 %. No results found. No results for input(s): WBC, HGB, HCT, PLT in the last 72 hours. No results for input(s): NA, K, CL, CO2, GLUCOSE, BUN, CREATININE, CALCIUM in the last 72 hours.  Physical Exam: BP 105/68 (BP Location: Right Arm)   Pulse 96   Temp 97.6 F (36.4 C) (Oral)   Resp 12   Ht 5\' 8"  (1.727 m)   Wt 90.7 kg   SpO2 95%   BMI 30.41 kg/m  Constitutional: No distress . Vital signs reviewed. HENT: Right sided edema.  Staples intact. Eyes: EOMI. No discharge. Cardiovascular: RRR. No JVD. Respiratory: CTA Bilaterally. Normal effort. GI: BS +. Non-distended. Musc: No edema or tenderness in extremities. Neurology: Alert Motor: Right upper extremity: Shoulder abduction, elbow flexion 4/5, distally 1/5 Left upper extremity: Shoulder abduction, elbow flexion 4/5, distally 3/5 Bilateral lower extremity: 0/5 proximally, wiggles toes Sensation diminished to light touch bilateral upper and bilateral lower extremities   Assessment/Plan:  1. Myelopathy with tetraparesis  Cont therapies  Cont to await tolerance and insurance approval for CIR  2. ABLA  Hb 11.4 on 11/26  Cont to monitor  A FACE TO FACE EVALUATION WAS PERFORMED  Ankit Karis Jubanil Patel 02/01/2018, 9:20 PM
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
## 2018-02-01 NOTE — Progress Notes (Signed)
Patient refused his PO medications. Patient stated that his mouth and throat are burning. Chloraseptic spray ordered and patient refused it. Will continue to monitor patient.

## 2018-02-01 NOTE — Progress Notes (Signed)
Patient did better through the shift at the end with receiving IV pain meds. Patient was able to relax more and much more pleasant to the staff.

## 2018-02-01 NOTE — Progress Notes (Signed)
Inpatient Rehabilitation-Admissions Coordinator   Noted pt has not tolerated OOB activity at this time. Will continue to monitor for progress with therapies. Will follow up Monday.   Please call if questions.   Nanine MeansKelly Shameeka Silliman, OTR/L  Rehab Admissions Coordinator  [phone] 02/01/2018 11:59 AM

## 2018-02-02 LAB — BASIC METABOLIC PANEL
ANION GAP: 11 (ref 5–15)
BUN: 19 mg/dL (ref 8–23)
CO2: 26 mmol/L (ref 22–32)
Calcium: 8.8 mg/dL — ABNORMAL LOW (ref 8.9–10.3)
Chloride: 95 mmol/L — ABNORMAL LOW (ref 98–111)
Creatinine, Ser: 0.8 mg/dL (ref 0.61–1.24)
GFR calc Af Amer: >60 mL/min (ref >60)
GFR calc non Af Amer: >60 mL/min (ref >60)
Glucose, Bld: 141 mg/dL — ABNORMAL HIGH (ref 70–99)
POTASSIUM: 4.2 mmol/L (ref 3.5–5.1)
Sodium: 132 mmol/L — ABNORMAL LOW (ref 135–145)

## 2018-02-02 LAB — CBC
HCT: 35.2 % — ABNORMAL LOW (ref 39.0–52.0)
Hemoglobin: 11.6 g/dL — ABNORMAL LOW (ref 13.0–17.0)
MCH: 29.6 pg (ref 26.0–34.0)
MCHC: 33 g/dL (ref 30.0–36.0)
MCV: 89.8 fL (ref 80.0–100.0)
NRBC: 0 % (ref 0.0–0.2)
Platelets: 217 10*3/uL (ref 150–400)
RBC: 3.92 MIL/uL — ABNORMAL LOW (ref 4.22–5.81)
RDW: 12.2 % (ref 11.5–15.5)
WBC: 7.1 10*3/uL (ref 4.0–10.5)

## 2018-02-02 MED ORDER — SENNA 8.6 MG PO TABS
1.0000 | ORAL_TABLET | Freq: Two times a day (BID) | ORAL | Status: DC
  Administered 2018-02-05: 8.6 mg via ORAL
  Filled 2018-02-02 (×3): qty 1

## 2018-02-02 MED ORDER — BISACODYL 10 MG RE SUPP
10.0000 mg | Freq: Every day | RECTAL | Status: DC | PRN
  Administered 2018-02-03 – 2018-02-04 (×2): 10 mg via RECTAL
  Filled 2018-02-02 (×2): qty 1

## 2018-02-02 NOTE — Progress Notes (Signed)
Patient ID: [name] PlumberDavid [name], male   DOB: 1940/12/16, 77 y.o.   MRN: [id number] Vital signs are stable Patient is complaining of pain in his neck his head shoulders and down to his toes Current medication regimen appears to be adequate Incisions are clean dry.  Motor function in right upper extremities fair though no intrinsic hand function is noted biceps and triceps are 3 out of 5 he has some flexion trace in the distal lower extremities noted to be at 2 out of 5. Continue supportive care and mobilization
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
## 2018-02-02 NOTE — Progress Notes (Signed)
RN continuing attempts at pt education for need of medication, reposition, nutrition. Pt gets agitated and will not listen. Pt continuing to refuse medications except for "liquid pain medication that goes through my IV" and will occasionally take oral pain medication though refuses all other oral medication stating "I can't, it chokes me." RN attempts to help pt feed at meal times though pt does not want food or takes one small bite then states "I'm done." RN to continue to educate and encourage pt with plan of care.

## 2018-02-03 ENCOUNTER — Inpatient Hospital Stay (HOSPITAL_COMMUNITY): Payer: Medicare PPO

## 2018-02-03 MED ORDER — IOHEXOL 300 MG/ML  SOLN
75.0000 mL | Freq: Once | INTRAMUSCULAR | Status: AC | PRN
  Administered 2018-02-03: 75 mL via INTRAVENOUS

## 2018-02-03 NOTE — Progress Notes (Signed)
Patient ID: [name] PlumberDavid [name], male   DOB: September 11, 1940, 77 y.o.   MRN: [id number] Patient was noted to have some gagging episodes yesterday.  They have been concerned about the possibility of silent aspiration.  CT scan of the neck shows substantial prevertebral edema however no shift of the airway or the esophagus from one side to the other.  This looks like typical postoperative findings.  Patient is currently n.p.o. and will have speech evaluate him in regards to his swallowing function.  His motor function again remained stable with his right arm and leg doing better than his left side he does not have wrist extensor function on the left but he does on the right which allows him to manipulate the right arm much better.  Extensive rehab will be necessary for him.
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
## 2018-02-04 ENCOUNTER — Other Ambulatory Visit: Payer: Self-pay

## 2018-02-04 ENCOUNTER — Inpatient Hospital Stay (HOSPITAL_COMMUNITY): Payer: Medicare PPO

## 2018-02-04 MED ORDER — HYDROMORPHONE HCL 1 MG/ML IJ SOLN
1.0000 mg | Freq: Four times a day (QID) | INTRAMUSCULAR | Status: DC | PRN
  Administered 2018-02-04 – 2018-02-06 (×3): 1 mg via INTRAVENOUS
  Filled 2018-02-04 (×3): qty 1

## 2018-02-04 MED ORDER — BOOST / RESOURCE BREEZE PO LIQD CUSTOM: 1.0000 | Freq: Two times a day (BID) | ORAL | Status: DC

## 2018-02-04 MED ORDER — ASPIRIN 81 MG PO CHEW
81.0000 mg | CHEWABLE_TABLET | Freq: Once | ORAL | Status: DC
  Filled 2018-02-04: qty 1

## 2018-02-04 MED ORDER — OXYCODONE HCL 5 MG PO TABS
10.0000 mg | ORAL_TABLET | ORAL | Status: DC | PRN
  Administered 2018-02-04 – 2018-02-05 (×2): 10 mg via ORAL
  Filled 2018-02-04 (×2): qty 2
  Filled 2018-02-04: qty 3
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
## 2018-02-04 NOTE — Progress Notes (Signed)
IP rehab admissions - I have approval for acute inpatient rehab admission.  Patient has not been out of bed up in chair as far as I can see from therapy notes.  I will follow up again in am for progress.  Call me for questions.  [phone]

## 2018-02-04 NOTE — Progress Notes (Signed)
NEUROSURGERY PROGRESS NOTE  Doing ok. Neurologically stable. He expresses that he is ready to "get out of here" and wants to go to rehab. No issues over night  Temp:  [97.3 F (36.3 C)-98.8 F (37.1 C)] 97.3 F (36.3 C) (12/02 0740) Pulse Rate:  [83-93] 92 (12/02 0740) Resp:  [11-16] 13 (12/02 0740) BP: (108-132)/(53-69) 109/69 (12/02 0740) SpO2:  [95 %-100 %] 100 % (12/02 0740)  Plan: Awaiting insurance approval for rehab. Ok to be d/c to rehab from our standpoint when bed available.   Sherryl MangesKimberly Hannah Roselyne Stalnaker, NP 02/04/2018 8:34 AM

## 2018-02-04 NOTE — Progress Notes (Signed)
Modified Barium Swallow Progress Note  Patient Details  Name: [name] [name] MRN: [id number] Date of Birth: February 09, 1941  Today's Date: 02/04/2018  Modified Barium Swallow completed.  Full report located under Chart Review in the Imaging Section.  Brief recommendations include the following:  Clinical Impression  Pt demonstrates mild oropharyngeal dysphagia secondary to appearance of edematous posterior pharyngeal wall impeding epiglottic deflection and possibly complete hyolaryngeal excursion and UES opening. Pt fully protects the airway with prolonged laryngeal elevation and airway closure during multiple swallows to clear the majority of residue in pyriforms and above UES. A mild quantity of vallcular residue remains and with solids, liquid wash is helpful to clear out valleculae. Pt is able to thoroughly masticate and so would not restrict chewable solid textures desptie deficits. Whole pulls however are very likely to lodge in the valleculae so crushed meds are recommended when possible. Will initaite a dys 3 mech soft diet given pts inabitliy to cut up his food and also with the hope that food may be a little more moist. Pt should follow bites with sips and have total assist with meals. Will follow to reinforce precautions.    Swallow Evaluation Recommendations       SLP Diet Recommendations: Dysphagia 3 (Mech soft) solids;Thin liquid       Medication Administration: Crushed with puree   Supervision: Full assist for feeding   Compensations: Slow rate;Small sips/bites;Follow solids with liquid;Multiple dry swallows after each bite/sip   Postural Changes: Remain semi-upright after after feeds/meals (Comment);Seated upright at 90 degrees   Oral Care Recommendations: Oral care BID       Harlon DittyBonnie Viha Kriegel, MA CCC-SLP  Acute Rehabilitation Services Pager [phone] Office [phone]  [name] [name], [name] NearingBonnie [name] 02/04/2018,10:32 AM

## 2018-02-05 MED ORDER — ENOXAPARIN SODIUM 40 MG/0.4ML ~~LOC~~ SOLN
40.0000 mg | SUBCUTANEOUS | Status: DC
  Administered 2018-02-05 – 2018-02-06 (×2): 40 mg via SUBCUTANEOUS
  Filled 2018-02-05 (×3): qty 0.4
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
## 2018-02-05 NOTE — Progress Notes (Signed)
Later Afternoon Progress Note  Pt fearful of first day OOB without PT assisting return to the bed.    02/05/18 1852  PT Visit Information  Last PT Received On 02/05/18  Assistance Needed +2  Subjective Data  Patient Stated Goal walk and go home  Precautions  Precautions Fall;Cervical  Cervical Brace At all times;Hard collar  Pain Assessment  Pain Assessment Faces  Faces Pain Scale 6  Pain Location neck  Pain Descriptors / Indicators Aching;Discomfort;Guarding  Pain Intervention(s) Monitored during session  Cognition  Arousal/Alertness Awake/alert  Behavior During Therapy WFL for tasks assessed/performed  Overall Cognitive Status Impaired/Different from baseline  Bed Mobility  Overal bed mobility Needs Assistance  Bed Mobility Sit to Supine  Sit to supine Total assist;+2 for physical assistance  Transfers  Overall transfer level Needs assistance  Transfers Lateral/Scoot Transfers   Lateral/Scoot Transfers Total assist;+2 physical assistance;+2 safety/equipment  General transfer comment lateral scoot of equal height from recliner to the bed.  Balance  Sitting balance-Leahy Scale Poor  Standing balance comment unable  PT - End of Session  Equipment Utilized During Treatment Cervical collar  Activity Tolerance Patient tolerated treatment well  Patient left in bed;with call bell/phone within reach;with bed alarm set;with SCD's reapplied  Nurse Communication Mobility status;Need for lift equipment;Precautions   PT - Assessment/Plan  PT Plan Current plan remains appropriate  PT Visit Diagnosis Muscle weakness (generalized) (M62.81);Other abnormalities of gait and mobility (R26.89);Other symptoms and signs involving the nervous system (R29.898)  PT Frequency (ACUTE ONLY) Min 3X/week  Follow Up Recommendations CIR;Supervision/Assistance - 24 hour  PT equipment Wheelchair (measurements PT);Wheelchair cushion (measurements PT);Hospital bed  AM-PAC PT "6 Clicks" Mobility Outcome  Measure (Version 2)  Help needed turning from your back to your side while in a flat bed without using bedrails? 1  Help needed moving from lying on your back to sitting on the side of a flat bed without using bedrails? 1  Help needed moving to and from a bed to a chair (including a wheelchair)? 1  Help needed standing up from a chair using your arms (e.g., wheelchair or bedside chair)? 1  Help needed to walk in hospital room? 1  Help needed climbing 3-5 steps with a railing?  1  6 Click Score 6  Consider Recommendation of Discharge To: CIR/SNF/LTACH  PT Goal Progression  Progress towards PT goals Progressing toward goals  Acute Rehab PT Goals  PT Goal Formulation With patient  Time For Goal Achievement 02/11/18  Potential to Achieve Goals Fair  PT Time Calculation  PT Start Time (ACUTE ONLY) 1410  PT Stop Time (ACUTE ONLY) 1430  PT Time Calculation (min) (ACUTE ONLY) 20 min  PT General Charges  $$ ACUTE PT VISIT 1 Visit  PT Treatments  $Therapeutic Activity 8-22 mins   02/05/2018  [place] BingKen Braxon Suder, PT Acute Rehabilitation Services [phone]  (pager) [phone]  (office)
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
## 2018-02-05 NOTE — Progress Notes (Signed)
Subjective: Patient reports he's "ready to get out of this bed." No acute events overnight Objective: Vital signs in last 24 hours: Temp:  [98 F (36.7 C)-98.6 F (37 C)] 98 F (36.7 C) (12/03 0800) Pulse Rate:  [77-84] 77 (12/03 0800) Resp:  [9-20] 11 (12/03 0800) BP: (98-122)/(52-61) 107/52 (12/03 0800) SpO2:  [92 %-99 %] 92 % (12/03 0800)  Intake/Output from previous day: 12/02 0701 - 12/03 0700 In: 535.8 [P.O.:100; I.V.:435.8] Out: 2425 [Urine:2425] Intake/Output this shift: No intake/output data recorded.  Neurological: strength unchanged.  Lab Results: Lab Results  Component Value Date   WBC 7.1 02/02/2018   HGB 11.6 (L) 02/02/2018   HCT 35.2 (L) 02/02/2018   MCV 89.8 02/02/2018   PLT 217 02/02/2018   Lab Results  Component Value Date   INR 1.47 01/24/2018   BMET Lab Results  Component Value Date   NA 132 (L) 02/02/2018   K 4.2 02/02/2018   CL 95 (L) 02/02/2018   CO2 26 02/02/2018   GLUCOSE 141 (H) 02/02/2018   BUN 19 02/02/2018   CREATININE 0.80 02/02/2018   CALCIUM 8.8 (L) 02/02/2018    Studies/Results: No results found.  Assessment/Plan: Rehab has approval by insurance. Would like therapies to work with him today so that he can be transferred to rehab as he is very anxious to get there.    LOS: 12 days    [name] 02/05/2018, 8:37 AM

## 2018-02-06 LAB — CBC
HCT: 35.1 % — ABNORMAL LOW (ref 39.0–52.0)
Hemoglobin: 11.5 g/dL — ABNORMAL LOW (ref 13.0–17.0)
MCH: 29.2 pg (ref 26.0–34.0)
MCHC: 32.8 g/dL (ref 30.0–36.0)
MCV: 89.1 fL (ref 80.0–100.0)
NRBC: 0 % (ref 0.0–0.2)
Platelets: 181 10*3/uL (ref 150–400)
RBC: 3.94 MIL/uL — ABNORMAL LOW (ref 4.22–5.81)
RDW: 12 % (ref 11.5–15.5)
WBC: 5.8 10*3/uL (ref 4.0–10.5)

## 2018-02-06 LAB — BASIC METABOLIC PANEL
ANION GAP: 11 (ref 5–15)
BUN: 14 mg/dL (ref 8–23)
CO2: 27 mmol/L (ref 22–32)
Calcium: 9 mg/dL (ref 8.9–10.3)
Chloride: 98 mmol/L (ref 98–111)
Creatinine, Ser: 0.78 mg/dL (ref 0.61–1.24)
GFR calc Af Amer: >60 mL/min (ref >60)
GFR calc non Af Amer: >60 mL/min (ref >60)
Glucose, Bld: 127 mg/dL — ABNORMAL HIGH (ref 70–99)
Potassium: 3.9 mmol/L (ref 3.5–5.1)
Sodium: 136 mmol/L (ref 135–145)

## 2018-02-06 MED ORDER — BETHANECHOL CHLORIDE 10 MG PO TABS
10.0000 mg | ORAL_TABLET | Freq: Three times a day (TID) | ORAL | Status: DC
  Filled 2018-02-06: qty 1

## 2018-02-06 MED ORDER — OXYCODONE HCL 5 MG PO TABS
5.0000 mg | ORAL_TABLET | ORAL | Status: DC | PRN
  Administered 2018-02-07: 10 mg via ORAL
  Filled 2018-02-06: qty 2
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
## 2018-02-06 NOTE — Progress Notes (Signed)
Patient has refused all medications all day.  He as also only eaten one bite of oatmeal this morning for [place], [place].  Dr Lindie SpruceWyatt is aware of patient's refusal.  Patient said he will eat and take his medications when he gets to rehab. Spoke with Genie with CIR who said that insurance had to be resubmitted but it is likely that patient will be admitted to CIR tomorrow, 12-5. Patient continues to be delirious off & on throughout the day but is easily redirected and was compliant when with working with PT in transfer to chair and is anticipating transfer to rehab. Ebone Alcivar C
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
## 2018-02-07 ENCOUNTER — Encounter (HOSPITAL_COMMUNITY): Payer: Self-pay

## 2018-02-07 ENCOUNTER — Inpatient Hospital Stay (HOSPITAL_COMMUNITY)
Admission: RE | Admit: 2018-02-07 | Discharge: 2018-02-19 | DRG: 559 | Disposition: A | Discharge status: Skilled Nursing Facility | Payer: Medicare PPO | Source: Intra-hospital | Attending: Physical Medicine & Rehabilitation | Admitting: Physical Medicine & Rehabilitation

## 2018-02-07 ENCOUNTER — Other Ambulatory Visit: Payer: Self-pay

## 2018-02-07 DIAGNOSIS — A499 Bacterial infection, unspecified: Secondary | ICD-10-CM | POA: Diagnosis not present

## 2018-02-07 DIAGNOSIS — I252 Old myocardial infarction: Secondary | ICD-10-CM

## 2018-02-07 DIAGNOSIS — G8250 Quadriplegia, unspecified: Secondary | ICD-10-CM | POA: Diagnosis not present

## 2018-02-07 DIAGNOSIS — Z4789 Encounter for other orthopedic aftercare: Secondary | ICD-10-CM | POA: Diagnosis not present

## 2018-02-07 DIAGNOSIS — R609 Edema, unspecified: Secondary | ICD-10-CM | POA: Diagnosis not present

## 2018-02-07 DIAGNOSIS — G959 Disease of spinal cord, unspecified: Secondary | ICD-10-CM | POA: Diagnosis present

## 2018-02-07 DIAGNOSIS — S12500D Unspecified displaced fracture of sixth cervical vertebra, subsequent encounter for fracture with routine healing: Secondary | ICD-10-CM

## 2018-02-07 DIAGNOSIS — M5000 Cervical disc disorder with myelopathy, unspecified cervical region: Secondary | ICD-10-CM | POA: Diagnosis not present

## 2018-02-07 DIAGNOSIS — E876 Hypokalemia: Secondary | ICD-10-CM | POA: Diagnosis present

## 2018-02-07 DIAGNOSIS — Z7982 Long term (current) use of aspirin: Secondary | ICD-10-CM | POA: Diagnosis not present

## 2018-02-07 DIAGNOSIS — G250 Essential tremor: Secondary | ICD-10-CM | POA: Diagnosis present

## 2018-02-07 DIAGNOSIS — K921 Melena: Secondary | ICD-10-CM | POA: Diagnosis not present

## 2018-02-07 DIAGNOSIS — B9620 Unspecified Escherichia coli [E. coli] as the cause of diseases classified elsewhere: Secondary | ICD-10-CM | POA: Diagnosis present

## 2018-02-07 DIAGNOSIS — N390 Urinary tract infection, site not specified: Secondary | ICD-10-CM | POA: Diagnosis present

## 2018-02-07 DIAGNOSIS — S161XXA Strain of muscle, fascia and tendon at neck level, initial encounter: Secondary | ICD-10-CM | POA: Diagnosis not present

## 2018-02-07 DIAGNOSIS — N319 Neuromuscular dysfunction of bladder, unspecified: Secondary | ICD-10-CM | POA: Diagnosis not present

## 2018-02-07 DIAGNOSIS — Z79899 Other long term (current) drug therapy: Secondary | ICD-10-CM

## 2018-02-07 DIAGNOSIS — G992 Myelopathy in diseases classified elsewhere: Secondary | ICD-10-CM | POA: Diagnosis not present

## 2018-02-07 DIAGNOSIS — S14106S Unspecified injury at C6 level of cervical spinal cord, sequela: Secondary | ICD-10-CM | POA: Diagnosis not present

## 2018-02-07 DIAGNOSIS — Z87891 Personal history of nicotine dependence: Secondary | ICD-10-CM | POA: Diagnosis not present

## 2018-02-07 DIAGNOSIS — I10 Essential (primary) hypertension: Secondary | ICD-10-CM | POA: Diagnosis present

## 2018-02-07 DIAGNOSIS — K5900 Constipation, unspecified: Secondary | ICD-10-CM | POA: Diagnosis not present

## 2018-02-07 DIAGNOSIS — Z955 Presence of coronary angioplasty implant and graft: Secondary | ICD-10-CM | POA: Diagnosis not present

## 2018-02-07 DIAGNOSIS — S0101XS Laceration without foreign body of scalp, sequela: Secondary | ICD-10-CM | POA: Diagnosis not present

## 2018-02-07 DIAGNOSIS — I951 Orthostatic hypotension: Secondary | ICD-10-CM | POA: Diagnosis present

## 2018-02-07 DIAGNOSIS — Z96653 Presence of artificial knee joint, bilateral: Secondary | ICD-10-CM | POA: Diagnosis present

## 2018-02-07 DIAGNOSIS — M4712 Other spondylosis with myelopathy, cervical region: Secondary | ICD-10-CM | POA: Diagnosis not present

## 2018-02-07 DIAGNOSIS — S12591D Other nondisplaced fracture of sixth cervical vertebra, subsequent encounter for fracture with routine healing: Secondary | ICD-10-CM | POA: Diagnosis not present

## 2018-02-07 DIAGNOSIS — S14109D Unspecified injury at unspecified level of cervical spinal cord, subsequent encounter: Secondary | ICD-10-CM | POA: Diagnosis not present

## 2018-02-07 DIAGNOSIS — S14156A Other incomplete lesion at C6 level of cervical spinal cord, initial encounter: Secondary | ICD-10-CM

## 2018-02-07 DIAGNOSIS — K592 Neurogenic bowel, not elsewhere classified: Secondary | ICD-10-CM | POA: Diagnosis present

## 2018-02-07 DIAGNOSIS — F4322 Adjustment disorder with anxiety: Secondary | ICD-10-CM

## 2018-02-07 DIAGNOSIS — E785 Hyperlipidemia, unspecified: Secondary | ICD-10-CM | POA: Diagnosis present

## 2018-02-07 DIAGNOSIS — I2510 Atherosclerotic heart disease of native coronary artery without angina pectoris: Secondary | ICD-10-CM | POA: Diagnosis not present

## 2018-02-07 MED ORDER — SENNA 8.6 MG PO TABS
1.0000 | ORAL_TABLET | Freq: Two times a day (BID) | ORAL | Status: DC
  Administered 2018-02-09: 8.6 mg via ORAL
  Filled 2018-02-07 (×3): qty 1

## 2018-02-07 MED ORDER — POLYETHYLENE GLYCOL 3350 17 G PO PACK
17.0000 g | PACK | Freq: Every day | ORAL | Status: DC
  Administered 2018-02-08: 17 g via ORAL
  Filled 2018-02-07 (×3): qty 1

## 2018-02-07 MED ORDER — BISACODYL 10 MG RE SUPP: 10.0000 mg | Freq: Every day | RECTAL | Status: DC | PRN

## 2018-02-07 MED ORDER — SALINE SPRAY 0.65 % NA SOLN
1.0000 | NASAL | Status: DC | PRN
  Administered 2018-02-09 – 2018-02-17 (×7): 1 via NASAL
  Filled 2018-02-07 (×2): qty 44

## 2018-02-07 MED ORDER — ONDANSETRON HCL 4 MG PO TABS: 4.0000 mg | ORAL_TABLET | Freq: Four times a day (QID) | ORAL | Status: DC | PRN

## 2018-02-07 MED ORDER — ACETAMINOPHEN 160 MG/5ML PO SOLN
650.0000 mg | Freq: Four times a day (QID) | ORAL | Status: DC
  Administered 2018-02-08 – 2018-02-11 (×2): 650 mg via ORAL
  Filled 2018-02-07 (×6): qty 20.3

## 2018-02-07 MED ORDER — METHOCARBAMOL 500 MG PO TABS
500.0000 mg | ORAL_TABLET | Freq: Three times a day (TID) | ORAL | Status: DC
  Administered 2018-02-09 – 2018-02-19 (×26): 500 mg via ORAL
  Filled 2018-02-07 (×29): qty 1

## 2018-02-07 MED ORDER — PANTOPRAZOLE SODIUM 40 MG PO TBEC
40.0000 mg | DELAYED_RELEASE_TABLET | Freq: Every day | ORAL | Status: DC
  Administered 2018-02-12 – 2018-02-19 (×6): 40 mg via ORAL
  Filled 2018-02-07 (×11): qty 1

## 2018-02-07 MED ORDER — SORBITOL 70 % SOLN
30.0000 mL | Freq: Every day | Status: DC | PRN
  Administered 2018-02-09: 30 mL via ORAL
  Filled 2018-02-07: qty 30

## 2018-02-07 MED ORDER — CHLORDIAZEPOXIDE HCL 5 MG PO CAPS
10.0000 mg | ORAL_CAPSULE | Freq: Every day | ORAL | Status: DC
  Administered 2018-02-08 – 2018-02-19 (×12): 10 mg via ORAL
  Filled 2018-02-07 (×12): qty 2

## 2018-02-07 MED ORDER — OXYCODONE HCL 5 MG PO TABS
5.0000 mg | ORAL_TABLET | ORAL | Status: DC | PRN
  Administered 2018-02-07 – 2018-02-19 (×33): 10 mg via ORAL
  Filled 2018-02-07 (×33): qty 2

## 2018-02-07 MED ORDER — GABAPENTIN 250 MG/5ML PO SOLN
300.0000 mg | Freq: Three times a day (TID) | ORAL | Status: DC
  Administered 2018-02-08 – 2018-02-18 (×23): 300 mg via ORAL
  Filled 2018-02-07 (×39): qty 6

## 2018-02-07 MED ORDER — ENOXAPARIN SODIUM 40 MG/0.4ML ~~LOC~~ SOLN
40.0000 mg | SUBCUTANEOUS | Status: DC
  Administered 2018-02-08 – 2018-02-18 (×11): 40 mg via SUBCUTANEOUS
  Filled 2018-02-07 (×11): qty 0.4

## 2018-02-07 MED ORDER — BETHANECHOL CHLORIDE 10 MG PO TABS
10.0000 mg | ORAL_TABLET | Freq: Three times a day (TID) | ORAL | Status: DC
  Administered 2018-02-08 – 2018-02-10 (×6): 10 mg via ORAL
  Filled 2018-02-07 (×6): qty 1

## 2018-02-07 MED ORDER — ENOXAPARIN SODIUM 40 MG/0.4ML ~~LOC~~ SOLN: 40.0000 mg | SUBCUTANEOUS | Status: DC

## 2018-02-07 MED ORDER — ONDANSETRON HCL 4 MG/2ML IJ SOLN: 4.0000 mg | Freq: Four times a day (QID) | INTRAMUSCULAR | Status: DC | PRN

## 2018-02-07 NOTE — Progress Notes (Signed)
Transferring to [phone]. Report given to [facility] Mabe LPN via phone. Pt aware of transfer. Belongings gathered, VSS. Lines intact. SL IV in R wrist. Neuro status stable as earlier today. Dressing and Aspen collar intact. Gabriel CirriBarbie Dez Stauffer RN

## 2018-02-07 NOTE — Progress Notes (Signed)
Received report from 4N nurse. Pt will be admitted to 4M12.   Ross LudwigKAYLA M Celese Banner, LPN

## 2018-02-07 NOTE — Progress Notes (Signed)
IP rehab admissions - I await insurance authorization for possible acute inpatient rehab admission.  I faxed clinicals and opened the case for the second time yesterday.  We lost out [facility] because [facility] could not participate fully with therapies.  I spoke with patient's POA Coralee Northina today to update her.  I will update all once I hear back from insurance carrier.  Call me for questions.  [phone]
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
## 2018-02-07 NOTE — Progress Notes (Signed)
Pt arrived to unit via bed. O2 2L/[place]. Aspen collar in place. No c/o pain at present. A&O x4. Oriented to room and call bell in place and side rails up x 4. Will cont to monitor.   Ross LudwigKAYLA M Clydie Dillen, LPN
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
## 2018-02-08 ENCOUNTER — Inpatient Hospital Stay (HOSPITAL_COMMUNITY): Payer: Medicare PPO | Admitting: Speech Pathology

## 2018-02-08 ENCOUNTER — Inpatient Hospital Stay (HOSPITAL_COMMUNITY): Payer: Medicare PPO

## 2018-02-08 ENCOUNTER — Inpatient Hospital Stay (HOSPITAL_COMMUNITY): Payer: Medicare PPO | Admitting: Occupational Therapy

## 2018-02-08 DIAGNOSIS — R609 Edema, unspecified: Secondary | ICD-10-CM

## 2018-02-08 LAB — CBC WITH DIFFERENTIAL/PLATELET
Abs Immature Granulocytes: 0.04 10*3/uL (ref 0.00–0.07)
Basophils Absolute: 0 10*3/uL (ref 0.0–0.1)
Basophils Relative: 0 %
Eosinophils Absolute: 0.1 10*3/uL (ref 0.0–0.5)
Eosinophils Relative: 1 %
HCT: 41.2 % (ref 39.0–52.0)
Hemoglobin: 12.8 g/dL — ABNORMAL LOW (ref 13.0–17.0)
Immature Granulocytes: 1 %
Lymphocytes Relative: 19 %
Lymphs Abs: 1.3 10*3/uL (ref 0.7–4.0)
MCH: 28.3 pg (ref 26.0–34.0)
MCHC: 31.1 g/dL (ref 30.0–36.0)
MCV: 90.9 fL (ref 80.0–100.0)
Monocytes Absolute: 1 10*3/uL (ref 0.1–1.0)
Monocytes Relative: 14 %
Neutro Abs: 4.4 10*3/uL (ref 1.7–7.7)
Neutrophils Relative %: 65 %
Platelets: 370 10*3/uL (ref 150–400)
RBC: 4.53 MIL/uL (ref 4.22–5.81)
RDW: 12.4 % (ref 11.5–15.5)
WBC: 6.7 10*3/uL (ref 4.0–10.5)
nRBC: 0 % (ref 0.0–0.2)

## 2018-02-08 LAB — COMPREHENSIVE METABOLIC PANEL
ALT: 48 U/L — ABNORMAL HIGH (ref 0–44)
AST: 24 U/L (ref 15–41)
Albumin: 3.2 g/dL — ABNORMAL LOW (ref 3.5–5.0)
Alkaline Phosphatase: 70 U/L (ref 38–126)
Anion gap: 13 (ref 5–15)
BUN: 18 mg/dL (ref 8–23)
CHLORIDE: 100 mmol/L (ref 98–111)
CO2: 26 mmol/L (ref 22–32)
CREATININE: 0.95 mg/dL (ref 0.61–1.24)
Calcium: 9.2 mg/dL (ref 8.9–10.3)
GFR calc Af Amer: >60 mL/min (ref >60)
GFR calc non Af Amer: >60 mL/min (ref >60)
Glucose, Bld: 109 mg/dL — ABNORMAL HIGH (ref 70–99)
Potassium: 3.9 mmol/L (ref 3.5–5.1)
Sodium: 139 mmol/L (ref 135–145)
Total Bilirubin: 0.9 mg/dL (ref 0.3–1.2)
Total Protein: 6.1 g/dL — ABNORMAL LOW (ref 6.5–8.1)

## 2018-02-08 MED ORDER — LIDOCAINE HCL URETHRAL/MUCOSAL 2 % EX GEL
1.0000 "application " | CUTANEOUS | Status: DC | PRN
  Filled 2018-02-08: qty 5

## 2018-02-08 MED ORDER — BENEPROTEIN PO POWD
1.0000 | Freq: Two times a day (BID) | ORAL | Status: DC
  Administered 2018-02-08 – 2018-02-19 (×11): 6 g via ORAL
  Filled 2018-02-08: qty 227

## 2018-02-08 NOTE — Plan of Care (Signed)
  Problem: RH SKIN INTEGRITY Goal: RH STG SKIN FREE OF INFECTION/BREAKDOWN Outcome: Progressing Goal: RH STG MAINTAIN SKIN INTEGRITY WITH ASSISTANCE Description STG Maintain Skin Integrity With Assistance. Outcome: Progressing   Problem: RH SAFETY Goal: RH STG ADHERE TO SAFETY PRECAUTIONS W/ASSISTANCE/DEVICE Description STG Adhere to Safety Precautions With Assistance/Device. Outcome: Progressing Goal: RH STG DECREASED RISK OF FALL WITH ASSISTANCE Description STG Decreased Risk of Fall With Assistance. Outcome: Progressing   Problem: RH PAIN MANAGEMENT Goal: RH STG PAIN MANAGED AT OR BELOW PT'S PAIN GOAL Outcome: Progressing   Problem: RH KNOWLEDGE DEFICIT GENERAL Goal: RH STG INCREASE KNOWLEDGE OF SELF CARE AFTER HOSPITALIZATION Outcome: Progressing
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
## 2018-02-08 NOTE — Progress Notes (Signed)
Inpatient Rehabilitation  Patient information reviewed and entered into eRehab system by Kedric Bumgarner M. Dusten Ellinwood, M.A., CCC/SLP, PPS Coordinator.  Information including medical coding, functional ability and quality indicators will be reviewed and updated through discharge.    Per nursing patient was given "Data Collection Information Summary" for Patients in Inpatient Rehabilitation Facilities with attached "Privacy Act Statement-Health Care Records" upon admission.   
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
## 2018-02-08 NOTE — Discharge Instructions (Signed)
Inpatient Rehab Discharge Instructions  [name] PlumberDavid [name] Discharge date and time: No discharge date for patient encounter.   Activities/Precautions/ Functional Status: Activity: cervical collar as directed Diet: mechanical soft Wound Care: keep wound clean and dry Functional status:  ___ No restrictions     ___ Walk up steps independently ___ 24/7 supervision/assistance   ___ Walk up steps with assistance ___ Intermittent supervision/assistance  ___ Bathe/dress independently ___ Walk with walker     _x__ Bathe/dress with assistance ___ Walk Independently    ___ Shower independently ___ Walk with assistance    ___ Shower with assistance ___ No alcohol     ___ Return to work/school ________  Special Instructions:  No driving  My questions have been answered and I understand these instructions. I will adhere to these goals and the provided educational materials after my discharge from the hospital.  Patient/Caregiver Signature _______________________________ Date __________  Clinician Signature _______________________________________ Date __________  Please bring this form and your medication list with you to all your follow-up doctor's appointments.
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
## 2018-02-08 NOTE — Progress Notes (Signed)
Bilateral lower extremity venous duplex completed. There is no evidence of a DVT or Baker's cyst. [name] DeitersVirginia Eusebia [name], RVS 12/6 2019, 4:40 PM

## 2018-02-08 NOTE — IPOC Note (Addendum)
Overall Plan of Care [facility]) Patient Details Name: Masato Pettie MRN: [id number] DOB: Nov 29, 1940  Admitting Diagnosis: <principal problem not specified>cervical myelopathy/TBI  Hospital Problems: Active Problems:   Myelopathy [facility])     Functional Problem List: Nursing Bladder, Bowel, Pain, Medication Management  PT Balance, Endurance, Motor, Perception, Safety, Sensory, Pain  OT Balance, Cognition, Endurance, Motor, Pain, Perception, Safety, Sensory, Skin Integrity  SLP Cognition, Nutrition  TR         Basic ADL's: OT Eating, Grooming, Bathing, Dressing, Toileting     Advanced  ADL's: OT       Transfers: PT Bed Mobility, Bed to Chair, Car, [employer], Floor  OT Toilet     Locomotion: PT Ambulation, Psychologist, prison and probation services, Stairs     Additional Impairments: OT Fuctional Use of Upper Extremity  SLP Swallowing, Social Cognition   Memory, Attention, Problem Solving  TR      Anticipated Outcomes Item Anticipated Outcome  Self Feeding Min assist  Swallowing  supervision   Basic self-care  Mod assist  Toileting  Mod assist   Bathroom Transfers Mod assist  Bowel/Bladder  Patient will regain normal bowel pattern and urinary function.  Transfers  Mod A  Locomotion  Mod A at w/c level  Communication     Cognition  supervision  Pain  Patient will remain at pain level <3 on scale of 1-10.   Safety/Judgment  Pt will continue to exhibit appropriate judgement and maintain compliance with safety interventions. No falls during hospitalization.    Therapy Plan: PT Intensity: Minimum of 1-2 x/day ,45 to 90 minutes PT Frequency: 5 out of 7 days PT Duration Estimated Length of Stay: 24-28 days OT Intensity: Minimum of 1-2 x/day, 45 to 90 minutes OT Frequency: 5 out of 7 days OT Duration/Estimated Length of Stay: 4 weeks SLP Intensity: Minumum of 1-2 x/day, 30 to 90 minutes SLP Frequency: 3 to 5 out of 7 days SLP Duration/Estimated Length of Stay: 28 days     Team  Interventions: Nursing Interventions Patient/Family Education, Disease Management/Prevention, Skin Care/Wound Management, Bladder Management, Pain Management, Psychosocial Support, Bowel Management, Medication Management  PT interventions Ambulation/gait training, Balance/vestibular training, Cognitive remediation/compensation, Community reintegration, Discharge planning, Disease management/prevention, DME/adaptive equipment instruction, Functional electrical stimulation, Functional mobility training, Neuromuscular re-education, Pain management, Patient/family education, Psychosocial support, Splinting/orthotics, Therapeutic Activities, Therapeutic Exercise, UE/LE Strength taining/ROM, UE/LE Coordination activities, Wheelchair propulsion/positioning  OT Interventions Balance/vestibular training, Cognitive remediation/compensation, Discharge planning, DME/adaptive equipment instruction, Functional mobility training, Neuromuscular re-education, Pain management, Psychosocial support, Patient/family education, Self Care/advanced ADL retraining, Skin care/wound managment, Splinting/orthotics, Therapeutic Activities, Therapeutic Exercise, UE/LE Strength taining/ROM, UE/LE Coordination activities, Wheelchair propulsion/positioning  SLP Interventions Cognitive remediation/compensation, Cueing hierarchy, Dysphagia/aspiration precaution training, Environmental controls, Internal/external aids, Patient/family education  TR Interventions    SW/CM Interventions Discharge Planning, Psychosocial Support, Patient/Family Education   Barriers to Discharge MD  Medical stability  Nursing Medication compliance, Neurogenic Bowel & Bladder    PT Inaccessible home environment, Decreased caregiver support, Medical stability, Home environment access/layout, Neurogenic Bowel & Bladder, Insurance for SNF coverage    OT Decreased caregiver support    SLP Decreased caregiver support    SW       Team Discharge  Planning: Destination: PT-Home ,OT- Home vs Skilled Nursing Facility (SNF) , SLP-Home Projected Follow-up: PT-Home health PT, 24 hour supervision/assistance, OT-  24 hour supervision/assistance, Home health OT, Skilled nursing facility(HHOT vs SNF), SLP-Skilled Nursing facility Projected Equipment Needs: PT-Wheelchair (measurements), Wheelchair cushion (measurements), To be determined, OT- Sliding board, Tub/shower bench, 3 in  1 bedside comode, SLP-None recommended by SLP Equipment Details: PT-TBD pending progress, OT-  Patient/family involved in discharge planning: PT- Patient,  OT-Patient, SLP-Patient  MD ELOS: 23-27 days Medical Rehab Prognosis:  Excellent Assessment: The patient has been admitted for CIR therapies with the diagnosis of cervical fracture with C6 SCI, TBI. The team will be addressing functional mobility, strength, stamina, balance, safety, adaptive techniques and equipment, self-care, bowel and bladder mgt, patient and caregiver education, NMR, spinal precautions, pain mgt, cognitive perceptual awareness, orthotics . Goals have been set at max assist for self-care and mobility and supervision to mod I with cognition.    Ranelle OysterZachary T. Swartz, MD, FAAPMR      See Team Conference Notes for weekly updates to the plan of care
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
## 2018-02-08 NOTE — Progress Notes (Signed)
Foley catheter discontinued at 0610.
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
## 2018-02-09 ENCOUNTER — Inpatient Hospital Stay (HOSPITAL_COMMUNITY): Payer: Medicare PPO | Admitting: Speech Pathology

## 2018-02-09 ENCOUNTER — Inpatient Hospital Stay (HOSPITAL_COMMUNITY): Payer: Medicare PPO | Admitting: Occupational Therapy

## 2018-02-09 ENCOUNTER — Inpatient Hospital Stay (HOSPITAL_COMMUNITY): Payer: Medicare PPO | Admitting: Physical Therapy

## 2018-02-09 DIAGNOSIS — S14106S Unspecified injury at C6 level of cervical spinal cord, sequela: Secondary | ICD-10-CM
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
## 2018-02-09 NOTE — Progress Notes (Signed)
Speech Language Pathology Daily Session Note  Patient Details  Name: Karle PlumberDavid Macconnell MRN: [id number] Date of Birth: May 01, 1940  Today's Date: 02/09/2018 SLP Individual Time: 1300-1400 SLP Individual Time Calculation (min): 60 min  Short Term Goals: Week 1: SLP Short Term Goal 1 (Week 1): Pt will consume trials of regular textures and thin liquids with supervision cues for use of swallowing precautions SLP Short Term Goal 2 (Week 1): Pt will recall milldy complex, daily information with min cues for use of external aids.    Skilled Therapeutic Interventions:   SLP received pt in bed with [place] bedside d/t coughin up phelgm. However pt with difficulty coughing d/t SCI and despite attempts was not able to move phelgm with cough. Sips of water were helpful in swallow phelgm. Pt consumed without overt s/s of aspiration. Pt perseverates on needing to cough up phelgm and is self-directing in his care with easy frustration when SLP attempted to execute requests. Pt resistant to SLP suggestions of where to place clock etc. Very little progress towards SLP directed tasks within session. Pt left in bed, bed alarm on and soft touch button in place on pt's chest.       Pain Pain Assessment Pain Scale: Faces Faces Pain Scale: No hurt  Therapy/Group: Individual Therapy  Christpoher Sievers 02/09/2018, 1:58 PM

## 2018-02-09 NOTE — Progress Notes (Signed)
Occupational Therapy Session Note  Patient Details  Name: [name] [name] MRN: [id number] Date of Birth: 27-Jan-1941  Today's Date: 02/09/2018 OT Individual Time: 0920-1000 OT Individual Time Calculation (min): 40 min   Short Term Goals: Week 1:  OT Short Term Goal 1 (Week 1): Pt will utilize wrist support/u-cuff with mod assist to increase participation in self-feeding OT Short Term Goal 2 (Week 1): Pt will complete bed mobility with max assist of 1 caregiver to decrease burden of care with bathing/dressing OT Short Term Goal 3 (Week 1): Pt will obtain circle sitting in bed with mod assist to participate in self-care tasks OT Short Term Goal 4 (Week 1): Pt will complete slide board transfer bed <> w/c to prepare for transfers to toilet with max assist of 1 caregiver  Skilled Therapeutic Interventions/Progress Updates:    Pt greeted in TIS, adamant that he wanted to return to bed due to buttocks pain. Tried reclining TIS to address, however pt reported this did not help. RN made aware of his pain. 3 assist slideboard transfer completed back to bed due to uphill slope and difficultly scooting without pants (though we did use pillowcase over board). Once in reclined position in bed, pt engaged in active assist UE ROM exercises, per request. Manual assist for maximizing supination stretch bilaterally and for extending fingers. He reported stretches made his hands feel better. Pt requested NMR activities that he could complete in bed, and OT provided him with multiple sensory methods he could utilize, and also advised squeezing/releasing bedrails and foam sponges. Education emphasis placed on releasing to strengthen extensor musculature. At end of session 2 assist required for boosting up/repositioning in bed for comfort. Pt left with soft call bell in hospital gown pocket. All needs met.    Therapy Documentation Precautions:  Precautions Precautions: Fall, Cervical Precaution Comments: Reviewed  cervical precautions Required Braces or Orthoses: Cervical Brace Cervical Brace: At all times, Hard collar Restrictions Weight Bearing Restrictions: No Pain: Pt reported pain in buttocks subsided once back in bed   ADL: ADL Eating: Dependent Grooming: Dependent Upper Body Bathing: Dependent Where Assessed-Upper Body Bathing: Bed level Lower Body Bathing: Dependent Where Assessed-Lower Body Bathing: Bed level Upper Body Dressing: Dependent Where Assessed-Upper Body Dressing: Bed level Lower Body Dressing: Dependent Where Assessed-Lower Body Dressing: Bed level Toileting: Dependent Where Assessed-Toileting: Bed level      Therapy/Group: Individual Therapy  Delonda Coley A Kimberly Nieland 02/09/2018, 12:32 PM

## 2018-02-09 NOTE — Progress Notes (Signed)
Physical Therapy Session Note  Patient Details  Name: [name] [name] MRN: [id number] Date of Birth: 01-18-1941  Today's Date: 02/09/2018 PT Individual Time: 0800-0858  PT Individual Time Calculation (min): 58 min   Short Term Goals: Week 1:  PT Short Term Goal 1 (Week 1): Pt will complete rolling L/R with assist x 1 PT Short Term Goal 2 (Week 1): Pt will maintain sitting balance EOB with max A x 1 PT Short Term Goal 3 (Week 1): Pt will tolerate sitting OOB in chair x 1 hour  Skilled Therapeutic Interventions/Progress Updates:   Session 1:  Pt in supine and agreeable to therapy, denies pain at rest. Donned TEDs and socks w/ total assist prior to transferring to EOB w/ total assist and verbal/tactile cues for technique, weight-shifting, and for neck precautions. Maintained static sitting w/ total assist while 2nd helper set-up w/c for transfer. Increased neck pain w/ unsupported sitting, did not rate and resolves after supported in chair. SB transfer to TIS, total assist x2, verbal and manual cues for technique to utilize UEs as much as possible. Total assist x2 to reposition in chair for pressure relief multiple times throughout session. Oriented pt to rehab unit in w/c, pt eager to get out of room. Performed Kinetron at lowest resistance w/ total assist from therapist to work on LE ROM and muscle activation, 2 min x3 reps. Tactile cues for quad activation, trace quad felt L>R. Added gait belt around knees to keep LEs in neutral alignment while at rest in chair. Returned to room and ended session in TIS and in care of NT, all needs met.   Session 2:  Pt missed 30 min of skilled PT 2/2 nursing care and pain/fatigue. Pt being cathed and refused further activity afterward. Pt pleasant, but grimacing w/ all movement and feels he "over did it" today.   Therapy Documentation Precautions:  Precautions Precautions: Fall, Cervical Precaution Comments: Reviewed cervical precautions Required Braces or  Orthoses: Cervical Brace Cervical Brace: At all times, Hard collar Restrictions Weight Bearing Restrictions: No Pain: Pain Assessment Pain Scale: 0-10 Pain Score: Asleep Pain Type: Acute pain Pain Location: Generalized(neck, arms, legs, shoulders) Pain Orientation: Anterior;Left;Right Pain Descriptors / Indicators: Aching Pain Frequency: Intermittent Pain Onset: On-going Pain Intervention(s): Medication (See eMAR)  Therapy/Group: Individual Therapy  Kenzleigh Sedam K Honi Name 02/09/2018, 9:11 AM

## 2018-02-10 MED ORDER — POLYETHYLENE GLYCOL 3350 17 G PO PACK
17.0000 g | PACK | Freq: Two times a day (BID) | ORAL | Status: DC
  Administered 2018-02-14 – 2018-02-16 (×3): 17 g via ORAL
  Filled 2018-02-10 (×17): qty 1

## 2018-02-10 MED ORDER — FLUTICASONE PROPIONATE 50 MCG/ACT NA SUSP
1.0000 | Freq: Every day | NASAL | Status: DC
  Administered 2018-02-10 – 2018-02-19 (×10): 1 via NASAL
  Filled 2018-02-10: qty 16

## 2018-02-10 NOTE — Progress Notes (Signed)
Patient refused all morning medicines except Librium and Urocholine.
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
## 2018-02-11 ENCOUNTER — Inpatient Hospital Stay (HOSPITAL_COMMUNITY): Payer: Medicare PPO | Admitting: Speech Pathology

## 2018-02-11 ENCOUNTER — Inpatient Hospital Stay (HOSPITAL_COMMUNITY): Payer: Medicare PPO | Admitting: Occupational Therapy

## 2018-02-11 ENCOUNTER — Inpatient Hospital Stay (HOSPITAL_COMMUNITY): Payer: Medicare PPO

## 2018-02-11 MED ORDER — BETHANECHOL CHLORIDE 25 MG PO TABS
25.0000 mg | ORAL_TABLET | Freq: Three times a day (TID) | ORAL | Status: DC
  Administered 2018-02-11 – 2018-02-15 (×12): 25 mg via ORAL
  Filled 2018-02-11 (×14): qty 1

## 2018-02-11 NOTE — Progress Notes (Signed)
Offered patient water throughout shift. Educated about the need for hydration.

## 2018-02-11 NOTE — Progress Notes (Signed)
Speech Language Pathology Daily Session Note  Patient Details  Name: [name] [name] MRN: [id number] Date of Birth: 05-25-1940  Today's Date: 02/11/2018 SLP Individual Time: 0930-1025 SLP Individual Time Calculation (min): 55 min  Short Term Goals: Week 1: SLP Short Term Goal 1 (Week 1): Pt will consume trials of regular textures and thin liquids with supervision cues for use of swallowing precautions SLP Short Term Goal 1 - Progress (Week 1): Discontinued (comment) SLP Short Term Goal 2 (Week 1): Pt will recall milldy complex, daily information with min cues for use of external aids.    Skilled Therapeutic Interventions: Skilled treatment session focused on cognitive goals. Patient declined trials of regular textures and verbalized he prefers Dys. 3 textures at this time. Therefore, goal will be discharged.  SLP facilitated session by providing Max A verbal cues for recall of his current medications and their functions with minimal mental flexibility in regards to home medications vs current medications. Patient also appeared internally distracted and asked clinician repeatedly throughout the session for repositioning, etc. Patient also verbally frustrated with clinician when he thought she could not recall information he had previously told her, however, this is the first time this clinician has met this patient. Patient left upright in bed with alarm on and all needs within reach. Continue with current plan of care.      Pain No/Denies Pain   Therapy/Group: Individual Therapy  Keyshia Orwick 02/11/2018, 12:36 PM
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
## 2018-02-11 NOTE — Progress Notes (Signed)
Occupational Therapy Session Note  Patient Details  Name: [name] [name] MRN: [id number] Date of Birth: 09-20-1940  Today's Date: 02/11/2018 OT Individual Time: 1415-1500 OT Individual Time Calculation (min): 45 min  and Today's Date: 02/11/2018 OT Missed Time: 30 Minutes Missed Time Reason: Patient fatigue   Short Term Goals: Week 1:  OT Short Term Goal 1 (Week 1): Pt will utilize wrist support/u-cuff with mod assist to increase participation in self-feeding OT Short Term Goal 2 (Week 1): Pt will complete bed mobility with max assist of 1 caregiver to decrease burden of care with bathing/dressing OT Short Term Goal 3 (Week 1): Pt will obtain circle sitting in bed with mod assist to participate in self-care tasks OT Short Term Goal 4 (Week 1): Pt will complete slide board transfer bed <> w/c to prepare for transfers to toilet with max assist of 1 caregiver  Skilled Therapeutic Interventions/Progress Updates:    Pt greeted semi-reclined in bed after sitting up in TIS for part of the afternoon. Pt reports he just got back in the bed and declines OOB or EOB activity despite encouragement and education. Pt agreeable to bed level there-ex. Provided pt with soft tan thera-putty and worked on grip and pinch strengthening exercises. Pt very self limiting stating " I can't " before even trying to do what therapist is asking. Pt has more grip and pinch strength on R UE than L UE. AAROM for BUE with forward reach. OT provided slight resistance for triceps press and bicep curl in bed. Pt very particular on positioning in bed and had therapist reposition pillows multiple times before satisfied. Pt missed 30 mins of skilled OT 2/2 pt fatigue. Pt left semi-reclined in bed with needs met.   Therapy Documentation Precautions:  Precautions Precautions: Fall, Cervical Precaution Comments: Reviewed cervical precautions Required Braces or Orthoses: Cervical Brace Cervical Brace: At all times, Hard  collar Restrictions Weight Bearing Restrictions: No General: General OT Amount of Missed Time: 30 Minutes 2/2 pain and fatigue Pain: Pain Assessment Pain Scale: 0-10 Pain Score: 6  Pain Type: Acute pain Pain Location: Neck Pain Orientation: Lower Pain Descriptors / Indicators: Aching Pain Onset: On-going Pain Intervention(s): Repositioned    Other Treatments:     Therapy/Group: Individual Therapy  Valma Cava 02/11/2018, 3:26 PM
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
## 2018-02-11 NOTE — Progress Notes (Signed)
Physical Therapy Session Note  Patient Details  Name: [name] PlumberDavid Poynter MRN: [id number] Date of Birth: 12-27-1940  Today's Date: 02/11/2018 PT Individual Time: 1100-1200 PT Individual Time Calculation (min): 60 min   Short Term Goals: Week 1:  PT Short Term Goal 1 (Week 1): Pt will complete rolling L/R with assist x 1 PT Short Term Goal 2 (Week 1): Pt will maintain sitting balance EOB with max A x 1 PT Short Term Goal 3 (Week 1): Pt will tolerate sitting OOB in chair x 1 hour  Skilled Therapeutic Interventions/Progress Updates:    Pt received sidelying in bed, agreeable to PT. Pt reports pain in neck at rest, not rated. Rolling L/R with max A x 2 with skilled cueing for use of UE to assist with rolling for dependent donning of pants. Dependent to don TED hose and shoes in supine. Max A x 2 to don shirt while seated in bed with max A for multidirectional leaning and weight shift. Supine to sit with max A x 2 with HOB elevated. Total A for sitting balance EOB. Sliding board transfer bed to TIS with total A x 2. Adjusted w/c back height and head rest for improved support and comfort while in seated position in chair. Education with patient about importance of spending time out of bed and in chair for breathing, trunk musculature activation, and skin integrity. Education with patient about boosting schedule and set pt up with boosting schedule on wall so that nursing and therapy can keep track of time spent OOB. Seated BP 102/64, HR 92 at end of therapy session. Pt has no symptoms of orthostatic hypotension with position changes this date but does have some possibly vestibular related dizziness when rolling in bed. Pt left reclined in TIS w/c in room with NA taking over to assist pt with lunch.  Therapy Documentation Precautions:  Precautions Precautions: Fall, Cervical Precaution Comments: Reviewed cervical precautions Required Braces or Orthoses: Cervical Brace Cervical Brace: At all times, Hard  collar Restrictions Weight Bearing Restrictions: No   Therapy/Group: Individual Therapy   Peter Congoaylor Gerene Nedd, PT, DPT  02/11/2018, 12:22 PM
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
## 2018-02-12 ENCOUNTER — Encounter (HOSPITAL_COMMUNITY): Payer: Medicare PPO | Admitting: Psychology

## 2018-02-12 ENCOUNTER — Inpatient Hospital Stay (HOSPITAL_COMMUNITY): Payer: Medicare PPO | Admitting: Speech Pathology

## 2018-02-12 ENCOUNTER — Inpatient Hospital Stay (HOSPITAL_COMMUNITY): Payer: Medicare PPO | Admitting: Physical Therapy

## 2018-02-12 ENCOUNTER — Inpatient Hospital Stay (HOSPITAL_COMMUNITY): Payer: Medicare PPO

## 2018-02-12 ENCOUNTER — Inpatient Hospital Stay (HOSPITAL_COMMUNITY): Payer: Medicare PPO | Admitting: Occupational Therapy

## 2018-02-12 DIAGNOSIS — K5900 Constipation, unspecified: Secondary | ICD-10-CM

## 2018-02-12 DIAGNOSIS — G959 Disease of spinal cord, unspecified: Secondary | ICD-10-CM

## 2018-02-12 DIAGNOSIS — F4322 Adjustment disorder with anxiety: Secondary | ICD-10-CM

## 2018-02-12 LAB — URINALYSIS, COMPLETE (UACMP) WITH MICROSCOPIC
BILIRUBIN URINE: NEGATIVE
Glucose, UA: NEGATIVE mg/dL
Ketones, ur: 5 mg/dL — AB
Nitrite: NEGATIVE
Protein, ur: NEGATIVE mg/dL
Specific Gravity, Urine: 1.008 (ref 1.005–1.030)
pH: 6 (ref 5.0–8.0)

## 2018-02-12 NOTE — Care Management (Signed)
Inpatient Rehabilitation Center Individual Statement of Services  Patient Name:  [name] [name]  Date:  02/12/2018  Welcome to the Inpatient Rehabilitation Center.  Our goal is to provide you with an individualized program based on your diagnosis and situation, designed to meet your specific needs.  With this comprehensive rehabilitation program, you will be expected to participate in at least 3 hours of rehabilitation therapies Monday-Friday, with modified therapy programming on the weekends.  Your rehabilitation program will include the following services:  Physical Therapy (PT), Occupational Therapy (OT), 24 hour per day rehabilitation nursing, Therapeutic Recreaction (TR), Neuropsychology, Case Management (Social Worker), Rehabilitation Medicine, Nutrition Services and Pharmacy Services  Weekly team conferences will be held on Tuesdays to discuss your progress.  Your Social Worker will talk with you frequently to get your input and to update you on team discussions.  Team conferences with you and your family in attendance may also be held.  Expected length of stay: 4 weeks   Overall anticipated outcome: moderate assistance at wheelchair level Depending on your progress and recovery, your program may change. Your Social Worker will coordinate services and will keep you informed of any changes. Your Social Worker's name and contact numbers are listed  below.  The following services may also be recommended but are not provided by the Inpatient Rehabilitation Center:   Driving Evaluations  Home Health Rehabiltiation Services  Outpatient Rehabilitation Services   Arrangements will be made to provide these services after discharge if needed.  Arrangements include referral to agencies that provide these services.  Your insurance has been verified to be:  [place] [employer] Your primary doctor is:  Lonie Peakathan Conroy  Pertinent information will be shared with your doctor and your insurance  company.  Social Worker:  ClatskanieLucy Maryssa Giampietro, [place] [phone] or [phone]   Information discussed with and copy given to patient by: Amada JupiterHOYLE, Guinevere Stephenson, 02/12/2018, 12:58 PM
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
## 2018-02-12 NOTE — Progress Notes (Signed)
Speech Language Pathology Daily Session Note  Patient Details  Name: [name] [name] MRN: [id number] Date of Birth: 12/09/1940  Today's Date: 02/12/2018 SLP Individual Time: 1400-1425 SLP Individual Time Calculation (min): 25 min  Short Term Goals: Week 1: SLP Short Term Goal 1 (Week 1): Pt will consume trials of regular textures and thin liquids with supervision cues for use of swallowing precautions SLP Short Term Goal 1 - Progress (Week 1): Discontinued (comment) SLP Short Term Goal 2 (Week 1): Pt will recall milldy complex, daily information with min cues for use of external aids.    Skilled Therapeutic Interventions: Skilled treatment session focused on cognitive goals. SLP facilitated session by providing Min A verbal cues for recall of events from previous therapy sessions. Short-term memory deficits evident as patient consistently asks this clinician a question about something he had previously asked another clinician (asking if I found thumb tacks?).  Patient also required SLP to make him another calendar that he could see better despite him reporting he could see the calendar yesterday and was able to utilize for orientation to date independently. Patient left supine in bed with alarm on and all needs within reach. Continue with current plan of care.       Pain Pain Assessment Pain Scale: 0-10 Pain Score: 4  Faces Pain Scale: Hurts little more Pain Type: Acute pain Pain Location: Neck Pain Orientation: Lower Pain Descriptors / Indicators: Aching Pain Onset: On-going Pain Intervention(s): Repositioned  Therapy/Group: Individual Therapy  [name] [name] 02/12/2018, 3:15 PM

## 2018-02-12 NOTE — Progress Notes (Signed)
Physical Therapy Session Note  Patient Details  Name: [name] PlumberDavid Helbert MRN: [id number] Date of Birth: 1940/07/06  Today's Date: 02/12/2018 PT Individual Time: 0930-1000 and 1045-1055 PT Individual Time Calculation (min): 30 min and 10 min   Short Term Goals: Week 1:  PT Short Term Goal 1 (Week 1): Pt will complete rolling L/R with assist x 1 PT Short Term Goal 2 (Week 1): Pt will maintain sitting balance EOB with max A x 1 PT Short Term Goal 3 (Week 1): Pt will tolerate sitting OOB in chair x 1 hour  Skilled Therapeutic Interventions/Progress Updates:    pt rec'd fully reclined in TIS w/c. Pt states he feels ok to sit upright and perform UE strengthening exercises.  Pt performs peg task with hand over hand assist for Rt UE strength and coordination with mod A.  Pt left in room with needs at hand, fully reclined in TIS w/c.  Session 2: Pt c/o Rt shoulder and bilat LE pain.  Pt handed off from OT and states fatigue and refuses further therapy.  Pt positioned in bed for comfort and provided with hot pack for Rt shoulder.  Pt missed 30 minutes skilled PT due to refusal despite attempts from PT to educate pt on importance of mobility.  Therapy Documentation Precautions:  Precautions Precautions: Fall, Cervical Precaution Comments: Reviewed cervical precautions Required Braces or Orthoses: Cervical Brace Cervical Brace: At all times, Hard collar Restrictions Weight Bearing Restrictions: No Pain:  pt c/o shoulder pain after treatment, refuses offer of heat or ice, meds given prior to session   Therapy/Group: Individual Therapy  Londyn Hotard 02/12/2018, 10:02 AM

## 2018-02-12 NOTE — Progress Notes (Signed)
Patient had large BM that was hematochezia. Notified [place], [place]. Placed order for CBC tomorrow morning. No distress noted. Pt has no c/o of adbominal pain. Will cont to monitor.

## 2018-02-12 NOTE — Progress Notes (Signed)
Occupational Therapy Session Note  Patient Details  Name: [name] [name] MRN: [id number] Date of Birth: 01-23-1941  Today's Date: 02/12/2018 OT Individual Time: 1015-1051 OT Individual Time Calculation (min): 36 min    Short Term Goals: Week 1:  OT Short Term Goal 1 (Week 1): Pt will utilize wrist support/u-cuff with mod assist to increase participation in self-feeding OT Short Term Goal 2 (Week 1): Pt will complete bed mobility with max assist of 1 caregiver to decrease burden of care with bathing/dressing OT Short Term Goal 3 (Week 1): Pt will obtain circle sitting in bed with mod assist to participate in self-care tasks OT Short Term Goal 4 (Week 1): Pt will complete slide board transfer bed <> w/c to prepare for transfers to toilet with max assist of 1 caregiver   Skilled Therapeutic Interventions/Progress Updates:    Session focused on bed mobility, toileting tasks at bed level, and transfers. Upon entering room pt reporting incontinent BM in brief. Slideboard was positioned and pt completed lateral scoot with max +2 A to transfer to supine. Pt rolled R and L with max +1 A. Total A provided for clothing management and peri hygiene. Pt initially continued having BM and refused to use bed pan stating "its already there anyway". Pt was educated re skin integrity and establishing continent routines. Pt was left supine with all needs met, PT present.   Therapy Documentation Precautions:  Precautions Precautions: Fall, Cervical Precaution Comments: Reviewed cervical precautions Required Braces or Orthoses: Cervical Brace Cervical Brace: At all times, Hard collar Restrictions Weight Bearing Restrictions: No General: General PT Missed Treatment Reason: Patient fatigue Pain: Pain Assessment Pain Scale: Faces Faces Pain Scale: Hurts little more Pain Type: Acute pain Pain Location: Neck Pain Descriptors / Indicators: Aching Pain Onset: On-going Pain Intervention(s):  Repositioned   Therapy/Group: Individual Therapy  Curtis Sites 02/12/2018, 12:00 PM

## 2018-02-12 NOTE — Progress Notes (Signed)
Occupational Therapy Note  Patient Details  Name: [name] [name] MRN: [id number] Date of Birth: 1940/12/14  Today's Date: 02/12/2018 OT Missed Time: 45 Minutes Missed Time Reason: Patient unwilling/refused to participate without medical reason  Pt refused to participate in any therapy- no reason stated.    [name] 02/12/2018, 2:11 PM

## 2018-02-12 NOTE — Progress Notes (Signed)
Urine collected for routine and culture and sent to lab w/o discomfort or c/o

## 2018-02-12 NOTE — Progress Notes (Addendum)
Occupational Therapy Session Note  Patient Details  Name: [name] [name] MRN: [id number] Date of Birth: 1940/09/30  Today's Date: 02/12/2018 OT Individual Time: 8768-1157 OT Individual Time Calculation (min): 58 min   Short Term Goals: Week 1:  OT Short Term Goal 1 (Week 1): Pt will utilize wrist support/u-cuff with mod assist to increase participation in self-feeding OT Short Term Goal 2 (Week 1): Pt will complete bed mobility with max assist of 1 caregiver to decrease burden of care with bathing/dressing OT Short Term Goal 3 (Week 1): Pt will obtain circle sitting in bed with mod assist to participate in self-care tasks OT Short Term Goal 4 (Week 1): Pt will complete slide board transfer bed <> w/c to prepare for transfers to toilet with max assist of 1 caregiver  Skilled Therapeutic Interventions/Progress Updates:    Pt greeted semi-reclined in bed agreeable to OT treatment session. Pt reported the urge to urinate so urinal placed with total A, but he was not able to go. Pt declined any bathing this morning but did request to wash his face. Pt stated "I can't do it" but with encouragement, agreed to try. Pt able to bring wash cloth to his face with R UE, but throughout activity stated "see, I can't do nothing." OT provided hand over hand A to then bring L UE to face with wash cloth. Total A +2 to don TEDs and pants with rolling total A to pull pants up over hips. Total A +2 to come to sitting EOB. Worked on sitting balance at EOB with max/total A overall. Total A +2 slideboard transfer to TIS wc. B UE activity with level 1 clothes pins with OT providing hand over hand to pinch each clothespin, but then pt able to activate shoulders to reach for placement. Pt returned to room to work on self-feeding with pt refusing to use u-cuff, needing total A for self-feeding. Pt left tilted in wc with needs met.   Therapy Documentation Precautions:  Precautions Precautions: Fall, Cervical Precaution  Comments: Reviewed cervical precautions Required Braces or Orthoses: Cervical Brace Cervical Brace: At all times, Hard collar Restrictions Weight Bearing Restrictions: No Pain: Pain Assessment Pain Scale: 0-10 Pain Score: 4  Faces Pain Scale: Hurts little more Pain Type: Acute pain Pain Location: Neck Pain Orientation: Lower Pain Descriptors / Indicators: Aching Pain Onset: On-going Pain Intervention(s): Repositioned   Therapy/Group: Individual Therapy  Valma Cava 02/12/2018, 3:01 PM
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
## 2018-02-13 ENCOUNTER — Inpatient Hospital Stay (HOSPITAL_COMMUNITY): Payer: Medicare PPO

## 2018-02-13 ENCOUNTER — Inpatient Hospital Stay (HOSPITAL_COMMUNITY): Payer: Medicare PPO | Admitting: Speech Pathology

## 2018-02-13 ENCOUNTER — Inpatient Hospital Stay (HOSPITAL_COMMUNITY): Payer: Medicare PPO | Admitting: Physical Therapy

## 2018-02-13 ENCOUNTER — Inpatient Hospital Stay (HOSPITAL_COMMUNITY): Payer: Medicare PPO | Admitting: Occupational Therapy

## 2018-02-13 DIAGNOSIS — M5000 Cervical disc disorder with myelopathy, unspecified cervical region: Secondary | ICD-10-CM

## 2018-02-13 LAB — CBC
HEMATOCRIT: 38 % — AB (ref 39.0–52.0)
Hemoglobin: 12.1 g/dL — ABNORMAL LOW (ref 13.0–17.0)
MCH: 27.9 pg (ref 26.0–34.0)
MCHC: 31.8 g/dL (ref 30.0–36.0)
MCV: 87.8 fL (ref 80.0–100.0)
Platelets: 319 10*3/uL (ref 150–400)
RBC: 4.33 MIL/uL (ref 4.22–5.81)
RDW: 12.2 % (ref 11.5–15.5)
WBC: 9.4 10*3/uL (ref 4.0–10.5)
nRBC: 0 % (ref 0.0–0.2)

## 2018-02-13 MED ORDER — BISACODYL 10 MG RE SUPP
10.0000 mg | Freq: Every day | RECTAL | Status: DC
  Administered 2018-02-13 – 2018-02-18 (×3): 10 mg via RECTAL
  Filled 2018-02-13 (×6): qty 1

## 2018-02-13 MED ORDER — CEPHALEXIN 250 MG PO CAPS
250.0000 mg | ORAL_CAPSULE | Freq: Three times a day (TID) | ORAL | Status: DC
  Administered 2018-02-13 – 2018-02-19 (×17): 250 mg via ORAL
  Filled 2018-02-13 (×17): qty 1
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
## 2018-02-13 NOTE — Progress Notes (Signed)
Occupational Therapy Session Note  Patient Details  Name: [name] PlumberDavid Schweiss MRN: [id number] Date of Birth: Dec 01, 1940  Today's Date: 02/13/2018 OT Individual Time: 9629-52841150-1215 OT Individual Time Calculation (min): 25 min    Short Term Goals: Week 3:     Skilled Therapeutic Interventions/Progress Updates:    Patient alert and agreeable to participate in UE ROM/stretching activities, completed weight shift/repositioning and LB isometric contractions to assist with pain control.  Patient talkative today and aware of immediate needs.  He remained in TIS w/c in reclined position with call bell and suction within reach  Therapy Documentation Precautions:  Precautions Precautions: Fall, Cervical Precaution Comments: Reviewed cervical precautions Required Braces or Orthoses: Cervical Brace Cervical Brace: At all times, Hard collar Restrictions Weight Bearing Restrictions: No General:   Vital Signs:  Pain: Pain Assessment Pain Scale: 0-10 Pain Score: 2  Faces Pain Scale: Hurts little more Pain Type: Acute pain Pain Location: Buttocks Pain Orientation: Left Pain Descriptors / Indicators: Discomfort Pain Onset: On-going Pain Intervention(s): Repositioned  Therapy/Group: Individual Therapy  Barrie LymeStacey A Chief Walkup 02/13/2018, 12:23 PM

## 2018-02-13 NOTE — Progress Notes (Signed)
Orthopedic Tech Progress Note Patient Details:  [name] PlumberDavid [name] 1940/05/03 [id number]  Ortho Devices Type of Ortho Device: Prafo boot/shoe Ortho Device/Splint Location: delivered to rn Ortho Device/Splint Interventions: Ordered  when I went to apply prafo boot the rn was in the room and took the boot.     Trinna PostMartinez, Maruice Pieroni J 02/13/2018, 10:55 AM
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
## 2018-02-13 NOTE — Progress Notes (Signed)
Occupational Therapy Session Note  Patient Details  Name: [name] [name] MRN: [id number] Date of Birth: [birth year]  Today's Date: 02/13/2018 OT Individual Time: 1030-1130 OT Individual Time Calculation (min): 60 min    Short Term Goals: Week 1:  OT Short Term Goal 1 (Week 1): Pt will utilize wrist support/u-cuff with mod assist to increase participation in self-feeding OT Short Term Goal 2 (Week 1): Pt will complete bed mobility with max assist of 1 caregiver to decrease burden of care with bathing/dressing OT Short Term Goal 3 (Week 1): Pt will obtain circle sitting in bed with mod assist to participate in self-care tasks OT Short Term Goal 4 (Week 1): Pt will complete slide board transfer bed <> w/c to prepare for transfers to toilet with max assist of 1 caregiver  Skilled Therapeutic Interventions/Progress Updates:    Session focused on bed mobility, UB bathing/dressing, and ADL transfers. Pt rolled R with min A while RN performed care. Max A to roll L. Pt transitioned to sitting EOB with total +2 A. Total A required for sitting balance EOB. Pt used slideboard to transfer to TIS w/c, requiring total A +2. Once up in chair, pt completed UB bathing at sink using a mit on his R UE with mod A for thoroughness and manual facilitation to position L UE to reach underneath R UE. Pt incredibly self-limiting and demanding to therapist throughout session. Pt was left sitting up in TIS w/c at end with towels positioned at all spots his legs came in contact with chair to minimize potential pressure injury and chair tilted back to reduce pressure on his sacrum. Soft call bell was pinned to his shirt to ensure access. Pain remained constant throughout session, as described below, RN aware.   Therapy Documentation Precautions:  Precautions Precautions: Fall, Cervical Precaution Comments: Reviewed cervical precautions Required Braces or Orthoses: Cervical Brace Cervical Brace: At all times, Hard  collar Restrictions Weight Bearing Restrictions: No Pain: Pain Assessment Pain Scale: Faces Faces Pain Scale: Hurts little more Pain Type: Acute pain Pain Location: Neck Pain Orientation: Anterior Pain Descriptors / Indicators: Aching Pain Onset: On-going Pain Intervention(s): Repositioned   Therapy/Group: Individual Therapy  Crissie ReeseSandra H Kaeleigh Westendorf 02/13/2018, 12:19 PM

## 2018-02-13 NOTE — Progress Notes (Signed)
Physical Therapy Session Note  Patient Details  Name: [name] [name] MRN: [id number] Date of Birth: 1940-10-18  Today's Date: 02/13/2018 PT Individual Time: 0900-0930 PT Individual Time Calculation (min): 30 min   Short Term Goals: Week 1:  PT Short Term Goal 1 (Week 1): Pt will complete rolling L/R with assist x 1 PT Short Term Goal 2 (Week 1): Pt will maintain sitting balance EOB with max A x 1 PT Short Term Goal 3 (Week 1): Pt will tolerate sitting OOB in chair x 1 hour  Skilled Therapeutic Interventions/Progress Updates:    Patient received in bed in left sidelying, willing to participate in therapy but complaining of buttock pain. Performed functional stretching of B LEs and and B hands as time allowed and as tolerated by patient today, education provided on importance of pressure relief with this type of injury. Patient with questions regarding cushion in his chair as he reports it is painful for him to sit, educated that this therapist will pass this information onto primary PT and inquire about possible transition to air cushion. He was repositioned into R sidelying for pressure relief at EOS and was left in bed with all needs met and bed alarm active this morning.   Therapy Documentation Precautions:  Precautions Precautions: Fall, Cervical Precaution Comments: Reviewed cervical precautions Required Braces or Orthoses: Cervical Brace Cervical Brace: At all times, Hard collar Restrictions Weight Bearing Restrictions: No General:   Vital Signs:  Pain: Pain Assessment Pain Scale: 0-10 Pain Score: 6  Faces Pain Scale: Hurts little more Pain Type: Acute pain Pain Location: Buttocks Pain Orientation: Right;Left Pain Descriptors / Indicators: Discomfort Pain Onset: On-going Patients Stated Pain Goal: 0 Pain Intervention(s): Repositioned    Therapy/Group: Individual Therapy  Deniece Ree PT, DPT, CBIS  Supplemental Physical Therapist [facility]    Pager  [phone] Acute Rehab Office [phone]   02/13/2018, 12:29 PM

## 2018-02-13 NOTE — Progress Notes (Signed)
Physical Therapy Session Note  Patient Details  Name: [name] PlumberDavid Hartwig MRN: [id number] Date of Birth: Mar 21, 1940  Today's Date: 02/13/2018 PT Individual Time: 1300-1400 PT Individual Time Calculation (min): 60 min   Short Term Goals: Week 1:  PT Short Term Goal 1 (Week 1): Pt will complete rolling L/R with assist x 1 PT Short Term Goal 2 (Week 1): Pt will maintain sitting balance EOB with max A x 1 PT Short Term Goal 3 (Week 1): Pt will tolerate sitting OOB in chair x 1 hour  Skilled Therapeutic Interventions/Progress Updates:    Pt received reclined in TIS w/c in room, agreeable to PT. Maximove transfer w/c to tilt table. Pt has onset of neck pain while in maxi-sling, pain decreases once sling removed. Supine BP 115/69, HR 92. Transitioned tilt table from 0 degrees (completely flat) to 10 degrees. Pt remains asymptomatic. Progression to 20 degree tilt. BP 94/62, HR 93. LLE isometric quad sets x 10 reps. BP comes up to 103/65 with isometrics and as pt gets accustomed to position. Pt tolerates 20 degrees in tilt table x 15 min while working on LLE isometrics, RLE PROM, and WBing through BLE. Maximove transfer tilt table to w/c. Pt reports ongoing pain in buttock region while seated in chair. Education with patient about pressure relief techniques and to be an advocate for himself and to call nursing to assist with pressure relief. Reviewed boosting scheduled with patient. Pt left reclined in TIS w/c handed off to OT for next therapy session.  Therapy Documentation Precautions:  Precautions Precautions: Fall, Cervical Precaution Comments: Reviewed cervical precautions Required Braces or Orthoses: Cervical Brace Cervical Brace: At all times, Hard collar Restrictions Weight Bearing Restrictions: No   Therapy/Group: Individual Therapy  Peter Congoaylor Johncarlo Maalouf, PT, DPT  02/13/2018, 3:33 PM

## 2018-02-13 NOTE — Progress Notes (Signed)
Occupational Therapy Session Note  Patient Details  Name: Karle PlumberDavid Koelling MRN: [id number] Date of Birth: 1940/03/18  Today's Date: 02/13/2018 OT Individual Time: 1400-1420 OT Individual Time Calculation (min): 20 min    Short Term Goals: Week 2:     Skilled Therapeutic Interventions/Progress Updates:    Patient in w/c, just finished PT session.  Poor recall of earlier therapy session with me.  SB transfer into bed max A of one with min A of 2nd, sat edge of bed with min A to maintain balance, max A SSP to supine.  Dependent to reposition in bed.  Changed soiled brief - dependent.  Maintained side lying position.  Call bell in reach  Therapy Documentation Precautions:  Precautions Precautions: Fall, Cervical Precaution Comments: Reviewed cervical precautions Required Braces or Orthoses: Cervical Brace Cervical Brace: At all times, Hard collar Restrictions Weight Bearing Restrictions: No General:   Vital Signs:  Pain: Pain Assessment Pain Scale: 0-10 Pain Score: 3  Faces Pain Scale: Hurts little more Pain Type: Acute pain Pain Location: Buttocks Pain Orientation: Right;Left Pain Descriptors / Indicators: Discomfort Pain Onset: On-going Patients Stated Pain Goal: 0 Pain Intervention(s): Repositioned;Rest(side lying in bed)      Therapy/Group: Individual Therapy  Barrie LymeStacey A Shanay Woolman 02/13/2018, 2:31 PM
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
## 2018-02-13 NOTE — Progress Notes (Signed)
Speech Language Pathology Daily Session Note  Patient Details  Name: [name] [name] MRN: [id number] Date of Birth: [birth year]  Today's Date: 02/13/2018 SLP Individual Time: 0930-1010 SLP Individual Time Calculation (min): 40 min  Short Term Goals: Week 1: SLP Short Term Goal 1 (Week 1): Pt will consume trials of regular textures and thin liquids with supervision cues for use of swallowing precautions SLP Short Term Goal 1 - Progress (Week 1): Discontinued (comment) SLP Short Term Goal 2 (Week 1): Pt will recall milldy complex, daily information with min cues for use of external aids.    Skilled Therapeutic Interventions: Skilled treatment session focused on cognitive goals. Upon arrival, patient supine in bed and perseverative on pain in buttocks area. Patient repositioned multiple times at his request and RN aware and administered medications. SLP facilitated session by providing Min A verbal cues for recall of events from previous therapy sessions. SLP also initiated education in regards to memory compensatory strategies in which patient reported he "won't need" when he discharges to the next venue of care and patient provided education on how strategies can maximize recall and carryover of daily information and new learning in regards to transfers. Patient very demanding throughout session and asking clinician to do multiple things at one time and becoming frustrated when he felt they were not getting completed in a timely manner. Patient left supine in bed with alarm on and all needs within reach. Continue with current plan of care.      Pain Pain Assessment Pain Scale: 0-10 Pain Score: 3  Faces Pain Scale: Hurts little more Pain Type: Acute pain Pain Location: Buttocks Pain Orientation: Right;Left Pain Descriptors / Indicators: Discomfort Pain Onset: On-going Patients Stated Pain Goal: 0 Pain Intervention(s): Repositioned;Rest(side lying in bed)  Therapy/Group: Individual  Therapy  Delonta Yohannes 02/13/2018, 3:23 PM

## 2018-02-14 ENCOUNTER — Inpatient Hospital Stay (HOSPITAL_COMMUNITY): Payer: Medicare PPO | Admitting: Occupational Therapy

## 2018-02-14 ENCOUNTER — Inpatient Hospital Stay (HOSPITAL_COMMUNITY): Payer: Medicare PPO | Admitting: Speech Pathology

## 2018-02-14 ENCOUNTER — Inpatient Hospital Stay (HOSPITAL_COMMUNITY): Payer: Medicare PPO | Admitting: Physical Therapy

## 2018-02-14 LAB — URINE CULTURE

## 2018-02-14 MED ORDER — ACETAMINOPHEN 325 MG PO TABS
650.0000 mg | ORAL_TABLET | Freq: Four times a day (QID) | ORAL | Status: DC
  Administered 2018-02-15 – 2018-02-19 (×6): 650 mg via ORAL
  Filled 2018-02-14 (×11): qty 2
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
## 2018-02-14 NOTE — Progress Notes (Signed)
Occupational Therapy Session Note  Patient Details  Name: [name] [name] MRN: [id number] Date of Birth: 19-Mar-1940  Today's Date: 02/14/2018 OT Individual Time: 3335-4562 OT Individual Time Calculation (min): 57 min   Short Term Goals: Week 1:  OT Short Term Goal 1 (Week 1): Pt will utilize wrist support/u-cuff with mod assist to increase participation in self-feeding OT Short Term Goal 2 (Week 1): Pt will complete bed mobility with max assist of 1 caregiver to decrease burden of care with bathing/dressing OT Short Term Goal 3 (Week 1): Pt will obtain circle sitting in bed with mod assist to participate in self-care tasks OT Short Term Goal 4 (Week 1): Pt will complete slide board transfer bed <> w/c to prepare for transfers to toilet with max assist of 1 caregiver  Skilled Therapeutic Interventions/Progress Updates:    Pt greeted semi-reclined in bed and agreeable to OT treatment session. Pt continues to be very self-limiting throughout session and needs encouragement to try any self-care activities. Total A +2 LB bathing/dressing. Pt was able to assist with washing R upper thigh today with initial hand over hand A and encouragement. Total A to don Teds and thread pants. Rolling L and R with total A +2 to pull up pants. UE stretching and massage before getting OOB. Total A to come to sitting EOB. Total A+2 slideboard transfer to TIS wc. Pt brought to the sink for UB bathing/dressing. Pt able to wash chest/stomach with R hand with OT assist for thoroughness. Worked on UB dressing with pt able to pinch and pull up some clothing with R hand. Pt left tilted in wc with needs met.   Therapy Documentation Precautions:  Precautions Precautions: Fall, Cervical Precaution Comments: Reviewed cervical precautions Required Braces or Orthoses: Cervical Brace Cervical Brace: At all times, Hard collar Restrictions Weight Bearing Restrictions: (P) No Pain:  none/denies pain  Therapy/Group: Individual  Therapy    Valma Cava 02/14/2018, 8:33 AM

## 2018-02-14 NOTE — Progress Notes (Signed)
Physical Therapy Weekly Progress Note  Patient Details  Name: [name] [name] MRN: [id number] Date of Birth: Dec 03, 1940  Beginning of progress report period: February 08, 2018 End of progress report period: February 14, 2018  Today's Date: 02/14/2018 PT Individual Time: 1829-9371 PT Individual Time Calculation (min): 57 min   Pt in bed and agreeable to therapy.  Pt in sidelying, sidelying to sit with max A, max A for sitting balance edge of bed .  Total +2 assist for sliding board transfers.  Tilt table performed with values listed below. Pt able to tolerate up to 40 degrees with no c/o dizziness. While on tilt table pt performs isometric knee extension with improving bilat strength.  Pt left in w/c with safety belt donned, needs at hand.  Supine: 100/59 20 degrees: 91/59 30 degrees 99/57 40 degrees 103/72  Patient has met 3 of 4 short term goals.  Pt limited by decreased motivation and decreased participation, still requiring total A for transfers, able to perform sitting balance and bed mobility with max A.    Patient continues to demonstrate the following deficits muscle weakness, abnormal tone, unbalanced muscle activation and decreased coordination and decreased sitting balance, decreased postural control and decreased balance strategies and therefore will continue to benefit from skilled PT intervention to increase functional independence with mobility.  Patient progressing toward long term goals..  Plan of care revisions: d/c changed to SNF.  PT Short Term Goals Week 1:  PT Short Term Goal 1 (Week 1): Pt will complete rolling L/R with assist x 1 PT Short Term Goal 1 - Progress (Week 1): Met PT Short Term Goal 2 (Week 1): Pt will maintain sitting balance EOB with max A x 1 PT Short Term Goal 2 - Progress (Week 1): Met PT Short Term Goal 3 (Week 1): Pt will tolerate sitting OOB in chair x 1 hour PT Short Term Goal 3 - Progress (Week 1): Met Week 2:  PT Short Term Goal 1 (Week 2): =  LTG  Skilled Therapeutic Interventions/Progress Updates:  Ambulation/gait training;Balance/vestibular training;Cognitive remediation/compensation;Community reintegration;Discharge planning;Disease management/prevention;DME/adaptive equipment instruction;Functional electrical stimulation;Functional mobility training;Neuromuscular re-education;Pain management;Patient/family education;Psychosocial support;Splinting/orthotics;Therapeutic Activities;Therapeutic Exercise;UE/LE Strength taining/ROM;UE/LE Coordination activities;Wheelchair propulsion/positioning   Therapy Documentation Precautions:  Precautions Precautions: Fall, Cervical Precaution Comments: Reviewed cervical precautions Required Braces or Orthoses: Cervical Brace Cervical Brace: At all times, Hard collar Restrictions Weight Bearing Restrictions: (P) No Pain:  pt c/o neck pain while in maxi move sling, eases when out of position  Therapy/Group: Individual Therapy  , 02/14/2018, 3:12 PM

## 2018-02-14 NOTE — Progress Notes (Signed)
Occupational Therapy Session Note  Patient Details  Name: [name] [name] MRN: [id number] Date of Birth: Feb 25, 1941  Today's Date: 02/14/2018 OT Individual Time: 1530-1605 OT Individual Time Calculation (min): 35 min    Short Term Goals: Week 2:     Skilled Therapeutic Interventions/Progress Updates:    Patient states "I"m done, I will not do anything with you" and then proceeded to ask for assistance into bed and was willing to participate in AAROM/light stretching and repositioning.   SB transfer w/c to bed with max A of one and min A of second, SSP edge of bed tolerated for 3-4 minutes with min A to maintain position without dynamic activity.  SSP to supine dependent of one.  Completed bilateral UE AAROM proximal with focus on scapular position.  Positioned in side lying with suction and call bell in reach.  Nursing aware of pain complaints.   Therapy Documentation Precautions:  Precautions Precautions: Fall, Cervical Precaution Comments: Reviewed cervical precautions Required Braces or Orthoses: Cervical Brace Cervical Brace: At all times, Hard collar Restrictions Weight Bearing Restrictions: (P) No General: General OT Amount of Missed Time: 10 Minutes Vital Signs: Therapy Vitals Temp: 98.5 F (36.9 C) Pulse Rate: (!) 103 Resp: 18 BP: 97/70 Patient Position (if appropriate): Lying Oxygen Therapy SpO2: 97 % O2 Device: Room Air Pain: Pain Assessment Pain Scale: 0-10 Pain Score: 7  Pain Location: Buttocks Pain Descriptors / Indicators: Discomfort Pain Intervention(s): RN made aware;Repositioned   Therapy/Group: Individual Therapy  Barrie LymeStacey A Ejay Lashley 02/14/2018, 4:35 PM

## 2018-02-14 NOTE — Progress Notes (Signed)
Social Work Patient ID: [name] PlumberDavid [name], male   DOB: 1940/03/22, 77 y.o.   MRN: [id number]   Have discussed pt's d/c plans with pt and his sister.  Continue to search for SNF bed as his preferred choice of facilities is unable to accept due to insurance coverage issues.  Have widened search and will keep team posted.  Delrae Hagey, LCSW

## 2018-02-14 NOTE — Progress Notes (Signed)
Speech Language Pathology Daily Session Note  Patient Details  Name: [name] [name] MRN: [id number] Date of Birth: 1941-01-22  Today's Date: 02/14/2018 SLP Individual Time: 1100-1115 SLP Individual Time Calculation (min): 15 min and Today's Date: 02/14/2018 SLP Missed Time: 15 Minutes Missed Time Reason: Nursing care  Short Term Goals: Week 1: SLP Short Term Goal 1 (Week 1): Pt will consume trials of regular textures and thin liquids with supervision cues for use of swallowing precautions SLP Short Term Goal 1 - Progress (Week 1): Discontinued (comment) SLP Short Term Goal 2 (Week 1): Pt will recall milldy complex, daily information with min cues for use of external aids.    Skilled Therapeutic Interventions: Skilled treatment session focused on cognitive goals. Upon arrival, patient was supine in bed and reported a burning sensation "like he had to pee." Patient unable to recall the last time he was cathed, therefore, RN notified. Patient's sister present and educated in regards to patient's current memory impairments and goals of skilled SLP intervention. Session ended early due to RN care (cathing). Patient left supine in bed with alarm on. Continue with current plan of care.      Pain Burning sensation, RN aware.   Therapy/Group: Individual Therapy  Cortni Tays 02/14/2018, 3:41 PM

## 2018-02-15 ENCOUNTER — Inpatient Hospital Stay (HOSPITAL_COMMUNITY): Payer: Medicare PPO

## 2018-02-15 ENCOUNTER — Inpatient Hospital Stay (HOSPITAL_COMMUNITY): Payer: Medicare PPO | Admitting: Physical Therapy

## 2018-02-15 ENCOUNTER — Inpatient Hospital Stay (HOSPITAL_COMMUNITY): Payer: Medicare PPO | Admitting: Occupational Therapy

## 2018-02-15 LAB — BASIC METABOLIC PANEL
Anion gap: 16 — ABNORMAL HIGH (ref 5–15)
BUN: 13 mg/dL (ref 8–23)
CO2: 24 mmol/L (ref 22–32)
CREATININE: 0.75 mg/dL (ref 0.61–1.24)
Calcium: 9 mg/dL (ref 8.9–10.3)
Chloride: 94 mmol/L — ABNORMAL LOW (ref 98–111)
GFR calc Af Amer: >60 mL/min (ref >60)
GFR calc non Af Amer: >60 mL/min (ref >60)
Glucose, Bld: 112 mg/dL — ABNORMAL HIGH (ref 70–99)
Potassium: 3.4 mmol/L — ABNORMAL LOW (ref 3.5–5.1)
SODIUM: 134 mmol/L — AB (ref 135–145)

## 2018-02-15 LAB — CBC
HEMATOCRIT: 41.6 % (ref 39.0–52.0)
Hemoglobin: 13.1 g/dL (ref 13.0–17.0)
MCH: 27.9 pg (ref 26.0–34.0)
MCHC: 31.5 g/dL (ref 30.0–36.0)
MCV: 88.5 fL (ref 80.0–100.0)
Platelets: 286 10*3/uL (ref 150–400)
RBC: 4.7 MIL/uL (ref 4.22–5.81)
RDW: 12.4 % (ref 11.5–15.5)
WBC: 5.3 10*3/uL (ref 4.0–10.5)
nRBC: 0 % (ref 0.0–0.2)
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
## 2018-02-15 NOTE — Progress Notes (Signed)
Physical Therapy Session Note  Patient Details  Name: Karle PlumberDavid Fortin MRN: [id number] Date of Birth: [birth year]  Today's Date: 02/15/2018 PT Individual Time: [id number] PT Individual Time Calculation (min): 60 min   Short Term Goals: Week 2:  PT Short Term Goal 1 (Week 2): = LTG  Skilled Therapeutic Interventions/Progress Updates:    pt rec'd in w/c agreeable to therapy.  Pt performs standing frame 3 x 4 minutes with focus on increasing back strength to improve upright posture. Pt requires max A for upright posture due to trunk and UE weakness.  No c/o dizziness during standing frame. Sliding board transfers throughout session with +2 total A. Seated balance edge of mat with reaching activity with max A for sitting balance, improved ability to wt shift. Pt left in bed, positioned for comfort, needs at hand, alarm set.   Therapy Documentation Precautions:  Precautions Precautions: Fall, Cervical Precaution Comments: Reviewed cervical precautions Required Braces or Orthoses: Cervical Brace Cervical Brace: At all times, Hard collar Restrictions Weight Bearing Restrictions: (P) No Pain: Pt c/o pain in back during standing, eases with rest   Therapy/Group: Individual Therapy  Destini Cambre 02/15/2018, 12:14 PM

## 2018-02-15 NOTE — Progress Notes (Signed)
Patient refusing teaching on bowel and bladder program as well as refusing medications for both.  He states he has heard all this education before and does not have a problem with either.  He is argumentative when attempted to verify understanding of important recovery topics, refusing teaching and not taking any responsibility for anything, blaming staff for his current condition and barriers to discharge.  Dani Gobbleeardon, Zorah Backes J, RN

## 2018-02-15 NOTE — Progress Notes (Signed)
Occupational Therapy Session Note  Patient Details  Name: [name] [name] MRN: [id number] Date of Birth: 02/15/1941  Today's Date: 02/15/2018 OT Individual Time: 1415-1500 OT Individual Time Calculation (min): 45 min   Short Term Goals: Week 1:  OT Short Term Goal 1 (Week 1): Pt will utilize wrist support/u-cuff with mod assist to increase participation in self-feeding OT Short Term Goal 2 (Week 1): Pt will complete bed mobility with max assist of 1 caregiver to decrease burden of care with bathing/dressing OT Short Term Goal 3 (Week 1): Pt will obtain circle sitting in bed with mod assist to participate in self-care tasks OT Short Term Goal 4 (Week 1): Pt will complete slide board transfer bed <> w/c to prepare for transfers to toilet with max assist of 1 caregiver  Skilled Therapeutic Interventions/Progress Updates:    Pt greeted semi-reclined in bed. As soon as therapist walked in pt stated " I ain't getting out of this bed again." Discussed OT goals with pt, but he continued to declined any OOB activities. Encouraged pt to try some self feeding of french fries with R UE. Pt needed max encouragement to try to eat one french fry with OT providing guided A at the eblow. Pt was however able to bring french fry to mouth without OT assist despite pt saying he couldn't. Pt refused to try any more self feeing stating "It makes me mad, so you just have to feed me." Utilzied therapeutic use of self to try to encourage pt and help him see how much he is doing, but he became agitated with OTs encouragement. Pt requested to wash face and brush teeth and would only do activity in bed. Pt able to wash face with OT providing assist for thoroughness. Pt also able to bring wash cloth up to head but pt gave up quickly and just wanted therapist to do it. Explained the importance of pt trying to do things for himself and he again became agitated. Provided pt with toothbrush set-up and assisted with maintaining grip on  toothbrush, then pt able to bring toothbrush to mouth and assist with brushing task. Yonker used as well. Pt refused any further activity and was left semi-reclined in bed with call bell in reach and bed alarm on.   Therapy Documentation Precautions:  Precautions Precautions: Fall, Cervical Precaution Comments: Reviewed cervical precautions Required Braces or Orthoses: Cervical Brace Cervical Brace: At all times, Hard collar Restrictions Weight Bearing Restrictions: (P) No General: General OT Amount of Missed Time: 30 Minutes Pt refused to participate further Pain: Pain Assessment Pain Scale: 0-10 Pain Score: 7  Pain Type: Acute pain Pain Location: Neck Pain Orientation: Left Pain Descriptors / Indicators: Discomfort Pain Intervention(s): Repositioned  Therapy/Group: Individual Therapy  [name] [name] 02/15/2018, 3:10 PM

## 2018-02-15 NOTE — Progress Notes (Signed)
Occupational Therapy Session Note  Patient Details  Name: Ogle Hoeffner MRN: [id number] Date of Birth: November 30, 1940  Today's Date: 02/15/2018 OT Individual Time: 0930-1000 OT Individual Time Calculation (min): 30 min    Short Term Goals: Week 1:  OT Short Term Goal 1 (Week 1): Pt will utilize wrist support/u-cuff with mod assist to increase participation in self-feeding OT Short Term Goal 2 (Week 1): Pt will complete bed mobility with max assist of 1 caregiver to decrease burden of care with bathing/dressing OT Short Term Goal 3 (Week 1): Pt will obtain circle sitting in bed with mod assist to participate in self-care tasks OT Short Term Goal 4 (Week 1): Pt will complete slide board transfer bed <> w/c to prepare for transfers to toilet with max assist of 1 caregiver  Skilled Therapeutic Interventions/Progress Updates:    1:1. Pt received in bed. Pt very verbose requiring redirection. Pt requires total A to don pants and rolls B with +2 A to advance pants past hips. Pt slide board transfers total A (pt 15%) EOB>w/c with +2 steadying the chair. Pt completes face washing at sink with wash mitt on RUE. Exited session with tp seated in TIS tilted back with call light in reach and all needs met  Therapy Documentation Precautions:  Precautions Precautions: Fall, Cervical Precaution Comments: Reviewed cervical precautions Required Braces or Orthoses: Cervical Brace Cervical Brace: At all times, Hard collar Restrictions Weight Bearing Restrictions: (P) No   Therapy/Group: Individual Therapy  Tonny Branch 02/15/2018, 12:18 PM

## 2018-02-16 DIAGNOSIS — S0101XS Laceration without foreign body of scalp, sequela: Secondary | ICD-10-CM

## 2018-02-16 DIAGNOSIS — A499 Bacterial infection, unspecified: Secondary | ICD-10-CM

## 2018-02-16 DIAGNOSIS — N390 Urinary tract infection, site not specified: Secondary | ICD-10-CM

## 2018-02-16 DIAGNOSIS — M4712 Other spondylosis with myelopathy, cervical region: Secondary | ICD-10-CM
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
## 2018-02-17 ENCOUNTER — Inpatient Hospital Stay (HOSPITAL_COMMUNITY): Payer: Medicare PPO | Admitting: Physical Therapy

## 2018-02-17 ENCOUNTER — Inpatient Hospital Stay (HOSPITAL_COMMUNITY): Payer: Medicare PPO

## 2018-02-17 MED ORDER — OXYBUTYNIN CHLORIDE 5 MG PO TABS
5.0000 mg | ORAL_TABLET | Freq: Two times a day (BID) | ORAL | Status: DC
  Administered 2018-02-17 – 2018-02-19 (×4): 5 mg via ORAL
  Filled 2018-02-17 (×4): qty 1

## 2018-02-17 NOTE — Progress Notes (Signed)
Physical Therapy Session Note  Patient Details  Name: [name] [name] MRN: [id number] Date of Birth: Jul 05, 1940  Today's Date: 02/17/2018 PT Individual Time: 1400-1458 PT Individual Time Calculation (min): 58 min   Short Term Goals: Week 2:  PT Short Term Goal 1 (Week 2): = LTG  Skilled Therapeutic Interventions/Progress Updates:    Pt supine in bed upon PT arrival, agreeable to therapy tx and reports pain 7/10 in L sacrum and cerivcal region. Pt performed rolling in both directions with max assist +2 to don shorts. Total assist to don teds and shoes. Pt transferred supine>sitting EOB with total assist and performed slideboard transfer to TIS w/c with total assist +2. Pt transported to the sink. Pt participated in ADLs to wash face and brush teeth using universal cuff, hand over hand assist, max assist for these tasks. Pt transported to the gym. Therapist performed LE stretching including hamstrings, heel cords and hip muscles x 6 minutes. Pt performed 2 x 10 LAQ bilaterally, active assisted with R LE. Pt transported back to room, requesting to get back in bed. Slideboard to bed total assist +2. Pt left sidelying positioned with pillows for pressure relief, needs in reach and bed alarm set.   Therapy Documentation Precautions:  Precautions Precautions: Fall, Cervical Precaution Comments: Reviewed cervical precautions Required Braces or Orthoses: Cervical Brace Cervical Brace: At all times, Hard collar Restrictions Weight Bearing Restrictions: No    Therapy/Group: Individual Therapy  Cresenciano GenreEmily van Schagen, PT, DPT 02/17/2018, 2:02 PM
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
## 2018-02-18 ENCOUNTER — Inpatient Hospital Stay (HOSPITAL_COMMUNITY): Payer: Medicare PPO | Admitting: Speech Pathology

## 2018-02-18 ENCOUNTER — Inpatient Hospital Stay (HOSPITAL_COMMUNITY): Payer: Medicare PPO | Admitting: Physical Therapy

## 2018-02-18 ENCOUNTER — Inpatient Hospital Stay (HOSPITAL_COMMUNITY): Payer: Medicare PPO | Admitting: Occupational Therapy

## 2018-02-18 MED ORDER — ASPIRIN EC 81 MG PO TBEC
81.0000 mg | DELAYED_RELEASE_TABLET | Freq: Every day | ORAL | Status: DC
  Administered 2018-02-18 – 2018-02-19 (×2): 81 mg via ORAL
  Filled 2018-02-18 (×2): qty 1

## 2018-02-18 NOTE — Progress Notes (Signed)
Physical Therapy Session Note  Patient Details  Name: [name] PlumberDavid Kilian MRN: [id number] Date of Birth: [birth year]  Today's Date: 02/18/2018 PT Individual Time: [id number] PT Individual Time Calculation (min): 70 min   Short Term Goals: Week 2:  PT Short Term Goal 1 (Week 2): = LTG  Skilled Therapeutic Interventions/Progress Updates:    pt in bed agreeable to therapy.  Mod A for rolling with total A to don shorts.  Max A for supine to sit.  Sitting balance edge of bed with max A.  Sliding board transfers throughout session with +2 assist.  Pt requests to work on UE exercises.  AAROM performed for bilat hands, shoulders and elbows with pt with improving hand strength bilat.  Grasp and release task with cups with pt able to perform independently with Rt hand, min facilitation required for Lt hand.  AAROM for bilat LEs with pt with improving quad and ankle strength bilat.  Pt left in room with family present, needs at hand.  Therapy Documentation Precautions:  Precautions Precautions: Fall, Cervical Precaution Comments: Reviewed cervical precautions Required Braces or Orthoses: Cervical Brace Cervical Brace: At all times, Hard collar Restrictions Weight Bearing Restrictions: No Pain: Pt with no c/o pain during session   Therapy/Group: Individual Therapy  Ladawna Walgren 02/18/2018, 12:17 PM

## 2018-02-18 NOTE — Plan of Care (Signed)
  Problem: RH Dressing Goal: LTG Patient will perform upper body dressing (OT) Description LTG Patient will perform upper body dressing with assist, with/without cues (OT). Outcome: Not Applicable Flowsheets (Taken 02/18/2018 1246) LTG: Pt will perform upper body dressing with assistance level of: -- (d/c 12/16) Note:  Dc goal 12/16 due to poor pt progress and pt not wanting to work on dressing Goal: LTG Patient will perform lower body dressing w/assist (OT) Description LTG: Patient will perform lower body dressing with assist, with/without cues in positioning using equipment (OT) Outcome: Not Applicable Flowsheets (Taken 02/18/2018 1246) LTG: Pt will perform lower body dressing with assistance level of: -- (d/c 12/16) Note:  Dc goal 12/16 due to poor pt progress and pt not wanting to work on dressing -ESD   Problem: RH Toileting Goal: LTG Patient will perform toileting task (3/3 steps) with assistance level (OT) Description LTG: Patient will perform toileting task (3/3 steps) with assistance level (OT)  Outcome: Not Applicable Flowsheets (Taken 02/18/2018 1246) LTG: Pt will perform toileting task (3/3 steps) with assistance level : -- (d/c 12/16) Note:  Dc goal 12/16 due to poor pt progress and pt not wanting to work on toileting and refuses bowel program   Problem: RH Toilet Transfers Goal: LTG Patient will perform toilet transfers w/assist (OT) Description LTG: Patient will perform toilet transfers with assist, with/without cues using equipment (OT) Outcome: Not Applicable Flowsheets (Taken 02/18/2018 1246) LTG: Pt will perform toilet transfers with assistance level of: -- (d/c 12/16) Note:  Dc goal 12/16 due to poor pt progress and pt not wanting to work on activity

## 2018-02-18 NOTE — Progress Notes (Signed)
Speech Language Pathology Weekly Progress and Session Note  Patient Details  Name: [name] [name] MRN: [id number] Date of Birth: June 30, 1940  Beginning of progress report period: February 08, 2018 End of progress report period: February 18, 2018  Today's Date: 02/18/2018 SLP Individual Time: 3846-6599 SLP Individual Time Calculation (min): 30 min  Short Term Goals: Week 1: SLP Short Term Goal 1 (Week 1): Pt will consume trials of regular textures and thin liquids with supervision cues for use of swallowing precautions SLP Short Term Goal 1 - Progress (Week 1): Discontinued (comment) SLP Short Term Goal 2 (Week 1): Pt will recall milldy complex, daily information with min cues for use of external aids.   SLP Short Term Goal 2 - Progress (Week 1): Not met    New Short Term Goals: Week 2: SLP Short Term Goal 1 (Week 2): Pt will recall milldy complex, daily information with Min cues for use of external aids.    Weekly Progress Updates: Patient has made minimal progress towards LTGs this reporting due to minimal participation. Patient is consuming minimal amounts of Dys. 3 textures and requested to stay on softer foods at this time, therefore, dysphagia goals discharged. Patient continues to require overall Min-Mod A verbal cues for recall of functional and daily information and most sessions are usually spent with patient asking clinician to reposition him constantly throughout session or discussing topics/concerns that are not related to cognitive impairments. Due to patient's intermittent participation in SLP intervention, treatment plan has been changed to 3X/week. Patient verbalized understanding and agreement. Patient would benefit from continued skilled SLP intervention to maximize cognitive functioning prior to discharge.      Intensity: Minumum of 1-2 x/day, 30 to 90 minutes Frequency: 1 to 3 out of 7 days Duration/Length of Stay: TBD due to SNF placement  Treatment/Interventions:  Cognitive remediation/compensation;Cueing hierarchy;Environmental controls;Internal/external aids;Patient/family education   Daily Session  Skilled Therapeutic Interventions: Skilled treatment session focused on cognitive goals. Patient continues to verbalize that he prefers his current diet of Dys. 3 textures and does not wish to upgrade at this time. Patient recalled events from previous therapy sessions with Min A verbal cues but continues to require Mod A verbal cues to recall current therapies and goals of skilled interventions. Overall, patient appeared more pleasant and engaged in treatment session. Patient left upright in bed with alarm on and all needs within reach. Continue with current plan of care.     Pain No/Denies Pain  Therapy/Group: Individual Therapy  [name] 02/18/2018, 3:36 PM

## 2018-02-18 NOTE — Progress Notes (Signed)
Social Work Patient ID: [name] PlumberDavid [name], male   DOB: [birth year], 77 y.o.   MRN: [id number]  [facility]  Pt and family have accepted this bed with hope to d/c tomorrow if insurance approves.  Team aware.  Treyshawn Muldrew, LCSW
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
## 2018-02-18 NOTE — Progress Notes (Signed)
Occupational Therapy Weekly Progress Note  Patient Details  Name: [name] [name] MRN: [id number] Date of Birth: 11-16-1940  Beginning of progress report period: February 08, 2018 End of progress report period: February 18, 2018  Today's Date: 02/18/2018 OT Individual Time: 1300-1415 OT Individual Time Calculation (min): 75 min    Patient has met 2 of 4 short term goals.  Pt is progressing slowly towards OT goals at this time. Pt often self-limiting in therapy, but has still demonstrated some progress in UB and LB strength. Pt does not like to use u-cuff or built up handles, but he is able to functional feed himself with guided assistance, however, self-limiting 2/2 poor frustration tolerance. Pt continues to need +2 assist for all slideboard transfers. Plan for pt to dc to SNF for continued OT in next venue of care.  Patient continues to demonstrate the following deficits: muscle weakness and muscle paralysis, impaired timing and sequencing, abnormal tone, unbalanced muscle activation, decreased coordination and decreased motor planning and decreased sitting balance, decreased standing balance, decreased postural control and decreased balance strategies and therefore will continue to benefit from skilled OT intervention to enhance overall performance with BADL and Reduce care partner burden.  Patient not progressing toward long term goals.  See goal revision..  Continue plan of care.  OT Short Term Goals Week 1:  OT Short Term Goal 1 (Week 1): Pt will utilize wrist support/u-cuff with mod assist to increase participation in self-feeding OT Short Term Goal 1 - Progress (Week 1): Met OT Short Term Goal 2 (Week 1): Pt will complete bed mobility with max assist of 1 caregiver to decrease burden of care with bathing/dressing OT Short Term Goal 2 - Progress (Week 1): Met OT Short Term Goal 3 (Week 1): Pt will obtain circle sitting in bed with mod assist to participate in self-care tasks OT Short Term  Goal 3 - Progress (Week 1): Not met OT Short Term Goal 4 (Week 1): Pt will complete slide board transfer bed <> w/c to prepare for transfers to toilet with max assist of 1 caregiver OT Short Term Goal 4 - Progress (Week 1): Not met Week 2:  OT Short Term Goal 1 (Week 2): LTG=STG 2/2 plan to dc to SNF  Skilled Therapeutic Interventions/Progress Updates:    Pt greeted seated in TIS wc and agreeable to OT treatment session. B LE strengthening with 3 sets of 10 knee extension, ankle pumps, seated hip flexion, hip adduction/abduction. Active assist for all exercises but improved leg strength. B UE strengthening and stretching exercises using NMR with weight bearing to bring pt through full ROM. Shoulder flexion, bicep curl, straight arm raise, wrist flex/ext, and grasp/release. Active assistance throughout exercises. R UE stronger than R. Worked on functional grasp and provided pt with built up foam handle for eating and grooming tasks-pt agreeable to try device. Toothbrushing completed at the sink using foam handle with pt still needing HOH to maintain grip when adding functional aspect. Total a +2 slideboard transfer back to bed. Pt left semi-reclined in bed with pillows placed and needs met. Nursing present to administer medications.   Therapy Documentation Precautions:  Precautions Precautions: Fall, Cervical Precaution Comments: Reviewed cervical precautions Required Braces or Orthoses: Cervical Brace Cervical Brace: At all times, Hard collar Restrictions Weight Bearing Restrictions: No Pain: Pain Assessment Pain Scale: 0-10 Pain Score: 4  Pain Type: Acute pain Pain Location: Neck Pain Orientation: Right;Left Pain Descriptors / Indicators: Discomfort  Therapy/Group: Individual Therapy  Valma Cava 02/18/2018,  3:10 PM

## 2018-02-19 ENCOUNTER — Inpatient Hospital Stay (HOSPITAL_COMMUNITY): Payer: Medicare PPO | Admitting: Physical Therapy

## 2018-02-19 ENCOUNTER — Inpatient Hospital Stay (HOSPITAL_COMMUNITY): Payer: Medicare PPO

## 2018-02-19 DIAGNOSIS — S12500D Unspecified displaced fracture of sixth cervical vertebra, subsequent encounter for fracture with routine healing: Secondary | ICD-10-CM | POA: Diagnosis not present

## 2018-02-19 DIAGNOSIS — A0472 Enterocolitis due to Clostridium difficile, not specified as recurrent: Secondary | ICD-10-CM | POA: Diagnosis not present

## 2018-02-19 DIAGNOSIS — S161XXA Strain of muscle, fascia and tendon at neck level, initial encounter: Secondary | ICD-10-CM | POA: Diagnosis not present

## 2018-02-19 DIAGNOSIS — N319 Neuromuscular dysfunction of bladder, unspecified: Secondary | ICD-10-CM | POA: Diagnosis not present

## 2018-02-19 DIAGNOSIS — F419 Anxiety disorder, unspecified: Secondary | ICD-10-CM | POA: Diagnosis not present

## 2018-02-19 DIAGNOSIS — S14109D Unspecified injury at unspecified level of cervical spinal cord, subsequent encounter: Secondary | ICD-10-CM | POA: Diagnosis not present

## 2018-02-19 DIAGNOSIS — G992 Myelopathy in diseases classified elsewhere: Secondary | ICD-10-CM | POA: Diagnosis not present

## 2018-02-19 DIAGNOSIS — I2510 Atherosclerotic heart disease of native coronary artery without angina pectoris: Secondary | ICD-10-CM | POA: Diagnosis not present

## 2018-02-19 DIAGNOSIS — N390 Urinary tract infection, site not specified: Secondary | ICD-10-CM | POA: Diagnosis not present

## 2018-02-19 DIAGNOSIS — S12591D Other nondisplaced fracture of sixth cervical vertebra, subsequent encounter for fracture with routine healing: Secondary | ICD-10-CM | POA: Diagnosis not present

## 2018-02-19 DIAGNOSIS — S14106S Unspecified injury at C6 level of cervical spinal cord, sequela: Secondary | ICD-10-CM | POA: Diagnosis not present

## 2018-02-19 DIAGNOSIS — G8250 Quadriplegia, unspecified: Secondary | ICD-10-CM | POA: Diagnosis not present

## 2018-02-19 DIAGNOSIS — G959 Disease of spinal cord, unspecified: Secondary | ICD-10-CM | POA: Diagnosis not present

## 2018-02-19 DIAGNOSIS — R05 Cough: Secondary | ICD-10-CM | POA: Diagnosis not present

## 2018-02-19 DIAGNOSIS — R251 Tremor, unspecified: Secondary | ICD-10-CM | POA: Diagnosis not present

## 2018-02-19 DIAGNOSIS — R042 Hemoptysis: Secondary | ICD-10-CM | POA: Diagnosis not present

## 2018-02-19 DIAGNOSIS — R197 Diarrhea, unspecified: Secondary | ICD-10-CM | POA: Diagnosis not present

## 2018-02-19 DIAGNOSIS — F4322 Adjustment disorder with anxiety: Secondary | ICD-10-CM | POA: Diagnosis not present

## 2018-02-19 MED ORDER — BISACODYL 10 MG RE SUPP: 10.0000 mg | Freq: Every day | RECTAL | 0 refills | Status: DC

## 2018-02-19 MED ORDER — POLYETHYLENE GLYCOL 3350 17 G PO PACK: 17.0000 g | PACK | Freq: Two times a day (BID) | ORAL | 0 refills | Status: DC

## 2018-02-19 MED ORDER — FLUTICASONE PROPIONATE 50 MCG/ACT NA SUSP: 1.0000 | Freq: Every day | NASAL | 2 refills | Status: DC

## 2018-02-19 MED ORDER — CEPHALEXIN 250 MG PO CAPS: 250.0000 mg | ORAL_CAPSULE | Freq: Three times a day (TID) | ORAL | Status: DC

## 2018-02-19 MED ORDER — GABAPENTIN 250 MG/5ML PO SOLN: 300.0000 mg | Freq: Three times a day (TID) | ORAL | 12 refills | Status: DC

## 2018-02-19 MED ORDER — ACETAMINOPHEN 325 MG PO TABS: 650.0000 mg | ORAL_TABLET | Freq: Four times a day (QID) | ORAL | Status: DC

## 2018-02-19 MED ORDER — OXYBUTYNIN CHLORIDE 5 MG PO TABS: 5.0000 mg | ORAL_TABLET | Freq: Two times a day (BID) | ORAL | Status: DC

## 2018-02-19 MED ORDER — OXYCODONE HCL 5 MG PO TABS: 5.0000 mg | ORAL_TABLET | ORAL | 0 refills | Status: DC | PRN

## 2018-02-19 MED ORDER — METHOCARBAMOL 500 MG PO TABS: 500.0000 mg | ORAL_TABLET | Freq: Three times a day (TID) | ORAL | 0 refills | Status: DC

## 2018-02-19 MED ORDER — PANTOPRAZOLE SODIUM 40 MG PO TBEC: 40.0000 mg | DELAYED_RELEASE_TABLET | Freq: Every day | ORAL | Status: DC

## 2018-02-19 MED ORDER — ENOXAPARIN SODIUM 40 MG/0.4ML ~~LOC~~ SOLN: 40.0000 mg | SUBCUTANEOUS | Status: DC

## 2018-02-19 MED ORDER — CHLORDIAZEPOXIDE HCL 10 MG PO CAPS: 10.0000 mg | ORAL_CAPSULE | Freq: Every evening | ORAL | 0 refills | Status: DC

## 2018-02-19 NOTE — Progress Notes (Signed)
Occupational Therapy Discharge Summary  Patient Details  Name: [name] [name] MRN: [id number] Date of Birth: November 13, 1940  Patient has met 2 of 5 long term goals due to functional use of  RIGHT upper and LEFT upper extremity.  Patient to discharge at overall Total Assist level.  Patient's care partner unavailable to provide the necessary physical assistance at discharge, therefore pt to dc to SNF for continued OT in next venue of care.  Reasons goals not met:  Pt unable to meet goals 2/2 dc to SNF. Pt continues to needed total A +2 for transfers, Max A sitting balance, and max A for bathing/dressing tasks.  Recommendation:  Patient will benefit from ongoing skilled OT services in skilled nursing facility setting to continue to advance functional skills in the area of Reduce care partner burden.  Equipment: No equipment provided  Reasons for discharge: discharge from hospital  Patient/family agrees with progress made and goals achieved: Yes  OT Discharge Precautions/Restrictions  Precautions Precautions: Fall;Cervical Required Braces or Orthoses: Cervical Brace Cervical Brace: At all times;Hard collar Restrictions Weight Bearing Restrictions: No ADL ADL Eating: Maximal assistance Grooming: Minimal assistance Upper Body Bathing: Moderate assistance Where Assessed-Upper Body Bathing: Bed level Lower Body Bathing: Maximal assistance Where Assessed-Lower Body Bathing: Bed level Upper Body Dressing: Maximal assistance Where Assessed-Upper Body Dressing: Bed level Lower Body Dressing: Dependent Where Assessed-Lower Body Dressing: Bed level Toileting: Dependent Where Assessed-Toileting: Bed level Cognition Overall Cognitive Status: Impaired/Different from baseline Arousal/Alertness: Awake/alert Orientation Level: Oriented X4 Memory: Impaired Memory Impairment: Decreased recall of new information Awareness: Impaired Awareness Impairment: Anticipatory impairment Problem Solving:  Impaired Reasoning: Impaired Behaviors: Verbal agitation Safety/Judgment: Impaired Sensation Sensation Light Touch: Impaired Detail Light Touch Impaired Details: Impaired RUE;Impaired LUE;Impaired RLE;Impaired LLE Proprioception: Impaired Detail Proprioception Impaired Details: Impaired RUE;Impaired LUE;Impaired RLE;Impaired LLE Coordination Gross Motor Movements are Fluid and Coordinated: No Fine Motor Movements are Fluid and Coordinated: No Coordination and Movement Description: impaired by incomplete tetraplegia Motor  Motor Motor: Tetraplegia Motor - Discharge Observations: incomplete SCI, LUE>RUE weakness, RLE>LLE weakness Balance Balance Balance Assessed: Yes Static Sitting Balance Static Sitting - Level of Assistance: 2: Max assist Dynamic Sitting Balance Dynamic Sitting - Level of Assistance: 1: +1 Total assist Extremity/Trunk Assessment RUE Assessment RUE Assessment: Exceptions to [facility] Active Range of Motion (AROM) Comments: shoulder 0-70, elbow flex/ext 3+/5 wrist/hand grip strength 2/5- impropved pinch LUE Assessment Active Range of Motion (AROM) Comments: shoulder 0-40, elbow flex/ext 3/5, wrist/hand grip strength 2/5- impropved pinch   Daneen Schick Yordan Martindale 02/19/2018, 4:03 PM
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
## 2018-02-19 NOTE — Progress Notes (Signed)
Speech Language Pathology Discharge Summary  Patient Details  Name: [name] [name] MRN: [id number] Date of Birth: [birth year]  Today's Date: 02/19/2018 SLP Individual Time:  -  Pt discharged and not present for treatment     Skilled Therapeutic Interventions: Pt not present for treatment session, discharged from hospital.      Patient has met 3 of 4 long term goals.  Patient to discharge at overall Supervision;Mod;Min level.  Reasons goals not met:   due to lack of participation and discharge.  Clinical Impression/Discharge Summary:    Patient has made minimal progress towards LTGs this reporting due to minimal participation. Patient is consuming minimal amounts of Dys. 3 textures and requested to stay on softer foods at this time, therefore, dysphagia goals discharged. Patient continues to require overall Min-Mod A verbal cues for recall of functional and daily information and most sessions are usually spent with patient asking clinician to reposition him constantly throughout session or discussing topics/concerns that are not related to cognitive impairments. Due to patient's intermittent participation in SLP intervention, treatment plan has been changed to 3X/week. Patient verbalized understanding and agreement. Patient would benefit from continued skilled SLP intervention to maximize cognitive functioning with continued skilled ST services at next level of care.   Care Partner:  Caregiver Able to Provide Assistance: Yes  Type of Caregiver Assistance: Physical;Cognitive  Recommendation:  Skilled Nursing facility  Rationale for SLP Follow Up: Maximize cognitive function and independence;Reduce caregiver burden   Equipment:   N/A  Reasons for discharge: Discharged from hospital   Patient/Family Agrees with Progress Made and Goals Achieved: Yes    Celina Shiley  [facility] 02/19/2018, 3:23 PM

## 2018-02-19 NOTE — Progress Notes (Signed)
Report given to John,RN at [facility]. Waiting for ambulance to pick up patient for transfer.

## 2018-02-19 NOTE — Progress Notes (Signed)
Physical Therapy Discharge Summary  Patient Details  Name: [name] [name] MRN: [id number] Date of Birth: 08/28/1940    Patient has met 0 of 4 long term goals due to discharge sooner than anticipated d/c date, decreased participation and compliance with therapy.  Patient to discharge at a wheelchair level Total Assist.     Reasons goals not met: discharge sooner than anticipated d/c date  Recommendation:  Patient will benefit from ongoing skilled PT services in skilled nursing facility setting to continue to advance safe functional mobility, address ongoing impairments in balance, transfers, strength, and minimize fall risk.  Equipment: No equipment provided  Reasons for discharge: discharge from hospital  Patient/family agrees with progress made and goals achieved: Yes  PT Discharge Precautions/Restrictions Precautions Precautions: Fall;Cervical Required Braces or Orthoses: Cervical Brace Cervical Brace: At all times;Hard collar Restrictions Weight Bearing Restrictions: No Pain Pain Assessment Pain Scale: 0-10 Pain Score: 0-No pain  Cognition Overall Cognitive Status: Impaired/Different from baseline Arousal/Alertness: Awake/alert Orientation Level: Oriented X4 Memory: Impaired Memory Impairment: Decreased recall of new information Awareness: Impaired Awareness Impairment: Anticipatory impairment Problem Solving: Impaired Reasoning: Impaired Behaviors: Verbal agitation Safety/Judgment: Impaired Sensation Sensation Light Touch: Impaired Detail Light Touch Impaired Details: Impaired RUE;Impaired LUE;Impaired RLE;Impaired LLE Proprioception: Impaired Detail Proprioception Impaired Details: Impaired RUE;Impaired LUE;Impaired RLE;Impaired LLE Coordination Gross Motor Movements are Fluid and Coordinated: No Fine Motor Movements are Fluid and Coordinated: No Coordination and Movement Description: impaired by incomplete tetraplegia Motor  Motor Motor:  Tetraplegia Motor - Discharge Observations: incomplete SCI, LUE>RUE weakness, RLE>LLE weakness  Mobility Bed Mobility Rolling Right: Maximal Assistance - Patient 25-49% Rolling Left: Maximal Assistance - Patient 25-49% Supine to Sit: Maximal Assistance - Patient - Patient 25-49% Transfers Lateral/Scoot Transfers: 2 Helpers Locomotion  Gait Ambulation: No Gait Gait: No Stairs / Additional Locomotion Stairs: No Wheelchair Mobility Wheelchair Mobility: No  Trunk/Postural Assessment  Cervical Assessment Cervical Assessment: (hard collar) Thoracic Assessment Thoracic Assessment: (rounded shoulders) Lumbar Assessment Lumbar Assessment: (posterior pelvic tilt) Postural Control Trunk Control: max A sitting balance Righting Reactions: absent Protective Responses: impaired  Balance Static Sitting Balance Static Sitting - Level of Assistance: 2: Max assist Dynamic Sitting Balance Dynamic Sitting - Level of Assistance: 1: +1 Total assist Extremity Assessment      RLE Strength Right Hip Flexion: 2+/5 Right Knee Flexion: 2/5 Right Knee Extension: 2+/5 Right Ankle Dorsiflexion: 2+/5 LLE Strength Left Hip Flexion: 3-/5 Left Knee Flexion: 3-/5 Left Knee Extension: 3-/5 Left Ankle Dorsiflexion: 3-/5    , 02/19/2018, 10:41 AM   

## 2018-02-19 NOTE — Progress Notes (Signed)
Social Work  Discharge Note  The overall goal for the admission was met for:   Discharge location: Yes - plan upon admission to CIR was for SNF  Length of Stay: Yes - 12 days  Discharge activity level: No  Home/community participation: No  Services provided included: MD, RD, PT, OT, RN, Pharmacy, [place]: [employer]  Follow-up services arranged: Other: SNF @ [facility]  Comments (or additional information):  Patient/Family verbalized understanding of follow-up arrangements: Yes  Individual responsible for coordination of the follow-up plan: pt  Confirmed correct DME delivered: NA  Jocelyne Reinertsen

## 2018-02-20 ENCOUNTER — Telehealth: Payer: Self-pay

## 2018-02-20 NOTE — Telephone Encounter (Signed)
I called SN[facility] inform them that pt has appt. No answer at the SNF

## 2018-02-21 DIAGNOSIS — R05 Cough: Secondary | ICD-10-CM | POA: Diagnosis not present

## 2018-02-21 DIAGNOSIS — F419 Anxiety disorder, unspecified: Secondary | ICD-10-CM | POA: Diagnosis not present

## 2018-02-21 DIAGNOSIS — R251 Tremor, unspecified: Secondary | ICD-10-CM | POA: Diagnosis not present

## 2018-02-21 DIAGNOSIS — S12500D Unspecified displaced fracture of sixth cervical vertebra, subsequent encounter for fracture with routine healing: Secondary | ICD-10-CM | POA: Diagnosis not present

## 2018-02-21 DIAGNOSIS — F4322 Adjustment disorder with anxiety: Secondary | ICD-10-CM | POA: Diagnosis not present

## 2018-02-21 NOTE — Telephone Encounter (Signed)
I have called SNF on 2 occassions, on 2 differents and no answer or voicemail

## 2018-02-26 DIAGNOSIS — N319 Neuromuscular dysfunction of bladder, unspecified: Secondary | ICD-10-CM | POA: Diagnosis not present

## 2018-02-26 DIAGNOSIS — I2510 Atherosclerotic heart disease of native coronary artery without angina pectoris: Secondary | ICD-10-CM | POA: Diagnosis not present

## 2018-02-26 DIAGNOSIS — R251 Tremor, unspecified: Secondary | ICD-10-CM | POA: Diagnosis not present

## 2018-02-26 DIAGNOSIS — G959 Disease of spinal cord, unspecified: Secondary | ICD-10-CM | POA: Diagnosis not present

## 2018-03-04 DIAGNOSIS — A0472 Enterocolitis due to Clostridium difficile, not specified as recurrent: Secondary | ICD-10-CM | POA: Diagnosis not present

## 2018-03-04 DIAGNOSIS — F419 Anxiety disorder, unspecified: Secondary | ICD-10-CM | POA: Diagnosis not present

## 2018-03-04 DIAGNOSIS — F4322 Adjustment disorder with anxiety: Secondary | ICD-10-CM | POA: Diagnosis not present

## 2018-03-04 DIAGNOSIS — S12500D Unspecified displaced fracture of sixth cervical vertebra, subsequent encounter for fracture with routine healing: Secondary | ICD-10-CM | POA: Diagnosis not present

## 2018-03-04 DIAGNOSIS — R197 Diarrhea, unspecified: Secondary | ICD-10-CM | POA: Diagnosis not present

## 2018-03-06 DIAGNOSIS — G3184 Mild cognitive impairment, so stated: Secondary | ICD-10-CM | POA: Diagnosis not present

## 2018-03-06 DIAGNOSIS — A0472 Enterocolitis due to Clostridium difficile, not specified as recurrent: Secondary | ICD-10-CM | POA: Diagnosis not present

## 2018-03-06 DIAGNOSIS — N390 Urinary tract infection, site not specified: Secondary | ICD-10-CM | POA: Diagnosis not present

## 2018-03-06 DIAGNOSIS — L89610 Pressure ulcer of right heel, unstageable: Secondary | ICD-10-CM | POA: Diagnosis not present

## 2018-03-06 DIAGNOSIS — G959 Disease of spinal cord, unspecified: Secondary | ICD-10-CM | POA: Diagnosis not present

## 2018-03-06 DIAGNOSIS — S161XXA Strain of muscle, fascia and tendon at neck level, initial encounter: Secondary | ICD-10-CM | POA: Diagnosis not present

## 2018-03-06 DIAGNOSIS — R031 Nonspecific low blood-pressure reading: Secondary | ICD-10-CM | POA: Diagnosis not present

## 2018-03-06 DIAGNOSIS — R110 Nausea: Secondary | ICD-10-CM | POA: Diagnosis not present

## 2018-03-06 DIAGNOSIS — F4322 Adjustment disorder with anxiety: Secondary | ICD-10-CM | POA: Diagnosis present

## 2018-03-06 DIAGNOSIS — R630 Anorexia: Secondary | ICD-10-CM | POA: Diagnosis not present

## 2018-03-06 DIAGNOSIS — R5381 Other malaise: Secondary | ICD-10-CM | POA: Diagnosis not present

## 2018-03-06 DIAGNOSIS — R627 Adult failure to thrive: Secondary | ICD-10-CM | POA: Diagnosis not present

## 2018-03-06 DIAGNOSIS — S12500D Unspecified displaced fracture of sixth cervical vertebra, subsequent encounter for fracture with routine healing: Secondary | ICD-10-CM | POA: Diagnosis not present

## 2018-03-06 DIAGNOSIS — S14109D Unspecified injury at unspecified level of cervical spinal cord, subsequent encounter: Secondary | ICD-10-CM | POA: Diagnosis not present

## 2018-03-06 DIAGNOSIS — G8250 Quadriplegia, unspecified: Secondary | ICD-10-CM | POA: Diagnosis present

## 2018-03-06 DIAGNOSIS — F4323 Adjustment disorder with mixed anxiety and depressed mood: Secondary | ICD-10-CM | POA: Diagnosis not present

## 2018-03-06 DIAGNOSIS — G992 Myelopathy in diseases classified elsewhere: Secondary | ICD-10-CM | POA: Diagnosis not present

## 2018-03-06 DIAGNOSIS — G250 Essential tremor: Secondary | ICD-10-CM | POA: Diagnosis not present

## 2018-03-06 DIAGNOSIS — M6281 Muscle weakness (generalized): Secondary | ICD-10-CM | POA: Diagnosis not present

## 2018-03-06 DIAGNOSIS — L89153 Pressure ulcer of sacral region, stage 3: Secondary | ICD-10-CM | POA: Diagnosis not present

## 2018-03-06 DIAGNOSIS — I2510 Atherosclerotic heart disease of native coronary artery without angina pectoris: Secondary | ICD-10-CM | POA: Diagnosis not present

## 2018-03-06 DIAGNOSIS — K592 Neurogenic bowel, not elsewhere classified: Secondary | ICD-10-CM | POA: Diagnosis present

## 2018-03-06 DIAGNOSIS — R5383 Other fatigue: Secondary | ICD-10-CM | POA: Diagnosis not present

## 2018-03-06 DIAGNOSIS — N319 Neuromuscular dysfunction of bladder, unspecified: Secondary | ICD-10-CM | POA: Diagnosis present

## 2018-03-06 DIAGNOSIS — L89150 Pressure ulcer of sacral region, unstageable: Secondary | ICD-10-CM | POA: Diagnosis not present

## 2018-03-06 DIAGNOSIS — F05 Delirium due to known physiological condition: Secondary | ICD-10-CM | POA: Diagnosis not present

## 2018-03-06 DIAGNOSIS — F4321 Adjustment disorder with depressed mood: Secondary | ICD-10-CM | POA: Diagnosis not present

## 2018-03-06 DIAGNOSIS — E860 Dehydration: Secondary | ICD-10-CM | POA: Diagnosis not present

## 2018-03-06 DIAGNOSIS — S12591D Other nondisplaced fracture of sixth cervical vertebra, subsequent encounter for fracture with routine healing: Secondary | ICD-10-CM | POA: Diagnosis not present

## 2018-03-07 DIAGNOSIS — L89150 Pressure ulcer of sacral region, unstageable: Secondary | ICD-10-CM | POA: Diagnosis not present

## 2018-03-14 DIAGNOSIS — L89150 Pressure ulcer of sacral region, unstageable: Secondary | ICD-10-CM | POA: Diagnosis not present

## 2018-03-19 DIAGNOSIS — R031 Nonspecific low blood-pressure reading: Secondary | ICD-10-CM | POA: Diagnosis not present

## 2018-03-19 DIAGNOSIS — A0472 Enterocolitis due to Clostridium difficile, not specified as recurrent: Secondary | ICD-10-CM | POA: Diagnosis not present

## 2018-03-19 DIAGNOSIS — E860 Dehydration: Secondary | ICD-10-CM | POA: Diagnosis not present

## 2018-03-19 DIAGNOSIS — M6281 Muscle weakness (generalized): Secondary | ICD-10-CM | POA: Diagnosis not present

## 2018-03-20 DIAGNOSIS — R627 Adult failure to thrive: Secondary | ICD-10-CM | POA: Diagnosis not present

## 2018-03-20 DIAGNOSIS — R630 Anorexia: Secondary | ICD-10-CM | POA: Diagnosis not present

## 2018-03-20 DIAGNOSIS — R110 Nausea: Secondary | ICD-10-CM | POA: Diagnosis not present

## 2018-03-20 DIAGNOSIS — R5383 Other fatigue: Secondary | ICD-10-CM | POA: Diagnosis not present

## 2018-03-21 DIAGNOSIS — E860 Dehydration: Secondary | ICD-10-CM | POA: Diagnosis not present

## 2018-03-21 DIAGNOSIS — R031 Nonspecific low blood-pressure reading: Secondary | ICD-10-CM | POA: Diagnosis not present

## 2018-03-21 DIAGNOSIS — N390 Urinary tract infection, site not specified: Secondary | ICD-10-CM | POA: Diagnosis not present

## 2018-03-21 DIAGNOSIS — R5381 Other malaise: Secondary | ICD-10-CM | POA: Diagnosis not present

## 2018-03-22 DIAGNOSIS — N390 Urinary tract infection, site not specified: Secondary | ICD-10-CM | POA: Diagnosis not present

## 2018-03-22 DIAGNOSIS — N319 Neuromuscular dysfunction of bladder, unspecified: Secondary | ICD-10-CM | POA: Diagnosis not present

## 2018-03-22 DIAGNOSIS — R5381 Other malaise: Secondary | ICD-10-CM | POA: Diagnosis not present

## 2018-03-22 DIAGNOSIS — R031 Nonspecific low blood-pressure reading: Secondary | ICD-10-CM | POA: Diagnosis not present

## 2018-03-22 DIAGNOSIS — E860 Dehydration: Secondary | ICD-10-CM | POA: Diagnosis not present

## 2018-03-27 ENCOUNTER — Encounter: Payer: Medicare PPO | Attending: Physical Medicine & Rehabilitation | Admitting: Physical Medicine & Rehabilitation

## 2018-03-27 ENCOUNTER — Encounter: Payer: Self-pay | Admitting: Physical Medicine & Rehabilitation

## 2018-03-27 VITALS — BP 88/54 | HR 108 | Resp 14

## 2018-03-27 DIAGNOSIS — N319 Neuromuscular dysfunction of bladder, unspecified: Secondary | ICD-10-CM | POA: Insufficient documentation

## 2018-03-27 DIAGNOSIS — K592 Neurogenic bowel, not elsewhere classified: Secondary | ICD-10-CM

## 2018-03-27 DIAGNOSIS — G8250 Quadriplegia, unspecified: Secondary | ICD-10-CM

## 2018-03-27 DIAGNOSIS — F4322 Adjustment disorder with anxiety: Secondary | ICD-10-CM | POA: Diagnosis not present

## 2018-03-27 NOTE — Patient Instructions (Signed)
PLEASE FEEL FREE TO CALL OUR OFFICE WITH ANY PROBLEMS OR QUESTIONS ([phone])      
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
## 2018-04-02 DIAGNOSIS — G959 Disease of spinal cord, unspecified: Secondary | ICD-10-CM | POA: Diagnosis not present

## 2018-04-04 DIAGNOSIS — M6281 Muscle weakness (generalized): Secondary | ICD-10-CM | POA: Diagnosis not present

## 2018-04-04 DIAGNOSIS — E860 Dehydration: Secondary | ICD-10-CM | POA: Diagnosis not present

## 2018-04-04 DIAGNOSIS — F05 Delirium due to known physiological condition: Secondary | ICD-10-CM | POA: Diagnosis not present

## 2018-04-04 DIAGNOSIS — L89610 Pressure ulcer of right heel, unstageable: Secondary | ICD-10-CM | POA: Diagnosis not present

## 2018-04-04 DIAGNOSIS — L89153 Pressure ulcer of sacral region, stage 3: Secondary | ICD-10-CM | POA: Diagnosis not present

## 2018-04-04 DIAGNOSIS — R031 Nonspecific low blood-pressure reading: Secondary | ICD-10-CM | POA: Diagnosis not present

## 2018-04-04 DIAGNOSIS — N319 Neuromuscular dysfunction of bladder, unspecified: Secondary | ICD-10-CM | POA: Diagnosis not present

## 2018-04-06 DIAGNOSIS — I2510 Atherosclerotic heart disease of native coronary artery without angina pectoris: Secondary | ICD-10-CM | POA: Diagnosis not present

## 2018-04-06 DIAGNOSIS — S12591D Other nondisplaced fracture of sixth cervical vertebra, subsequent encounter for fracture with routine healing: Secondary | ICD-10-CM | POA: Diagnosis not present

## 2018-04-06 DIAGNOSIS — G992 Myelopathy in diseases classified elsewhere: Secondary | ICD-10-CM | POA: Diagnosis not present

## 2018-04-06 DIAGNOSIS — G8250 Quadriplegia, unspecified: Secondary | ICD-10-CM | POA: Diagnosis not present

## 2018-04-06 DIAGNOSIS — S161XXA Strain of muscle, fascia and tendon at neck level, initial encounter: Secondary | ICD-10-CM | POA: Diagnosis not present

## 2018-04-06 DIAGNOSIS — S14109D Unspecified injury at unspecified level of cervical spinal cord, subsequent encounter: Secondary | ICD-10-CM | POA: Diagnosis not present

## 2018-04-06 DIAGNOSIS — N390 Urinary tract infection, site not specified: Secondary | ICD-10-CM | POA: Diagnosis not present

## 2018-04-06 DIAGNOSIS — S12500D Unspecified displaced fracture of sixth cervical vertebra, subsequent encounter for fracture with routine healing: Secondary | ICD-10-CM | POA: Diagnosis not present

## 2018-04-08 ENCOUNTER — Other Ambulatory Visit (HOSPITAL_COMMUNITY): Payer: Self-pay | Admitting: Neurological Surgery

## 2018-04-08 ENCOUNTER — Other Ambulatory Visit: Payer: Self-pay | Admitting: Neurological Surgery

## 2018-04-08 DIAGNOSIS — G959 Disease of spinal cord, unspecified: Secondary | ICD-10-CM

## 2018-04-09 DIAGNOSIS — M1712 Unilateral primary osteoarthritis, left knee: Secondary | ICD-10-CM | POA: Diagnosis not present

## 2018-04-09 DIAGNOSIS — S14109D Unspecified injury at unspecified level of cervical spinal cord, subsequent encounter: Secondary | ICD-10-CM | POA: Diagnosis not present

## 2018-04-09 DIAGNOSIS — S12591D Other nondisplaced fracture of sixth cervical vertebra, subsequent encounter for fracture with routine healing: Secondary | ICD-10-CM | POA: Diagnosis not present

## 2018-04-09 DIAGNOSIS — M6281 Muscle weakness (generalized): Secondary | ICD-10-CM | POA: Diagnosis not present

## 2018-04-09 DIAGNOSIS — G8250 Quadriplegia, unspecified: Secondary | ICD-10-CM | POA: Diagnosis not present

## 2018-04-10 DIAGNOSIS — M1712 Unilateral primary osteoarthritis, left knee: Secondary | ICD-10-CM | POA: Diagnosis not present

## 2018-04-10 DIAGNOSIS — S14109D Unspecified injury at unspecified level of cervical spinal cord, subsequent encounter: Secondary | ICD-10-CM | POA: Diagnosis not present

## 2018-04-10 DIAGNOSIS — S12591D Other nondisplaced fracture of sixth cervical vertebra, subsequent encounter for fracture with routine healing: Secondary | ICD-10-CM | POA: Diagnosis not present

## 2018-04-10 DIAGNOSIS — M6281 Muscle weakness (generalized): Secondary | ICD-10-CM | POA: Diagnosis not present

## 2018-04-10 DIAGNOSIS — G8250 Quadriplegia, unspecified: Secondary | ICD-10-CM | POA: Diagnosis not present

## 2018-04-11 DIAGNOSIS — L89610 Pressure ulcer of right heel, unstageable: Secondary | ICD-10-CM | POA: Diagnosis not present

## 2018-04-11 DIAGNOSIS — L89153 Pressure ulcer of sacral region, stage 3: Secondary | ICD-10-CM | POA: Diagnosis not present

## 2018-04-16 DIAGNOSIS — R682 Dry mouth, unspecified: Secondary | ICD-10-CM | POA: Diagnosis not present

## 2018-04-16 DIAGNOSIS — R627 Adult failure to thrive: Secondary | ICD-10-CM | POA: Diagnosis not present

## 2018-04-16 DIAGNOSIS — G8929 Other chronic pain: Secondary | ICD-10-CM | POA: Diagnosis not present

## 2018-04-16 DIAGNOSIS — M6281 Muscle weakness (generalized): Secondary | ICD-10-CM | POA: Diagnosis not present

## 2018-04-16 DIAGNOSIS — N319 Neuromuscular dysfunction of bladder, unspecified: Secondary | ICD-10-CM | POA: Diagnosis not present

## 2018-04-18 DIAGNOSIS — L89153 Pressure ulcer of sacral region, stage 3: Secondary | ICD-10-CM | POA: Diagnosis not present

## 2018-04-18 DIAGNOSIS — L89610 Pressure ulcer of right heel, unstageable: Secondary | ICD-10-CM | POA: Diagnosis not present

## 2018-04-19 ENCOUNTER — Ambulatory Visit (HOSPITAL_COMMUNITY)
Admission: RE | Admit: 2018-04-19 | Discharge: 2018-04-19 | Disposition: A | Discharge status: Home / Self Care | Payer: Medicare PPO | Source: Ambulatory Visit | Attending: Neurological Surgery | Admitting: Neurological Surgery

## 2018-04-19 DIAGNOSIS — S199XXD Unspecified injury of neck, subsequent encounter: Secondary | ICD-10-CM | POA: Diagnosis not present

## 2018-04-19 DIAGNOSIS — F4321 Adjustment disorder with depressed mood: Secondary | ICD-10-CM | POA: Diagnosis not present

## 2018-04-19 DIAGNOSIS — G3184 Mild cognitive impairment, so stated: Secondary | ICD-10-CM | POA: Diagnosis not present

## 2018-04-19 DIAGNOSIS — G959 Disease of spinal cord, unspecified: Secondary | ICD-10-CM | POA: Insufficient documentation

## 2018-04-22 ENCOUNTER — Emergency Department (HOSPITAL_COMMUNITY): Payer: Medicare PPO

## 2018-04-22 ENCOUNTER — Inpatient Hospital Stay (HOSPITAL_COMMUNITY)
Admission: EM | Admit: 2018-04-22 | Discharge: 2018-04-29 | DRG: 871 | Disposition: A | Discharge status: Hospice | Payer: Medicare PPO | Source: Skilled Nursing Facility | Attending: Internal Medicine | Admitting: Internal Medicine

## 2018-04-22 DIAGNOSIS — R6521 Severe sepsis with septic shock: Secondary | ICD-10-CM | POA: Diagnosis present

## 2018-04-22 DIAGNOSIS — R000 Tachycardia, unspecified: Secondary | ICD-10-CM | POA: Diagnosis not present

## 2018-04-22 DIAGNOSIS — E876 Hypokalemia: Secondary | ICD-10-CM | POA: Diagnosis not present

## 2018-04-22 DIAGNOSIS — Z66 Do not resuscitate: Secondary | ICD-10-CM | POA: Diagnosis not present

## 2018-04-22 DIAGNOSIS — J1520 Pneumonia due to staphylococcus, unspecified: Secondary | ICD-10-CM | POA: Diagnosis not present

## 2018-04-22 DIAGNOSIS — Z515 Encounter for palliative care: Secondary | ICD-10-CM | POA: Diagnosis not present

## 2018-04-22 DIAGNOSIS — R0603 Acute respiratory distress: Secondary | ICD-10-CM | POA: Diagnosis present

## 2018-04-22 DIAGNOSIS — Y95 Nosocomial condition: Secondary | ICD-10-CM | POA: Diagnosis present

## 2018-04-22 DIAGNOSIS — I252 Old myocardial infarction: Secondary | ICD-10-CM | POA: Diagnosis not present

## 2018-04-22 DIAGNOSIS — G8220 Paraplegia, unspecified: Secondary | ICD-10-CM | POA: Diagnosis present

## 2018-04-22 DIAGNOSIS — I10 Essential (primary) hypertension: Secondary | ICD-10-CM | POA: Diagnosis present

## 2018-04-22 DIAGNOSIS — L89600 Pressure ulcer of unspecified heel, unstageable: Secondary | ICD-10-CM | POA: Diagnosis not present

## 2018-04-22 DIAGNOSIS — A4159 Other Gram-negative sepsis: Secondary | ICD-10-CM | POA: Diagnosis present

## 2018-04-22 DIAGNOSIS — Z8249 Family history of ischemic heart disease and other diseases of the circulatory system: Secondary | ICD-10-CM

## 2018-04-22 DIAGNOSIS — A4101 Sepsis due to Methicillin susceptible Staphylococcus aureus: Secondary | ICD-10-CM | POA: Diagnosis present

## 2018-04-22 DIAGNOSIS — K219 Gastro-esophageal reflux disease without esophagitis: Secondary | ICD-10-CM | POA: Diagnosis present

## 2018-04-22 DIAGNOSIS — I959 Hypotension, unspecified: Secondary | ICD-10-CM | POA: Diagnosis not present

## 2018-04-22 DIAGNOSIS — Z79891 Long term (current) use of opiate analgesic: Secondary | ICD-10-CM

## 2018-04-22 DIAGNOSIS — Z955 Presence of coronary angioplasty implant and graft: Secondary | ICD-10-CM

## 2018-04-22 DIAGNOSIS — Z981 Arthrodesis status: Secondary | ICD-10-CM

## 2018-04-22 DIAGNOSIS — I2510 Atherosclerotic heart disease of native coronary artery without angina pectoris: Secondary | ICD-10-CM | POA: Diagnosis present

## 2018-04-22 DIAGNOSIS — Z87891 Personal history of nicotine dependence: Secondary | ICD-10-CM

## 2018-04-22 DIAGNOSIS — N179 Acute kidney failure, unspecified: Secondary | ICD-10-CM | POA: Diagnosis present

## 2018-04-22 DIAGNOSIS — R630 Anorexia: Secondary | ICD-10-CM | POA: Diagnosis not present

## 2018-04-22 DIAGNOSIS — R404 Transient alteration of awareness: Secondary | ICD-10-CM | POA: Diagnosis not present

## 2018-04-22 DIAGNOSIS — G8929 Other chronic pain: Secondary | ICD-10-CM | POA: Diagnosis present

## 2018-04-22 DIAGNOSIS — Z7982 Long term (current) use of aspirin: Secondary | ICD-10-CM

## 2018-04-22 DIAGNOSIS — H409 Unspecified glaucoma: Secondary | ICD-10-CM | POA: Diagnosis present

## 2018-04-22 DIAGNOSIS — J15211 Pneumonia due to Methicillin susceptible Staphylococcus aureus: Secondary | ICD-10-CM | POA: Diagnosis present

## 2018-04-22 DIAGNOSIS — Z96653 Presence of artificial knee joint, bilateral: Secondary | ICD-10-CM | POA: Diagnosis present

## 2018-04-22 DIAGNOSIS — L8990 Pressure ulcer of unspecified site, unspecified stage: Secondary | ICD-10-CM

## 2018-04-22 DIAGNOSIS — M170 Bilateral primary osteoarthritis of knee: Secondary | ICD-10-CM | POA: Diagnosis present

## 2018-04-22 DIAGNOSIS — E440 Moderate protein-calorie malnutrition: Secondary | ICD-10-CM | POA: Diagnosis present

## 2018-04-22 DIAGNOSIS — G252 Other specified forms of tremor: Secondary | ICD-10-CM | POA: Diagnosis present

## 2018-04-22 DIAGNOSIS — Z7901 Long term (current) use of anticoagulants: Secondary | ICD-10-CM

## 2018-04-22 DIAGNOSIS — R0902 Hypoxemia: Secondary | ICD-10-CM | POA: Diagnosis not present

## 2018-04-22 DIAGNOSIS — Z7951 Long term (current) use of inhaled steroids: Secondary | ICD-10-CM

## 2018-04-22 DIAGNOSIS — R464 Slowness and poor responsiveness: Secondary | ICD-10-CM | POA: Diagnosis not present

## 2018-04-22 DIAGNOSIS — R0682 Tachypnea, not elsewhere classified: Secondary | ICD-10-CM | POA: Diagnosis not present

## 2018-04-22 DIAGNOSIS — J9690 Respiratory failure, unspecified, unspecified whether with hypoxia or hypercapnia: Secondary | ICD-10-CM

## 2018-04-22 DIAGNOSIS — Z79899 Other long term (current) drug therapy: Secondary | ICD-10-CM

## 2018-04-22 DIAGNOSIS — L89156 Pressure-induced deep tissue damage of sacral region: Secondary | ICD-10-CM | POA: Diagnosis present

## 2018-04-22 DIAGNOSIS — E785 Hyperlipidemia, unspecified: Secondary | ICD-10-CM | POA: Diagnosis present

## 2018-04-22 DIAGNOSIS — J9601 Acute respiratory failure with hypoxia: Secondary | ICD-10-CM

## 2018-04-22 DIAGNOSIS — Z4659 Encounter for fitting and adjustment of other gastrointestinal appliance and device: Secondary | ICD-10-CM

## 2018-04-22 DIAGNOSIS — J189 Pneumonia, unspecified organism: Secondary | ICD-10-CM

## 2018-04-22 DIAGNOSIS — L89896 Pressure-induced deep tissue damage of other site: Secondary | ICD-10-CM | POA: Diagnosis present

## 2018-04-22 DIAGNOSIS — N390 Urinary tract infection, site not specified: Secondary | ICD-10-CM | POA: Diagnosis present

## 2018-04-22 DIAGNOSIS — L89610 Pressure ulcer of right heel, unstageable: Secondary | ICD-10-CM | POA: Diagnosis present

## 2018-04-22 DIAGNOSIS — Z792 Long term (current) use of antibiotics: Secondary | ICD-10-CM

## 2018-04-22 DIAGNOSIS — L89316 Pressure-induced deep tissue damage of right buttock: Secondary | ICD-10-CM | POA: Diagnosis present

## 2018-04-22 DIAGNOSIS — Z4682 Encounter for fitting and adjustment of non-vascular catheter: Secondary | ICD-10-CM | POA: Diagnosis not present

## 2018-04-22 DIAGNOSIS — A419 Sepsis, unspecified organism: Secondary | ICD-10-CM

## 2018-04-22 DIAGNOSIS — R627 Adult failure to thrive: Secondary | ICD-10-CM | POA: Diagnosis not present

## 2018-04-22 DIAGNOSIS — R6520 Severe sepsis without septic shock: Secondary | ICD-10-CM | POA: Diagnosis not present

## 2018-04-22 DIAGNOSIS — M6281 Muscle weakness (generalized): Secondary | ICD-10-CM | POA: Diagnosis not present

## 2018-04-22 DIAGNOSIS — R739 Hyperglycemia, unspecified: Secondary | ICD-10-CM | POA: Diagnosis present

## 2018-04-22 DIAGNOSIS — Z818 Family history of other mental and behavioral disorders: Secondary | ICD-10-CM

## 2018-04-22 DIAGNOSIS — R1310 Dysphagia, unspecified: Secondary | ICD-10-CM | POA: Diagnosis not present

## 2018-04-22 DIAGNOSIS — Z960 Presence of urogenital implants: Secondary | ICD-10-CM | POA: Diagnosis present

## 2018-04-22 LAB — LACTIC ACID, PLASMA: Lactic Acid, Venous: 2.1 mmol/L (ref 0.5–1.9)

## 2018-04-22 MED ORDER — VANCOMYCIN HCL 10 G IV SOLR
2000.0000 mg | Freq: Once | INTRAVENOUS | Status: AC
  Administered 2018-04-23: 2000 mg via INTRAVENOUS
  Filled 2018-04-22: qty 2000

## 2018-04-22 MED ORDER — NOREPINEPHRINE 4 MG/250ML-% IV SOLN
INTRAVENOUS | Status: AC
  Filled 2018-04-22: qty 250

## 2018-04-22 MED ORDER — METRONIDAZOLE IN NACL 5-0.79 MG/ML-% IV SOLN
500.0000 mg | Freq: Three times a day (TID) | INTRAVENOUS | Status: DC
  Administered 2018-04-23 – 2018-04-24 (×6): 500 mg via INTRAVENOUS
  Filled 2018-04-22 (×6): qty 100

## 2018-04-22 MED ORDER — ROCURONIUM BROMIDE 50 MG/5ML IV SOLN
INTRAVENOUS | Status: AC | PRN
  Administered 2018-04-22: 100 mg via INTRAVENOUS

## 2018-04-22 MED ORDER — SODIUM CHLORIDE 0.9 % IV BOLUS (SEPSIS)
1000.0000 mL | Freq: Once | INTRAVENOUS | Status: AC
  Administered 2018-04-22: 1000 mL via INTRAVENOUS

## 2018-04-22 MED ORDER — VANCOMYCIN HCL IN DEXTROSE 1-5 GM/200ML-% IV SOLN: 1000.0000 mg | Freq: Once | INTRAVENOUS | Status: DC

## 2018-04-22 MED ORDER — ETOMIDATE 2 MG/ML IV SOLN
INTRAVENOUS | Status: AC | PRN
  Administered 2018-04-22: 20 mg via INTRAVENOUS

## 2018-04-22 MED ORDER — NOREPINEPHRINE 4 MG/250ML-% IV SOLN
0.0000 ug/min | INTRAVENOUS | Status: DC
  Administered 2018-04-22: 20 ug/min via INTRAVENOUS
  Administered 2018-04-23: 7 ug/min via INTRAVENOUS
  Administered 2018-04-23: 9 ug/min via INTRAVENOUS
  Administered 2018-04-23: 4 ug/min via INTRAVENOUS
  Administered 2018-04-24: 6 ug/min via INTRAVENOUS
  Administered 2018-04-24: 7 ug/min via INTRAVENOUS
  Administered 2018-04-25: 4 ug/min via INTRAVENOUS
  Filled 2018-04-22 (×6): qty 250

## 2018-04-22 MED ORDER — PROPOFOL 1000 MG/100ML IV EMUL
INTRAVENOUS | Status: AC
  Filled 2018-04-22: qty 100

## 2018-04-22 MED ORDER — SODIUM CHLORIDE 0.9 % IV BOLUS (SEPSIS)
1000.0000 mL | Freq: Once | INTRAVENOUS | Status: AC
  Administered 2018-04-23: 1000 mL via INTRAVENOUS

## 2018-04-22 MED ORDER — PROPOFOL 1000 MG/100ML IV EMUL
5.0000 ug/kg/min | INTRAVENOUS | Status: DC
  Administered 2018-04-22: 15 ug/kg/min via INTRAVENOUS

## 2018-04-22 MED ORDER — SODIUM CHLORIDE 0.9 % IV SOLN
2.0000 g | Freq: Once | INTRAVENOUS | Status: AC
  Administered 2018-04-22: 2 g via INTRAVENOUS
  Filled 2018-04-22: qty 2

## 2018-04-22 NOTE — ED Triage Notes (Signed)
BIB EMS from [facility]. Per staff, pt "normal this afternoon" which is A&O x3. Now in respiratory failure with ventilation assisted by EMS. NPA in place with BVM @ 15Lpm providing mechanical ventilation. GCS 3. Foley in place from facility with cloudy urine in bag. Per facility paperwork, recorded axillary temp of 103.3 as of 1954 today.

## 2018-04-22 NOTE — ED Notes (Signed)
Intubation successful on first attempt. 21@lip  with 8.0 ETT.
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
## 2018-04-22 NOTE — ED Provider Notes (Signed)
Procedure Name: Intubation Date/Time: 04/22/2018 11:15 PM Performed by: Orson Aloe, MD Pre-anesthesia Checklist: Patient identified Oxygen Delivery Method: Ambu bag Preoxygenation: Pre-oxygenation with 100% oxygen Induction Type: Rapid sequence Ventilation: Mask ventilation without difficulty Laryngoscope Size: Mac Grade View: Grade I Tube size: 8.0 mm Number of attempts: 1 Airway Equipment and Method: Rigid stylet Placement Confirmation: ETT inserted through vocal cords under direct vision,  Positive ETCO2 and Breath sounds checked- equal and bilateral Secured at: 22 cm Tube secured with: ETT holder         Orson Aloe, MD 04/22/18 [id number]    Ward, Delice Bison, DO 04/22/18 [id number]

## 2018-04-23 ENCOUNTER — Other Ambulatory Visit: Payer: Self-pay

## 2018-04-23 ENCOUNTER — Inpatient Hospital Stay (HOSPITAL_COMMUNITY): Payer: Medicare PPO

## 2018-04-23 ENCOUNTER — Encounter (HOSPITAL_COMMUNITY): Payer: Self-pay | Admitting: Emergency Medicine

## 2018-04-23 ENCOUNTER — Inpatient Hospital Stay: Payer: Self-pay

## 2018-04-23 DIAGNOSIS — A419 Sepsis, unspecified organism: Secondary | ICD-10-CM | POA: Diagnosis not present

## 2018-04-23 DIAGNOSIS — R6521 Severe sepsis with septic shock: Secondary | ICD-10-CM

## 2018-04-23 DIAGNOSIS — N390 Urinary tract infection, site not specified: Secondary | ICD-10-CM | POA: Diagnosis present

## 2018-04-23 DIAGNOSIS — J9601 Acute respiratory failure with hypoxia: Secondary | ICD-10-CM

## 2018-04-23 DIAGNOSIS — I252 Old myocardial infarction: Secondary | ICD-10-CM | POA: Diagnosis not present

## 2018-04-23 DIAGNOSIS — J189 Pneumonia, unspecified organism: Secondary | ICD-10-CM | POA: Diagnosis not present

## 2018-04-23 DIAGNOSIS — G252 Other specified forms of tremor: Secondary | ICD-10-CM | POA: Diagnosis present

## 2018-04-23 DIAGNOSIS — L8990 Pressure ulcer of unspecified site, unspecified stage: Secondary | ICD-10-CM

## 2018-04-23 DIAGNOSIS — A4101 Sepsis due to Methicillin susceptible Staphylococcus aureus: Secondary | ICD-10-CM | POA: Diagnosis present

## 2018-04-23 DIAGNOSIS — E876 Hypokalemia: Secondary | ICD-10-CM | POA: Diagnosis not present

## 2018-04-23 DIAGNOSIS — R0603 Acute respiratory distress: Secondary | ICD-10-CM | POA: Diagnosis present

## 2018-04-23 DIAGNOSIS — J1520 Pneumonia due to staphylococcus, unspecified: Secondary | ICD-10-CM | POA: Diagnosis not present

## 2018-04-23 DIAGNOSIS — Y95 Nosocomial condition: Secondary | ICD-10-CM | POA: Diagnosis present

## 2018-04-23 DIAGNOSIS — A4159 Other Gram-negative sepsis: Secondary | ICD-10-CM | POA: Diagnosis present

## 2018-04-23 DIAGNOSIS — E440 Moderate protein-calorie malnutrition: Secondary | ICD-10-CM | POA: Diagnosis present

## 2018-04-23 DIAGNOSIS — N179 Acute kidney failure, unspecified: Secondary | ICD-10-CM | POA: Diagnosis present

## 2018-04-23 DIAGNOSIS — J15211 Pneumonia due to Methicillin susceptible Staphylococcus aureus: Secondary | ICD-10-CM | POA: Diagnosis not present

## 2018-04-23 DIAGNOSIS — K219 Gastro-esophageal reflux disease without esophagitis: Secondary | ICD-10-CM | POA: Diagnosis present

## 2018-04-23 DIAGNOSIS — E785 Hyperlipidemia, unspecified: Secondary | ICD-10-CM | POA: Diagnosis present

## 2018-04-23 DIAGNOSIS — G8220 Paraplegia, unspecified: Secondary | ICD-10-CM | POA: Diagnosis present

## 2018-04-23 DIAGNOSIS — L89316 Pressure-induced deep tissue damage of right buttock: Secondary | ICD-10-CM | POA: Diagnosis present

## 2018-04-23 DIAGNOSIS — L89600 Pressure ulcer of unspecified heel, unstageable: Secondary | ICD-10-CM | POA: Diagnosis not present

## 2018-04-23 DIAGNOSIS — Z66 Do not resuscitate: Secondary | ICD-10-CM | POA: Diagnosis not present

## 2018-04-23 DIAGNOSIS — L89156 Pressure-induced deep tissue damage of sacral region: Secondary | ICD-10-CM | POA: Diagnosis present

## 2018-04-23 DIAGNOSIS — Z515 Encounter for palliative care: Secondary | ICD-10-CM | POA: Diagnosis not present

## 2018-04-23 DIAGNOSIS — L89896 Pressure-induced deep tissue damage of other site: Secondary | ICD-10-CM | POA: Diagnosis present

## 2018-04-23 DIAGNOSIS — L89610 Pressure ulcer of right heel, unstageable: Secondary | ICD-10-CM | POA: Diagnosis present

## 2018-04-23 DIAGNOSIS — I2510 Atherosclerotic heart disease of native coronary artery without angina pectoris: Secondary | ICD-10-CM | POA: Diagnosis present

## 2018-04-23 DIAGNOSIS — I10 Essential (primary) hypertension: Secondary | ICD-10-CM | POA: Diagnosis present

## 2018-04-23 LAB — COMPREHENSIVE METABOLIC PANEL
ALT: 12 U/L (ref 0–44)
ANION GAP: 11 (ref 5–15)
AST: 24 U/L (ref 15–41)
Albumin: 2.3 g/dL — ABNORMAL LOW (ref 3.5–5.0)
Alkaline Phosphatase: 78 U/L (ref 38–126)
BILIRUBIN TOTAL: 1.6 mg/dL — AB (ref 0.3–1.2)
BUN: 17 mg/dL (ref 8–23)
CO2: 29 mmol/L (ref 22–32)
Calcium: 8 mg/dL — ABNORMAL LOW (ref 8.9–10.3)
Chloride: 95 mmol/L — ABNORMAL LOW (ref 98–111)
Creatinine, Ser: 1.46 mg/dL — ABNORMAL HIGH (ref 0.61–1.24)
GFR calc Af Amer: 54 mL/min — ABNORMAL LOW (ref >60)
GFR calc non Af Amer: 46 mL/min — ABNORMAL LOW (ref >60)
Glucose, Bld: 189 mg/dL — ABNORMAL HIGH (ref 70–99)
POTASSIUM: 4.2 mmol/L (ref 3.5–5.1)
Sodium: 135 mmol/L (ref 135–145)
TOTAL PROTEIN: 5.9 g/dL — AB (ref 6.5–8.1)

## 2018-04-23 LAB — URINALYSIS, ROUTINE W REFLEX MICROSCOPIC
Bilirubin Urine: NEGATIVE
Glucose, UA: NEGATIVE mg/dL
KETONES UR: 5 mg/dL — AB
Nitrite: NEGATIVE
PROTEIN: 100 mg/dL — AB
Specific Gravity, Urine: 1.015 (ref 1.005–1.030)
WBC, UA: >50 WBC/hpf — ABNORMAL HIGH (ref 0–5)
pH: 8 (ref 5.0–8.0)

## 2018-04-23 LAB — CBC
HCT: 46.2 % (ref 39.0–52.0)
Hemoglobin: 13.5 g/dL (ref 13.0–17.0)
MCH: 25.3 pg — AB (ref 26.0–34.0)
MCHC: 29.2 g/dL — ABNORMAL LOW (ref 30.0–36.0)
MCV: 86.7 fL (ref 80.0–100.0)
Platelets: 259 10*3/uL (ref 150–400)
RBC: 5.33 MIL/uL (ref 4.22–5.81)
RDW: 15.4 % (ref 11.5–15.5)
WBC: 16.3 10*3/uL — ABNORMAL HIGH (ref 4.0–10.5)
nRBC: 0 % (ref 0.0–0.2)

## 2018-04-23 LAB — HEMOGLOBIN AND HEMATOCRIT, BLOOD
HCT: 37.1 % — ABNORMAL LOW (ref 39.0–52.0)
Hemoglobin: 11.2 g/dL — ABNORMAL LOW (ref 13.0–17.0)

## 2018-04-23 LAB — CBC WITH DIFFERENTIAL/PLATELET
Abs Immature Granulocytes: 0.07 10*3/uL (ref 0.00–0.07)
BASOS PCT: 0 %
Basophils Absolute: 0 10*3/uL (ref 0.0–0.1)
Eosinophils Absolute: 0 10*3/uL (ref 0.0–0.5)
Eosinophils Relative: 0 %
HCT: 45 % (ref 39.0–52.0)
Hemoglobin: 13.7 g/dL (ref 13.0–17.0)
Immature Granulocytes: 1 %
Lymphocytes Relative: 16 %
Lymphs Abs: 2.1 10*3/uL (ref 0.7–4.0)
MCH: 25.4 pg — ABNORMAL LOW (ref 26.0–34.0)
MCHC: 30.4 g/dL (ref 30.0–36.0)
MCV: 83.5 fL (ref 80.0–100.0)
Monocytes Absolute: 0.7 10*3/uL (ref 0.1–1.0)
Monocytes Relative: 5 %
NEUTROS ABS: 10.7 10*3/uL — AB (ref 1.7–7.7)
Neutrophils Relative %: 78 %
PLATELETS: 296 10*3/uL (ref 150–400)
RBC: 5.39 MIL/uL (ref 4.22–5.81)
RDW: 15.5 % (ref 11.5–15.5)
WBC: 13.5 10*3/uL — ABNORMAL HIGH (ref 4.0–10.5)
nRBC: 0 % (ref 0.0–0.2)

## 2018-04-23 LAB — POCT I-STAT 7, (LYTES, BLD GAS, ICA,H+H)
Acid-Base Excess: 2 mmol/L (ref 0.0–2.0)
Bicarbonate: 25.8 mmol/L (ref 20.0–28.0)
Calcium, Ion: 1.01 mmol/L — ABNORMAL LOW (ref 1.15–1.40)
HCT: 35 % — ABNORMAL LOW (ref 39.0–52.0)
Hemoglobin: 11.9 g/dL — ABNORMAL LOW (ref 13.0–17.0)
O2 Saturation: 100 %
Patient temperature: 99.6
Potassium: 3.1 mmol/L — ABNORMAL LOW (ref 3.5–5.1)
Sodium: 137 mmol/L (ref 135–145)
TCO2: 27 mmol/L (ref 22–32)
pCO2 arterial: 38.6 mmHg (ref 32.0–48.0)
pH, Arterial: 7.436 (ref 7.350–7.450)
pO2, Arterial: 167 mmHg — ABNORMAL HIGH (ref 83.0–108.0)

## 2018-04-23 LAB — GLUCOSE, CAPILLARY
Glucose-Capillary: 113 mg/dL — ABNORMAL HIGH (ref 70–99)
Glucose-Capillary: 129 mg/dL — ABNORMAL HIGH (ref 70–99)
Glucose-Capillary: 132 mg/dL — ABNORMAL HIGH (ref 70–99)
Glucose-Capillary: 133 mg/dL — ABNORMAL HIGH (ref 70–99)
Glucose-Capillary: 167 mg/dL — ABNORMAL HIGH (ref 70–99)
Glucose-Capillary: 184 mg/dL — ABNORMAL HIGH (ref 70–99)

## 2018-04-23 LAB — MAGNESIUM
Magnesium: 1.8 mg/dL (ref 1.7–2.4)
Magnesium: 1.6 mg/dL — ABNORMAL LOW (ref 1.7–2.4)
Magnesium: 2 mg/dL (ref 1.7–2.4)

## 2018-04-23 LAB — BASIC METABOLIC PANEL
Anion gap: 14 (ref 5–15)
BUN: 15 mg/dL (ref 8–23)
CO2: 22 mmol/L (ref 22–32)
Calcium: 7.6 mg/dL — ABNORMAL LOW (ref 8.9–10.3)
Chloride: 103 mmol/L (ref 98–111)
Creatinine, Ser: 1.13 mg/dL (ref 0.61–1.24)
GFR calc Af Amer: >60 mL/min (ref >60)
GFR calc non Af Amer: >60 mL/min (ref >60)
Glucose, Bld: 187 mg/dL — ABNORMAL HIGH (ref 70–99)
Potassium: 3.5 mmol/L (ref 3.5–5.1)
Sodium: 139 mmol/L (ref 135–145)

## 2018-04-23 LAB — INFLUENZA PANEL BY PCR (TYPE A & B)
Influenza A By PCR: NEGATIVE
Influenza B By PCR: NEGATIVE

## 2018-04-23 LAB — POTASSIUM: Potassium: 2.6 mmol/L — CL (ref 3.5–5.1)

## 2018-04-23 LAB — LACTIC ACID, PLASMA
Lactic Acid, Venous: 2.7 mmol/L (ref 0.5–1.9)
Lactic Acid, Venous: 5.4 mmol/L (ref 0.5–1.9)
Lactic Acid, Venous: 3.4 mmol/L (ref 0.5–1.9)

## 2018-04-23 LAB — MRSA PCR SCREENING: MRSA BY PCR: NEGATIVE

## 2018-04-23 LAB — PHOSPHORUS
PHOSPHORUS: 1.7 mg/dL — AB (ref 2.5–4.6)
Phosphorus: 3 mg/dL (ref 2.5–4.6)
Phosphorus: <1 mg/dL — CL (ref 2.5–4.6)

## 2018-04-23 LAB — PROCALCITONIN: Procalcitonin: 2.08 ng/mL

## 2018-04-23 LAB — CORTISOL: Cortisol, Plasma: 23 ug/dL

## 2018-04-23 MED ORDER — INSULIN ASPART 100 UNIT/ML ~~LOC~~ SOLN
0.0000 [IU] | SUBCUTANEOUS | Status: DC
  Administered 2018-04-23: 2 [IU] via SUBCUTANEOUS
  Administered 2018-04-23: 3 [IU] via SUBCUTANEOUS
  Administered 2018-04-23: 2 [IU] via SUBCUTANEOUS
  Administered 2018-04-23: 3 [IU] via SUBCUTANEOUS
  Administered 2018-04-23: 2 [IU] via SUBCUTANEOUS
  Administered 2018-04-24: 3 [IU] via SUBCUTANEOUS
  Administered 2018-04-24: 2 [IU] via SUBCUTANEOUS
  Administered 2018-04-24 (×2): 3 [IU] via SUBCUTANEOUS
  Administered 2018-04-24: 2 [IU] via SUBCUTANEOUS
  Administered 2018-04-24: 3 [IU] via SUBCUTANEOUS
  Administered 2018-04-25 (×2): 2 [IU] via SUBCUTANEOUS

## 2018-04-23 MED ORDER — FENTANYL BOLUS VIA INFUSION
25.0000 ug | INTRAVENOUS | Status: DC | PRN
  Filled 2018-04-23: qty 25

## 2018-04-23 MED ORDER — SODIUM CHLORIDE 0.9 % IV SOLN
1.0000 g | Freq: Two times a day (BID) | INTRAVENOUS | Status: DC
  Filled 2018-04-23: qty 1

## 2018-04-23 MED ORDER — GABAPENTIN 250 MG/5ML PO SOLN
200.0000 mg | Freq: Three times a day (TID) | ORAL | Status: DC
  Administered 2018-04-23 – 2018-04-26 (×7): 200 mg
  Filled 2018-04-23 (×13): qty 4

## 2018-04-23 MED ORDER — LACTATED RINGERS IV BOLUS: 500.0000 mL | Freq: Once | INTRAVENOUS | Status: DC

## 2018-04-23 MED ORDER — ALBUTEROL SULFATE (2.5 MG/3ML) 0.083% IN NEBU: 2.5000 mg | INHALATION_SOLUTION | RESPIRATORY_TRACT | Status: DC | PRN

## 2018-04-23 MED ORDER — VANCOMYCIN HCL 10 G IV SOLR
1250.0000 mg | INTRAVENOUS | Status: DC
  Administered 2018-04-24 – 2018-04-25 (×2): 1250 mg via INTRAVENOUS
  Filled 2018-04-23 (×2): qty 1250

## 2018-04-23 MED ORDER — LACTATED RINGERS IV BOLUS
500.0000 mL | Freq: Once | INTRAVENOUS | Status: AC
  Administered 2018-04-23: 500 mL via INTRAVENOUS

## 2018-04-23 MED ORDER — BISACODYL 10 MG RE SUPP: 10.0000 mg | Freq: Every day | RECTAL | Status: DC | PRN

## 2018-04-23 MED ORDER — MIDAZOLAM HCL 2 MG/2ML IJ SOLN
1.0000 mg | INTRAMUSCULAR | Status: DC | PRN
  Administered 2018-04-23 – 2018-04-24 (×2): 1 mg via INTRAVENOUS
  Filled 2018-04-23 (×2): qty 2

## 2018-04-23 MED ORDER — VITAL HIGH PROTEIN PO LIQD: 1000.0000 mL | ORAL | Status: DC

## 2018-04-23 MED ORDER — ONDANSETRON HCL 4 MG/2ML IJ SOLN: 4.0000 mg | Freq: Four times a day (QID) | INTRAMUSCULAR | Status: DC | PRN

## 2018-04-23 MED ORDER — SODIUM CHLORIDE 0.9 % IV SOLN
2.0000 g | Freq: Two times a day (BID) | INTRAVENOUS | Status: DC
  Administered 2018-04-23 – 2018-04-26 (×8): 2 g via INTRAVENOUS
  Filled 2018-04-23 (×9): qty 2

## 2018-04-23 MED ORDER — CHLORHEXIDINE GLUCONATE 0.12% ORAL RINSE (MEDLINE KIT)
15.0000 mL | Freq: Two times a day (BID) | OROMUCOSAL | Status: DC
  Administered 2018-04-23 – 2018-04-25 (×5): 15 mL via OROMUCOSAL

## 2018-04-23 MED ORDER — SODIUM CHLORIDE 0.9% FLUSH
10.0000 mL | Freq: Two times a day (BID) | INTRAVENOUS | Status: DC
  Administered 2018-04-23: 10 mL
  Administered 2018-04-24 – 2018-04-25 (×2): 30 mL
  Administered 2018-04-25 – 2018-04-26 (×2): 10 mL
  Administered 2018-04-27: 20 mL
  Administered 2018-04-28: 30 mL
  Administered 2018-04-29: 10 mL

## 2018-04-23 MED ORDER — ACETAMINOPHEN 325 MG PO TABS
650.0000 mg | ORAL_TABLET | ORAL | Status: DC | PRN
  Administered 2018-04-23 – 2018-04-24 (×4): 650 mg via ORAL
  Filled 2018-04-23 (×4): qty 2

## 2018-04-23 MED ORDER — SODIUM CHLORIDE 0.9 % IV BOLUS
1000.0000 mL | Freq: Once | INTRAVENOUS | Status: AC
  Administered 2018-04-23: 1000 mL via INTRAVENOUS

## 2018-04-23 MED ORDER — METHOCARBAMOL 500 MG PO TABS
500.0000 mg | ORAL_TABLET | Freq: Three times a day (TID) | ORAL | Status: DC
  Administered 2018-04-23 – 2018-04-25 (×6): 500 mg
  Filled 2018-04-23 (×8): qty 1

## 2018-04-23 MED ORDER — SODIUM CHLORIDE 0.9 % IV SOLN
INTRAVENOUS | Status: DC | PRN
  Administered 2018-04-23 (×2): via INTRAVENOUS

## 2018-04-23 MED ORDER — SODIUM CHLORIDE 0.9% FLUSH: 10.0000 mL | INTRAVENOUS | Status: DC | PRN

## 2018-04-23 MED ORDER — SODIUM CHLORIDE 0.9 % IV SOLN
INTRAVENOUS | Status: DC
  Administered 2018-04-23 (×2): via INTRAVENOUS

## 2018-04-23 MED ORDER — DOCUSATE SODIUM 50 MG/5ML PO LIQD
100.0000 mg | Freq: Two times a day (BID) | ORAL | Status: DC | PRN
  Filled 2018-04-23: qty 10

## 2018-04-23 MED ORDER — PRO-STAT SUGAR FREE PO LIQD: 30.0000 mL | Freq: Two times a day (BID) | ORAL | Status: DC

## 2018-04-23 MED ORDER — VITAL AF 1.2 CAL PO LIQD
1000.0000 mL | ORAL | Status: DC
  Administered 2018-04-23 – 2018-04-25 (×3): 1000 mL
  Filled 2018-04-23 (×2): qty 1000

## 2018-04-23 MED ORDER — LACTATED RINGERS IV SOLN
INTRAVENOUS | Status: DC
  Administered 2018-04-23 – 2018-04-26 (×11): via INTRAVENOUS

## 2018-04-23 MED ORDER — POTASSIUM PHOSPHATES 15 MMOLE/5ML IV SOLN
30.0000 mmol | Freq: Once | INTRAVENOUS | Status: AC
  Administered 2018-04-23: 30 mmol via INTRAVENOUS
  Filled 2018-04-23: qty 10

## 2018-04-23 MED ORDER — POTASSIUM CHLORIDE 20 MEQ/15ML (10%) PO SOLN
20.0000 meq | Freq: Once | ORAL | Status: AC
  Administered 2018-04-23: 20 meq
  Filled 2018-04-23: qty 15

## 2018-04-23 MED ORDER — FENTANYL CITRATE (PF) 100 MCG/2ML IJ SOLN
50.0000 ug | Freq: Once | INTRAMUSCULAR | Status: AC
  Administered 2018-04-23: 50 ug via INTRAVENOUS
  Filled 2018-04-23: qty 2

## 2018-04-23 MED ORDER — MAGNESIUM SULFATE 2 GM/50ML IV SOLN
2.0000 g | Freq: Once | INTRAVENOUS | Status: AC
  Administered 2018-04-23: 2 g via INTRAVENOUS
  Filled 2018-04-23: qty 50

## 2018-04-23 MED ORDER — ENOXAPARIN SODIUM 40 MG/0.4ML ~~LOC~~ SOLN
40.0000 mg | SUBCUTANEOUS | Status: DC
  Administered 2018-04-23 – 2018-04-28 (×6): 40 mg via SUBCUTANEOUS
  Filled 2018-04-23 (×6): qty 0.4

## 2018-04-23 MED ORDER — CHLORHEXIDINE GLUCONATE CLOTH 2 % EX PADS
6.0000 | MEDICATED_PAD | Freq: Every day | CUTANEOUS | Status: DC
  Administered 2018-04-24 – 2018-04-27 (×5): 6 via TOPICAL

## 2018-04-23 MED ORDER — PANTOPRAZOLE SODIUM 40 MG IV SOLR: 40.0000 mg | Freq: Every day | INTRAVENOUS | Status: DC

## 2018-04-23 MED ORDER — ORAL CARE MOUTH RINSE
15.0000 mL | OROMUCOSAL | Status: DC
  Administered 2018-04-23 – 2018-04-26 (×25): 15 mL via OROMUCOSAL

## 2018-04-23 MED ORDER — PANTOPRAZOLE SODIUM 40 MG PO PACK
40.0000 mg | PACK | Freq: Every day | ORAL | Status: DC
  Administered 2018-04-23 – 2018-04-25 (×3): 40 mg
  Filled 2018-04-23 (×3): qty 20

## 2018-04-23 MED ORDER — ACETAMINOPHEN 650 MG RE SUPP
650.0000 mg | Freq: Once | RECTAL | Status: AC
  Administered 2018-04-23: 650 mg via RECTAL
  Filled 2018-04-23: qty 1

## 2018-04-23 MED ORDER — MIDAZOLAM HCL 2 MG/2ML IJ SOLN: 1.0000 mg | INTRAMUSCULAR | Status: DC | PRN

## 2018-04-23 MED ORDER — FENTANYL 2500MCG IN NS 250ML (10MCG/ML) PREMIX INFUSION
25.0000 ug/h | INTRAVENOUS | Status: DC
  Administered 2018-04-23: 200 ug/h via INTRAVENOUS
  Administered 2018-04-23: 25 ug/h via INTRAVENOUS
  Administered 2018-04-24: 100 ug/h via INTRAVENOUS
  Filled 2018-04-23 (×3): qty 250

## 2018-04-23 MED ORDER — POLYETHYLENE GLYCOL 3350 17 G PO PACK
17.0000 g | PACK | Freq: Every day | ORAL | Status: DC
  Administered 2018-04-23: 17 g
  Filled 2018-04-23 (×4): qty 1

## 2018-04-23 NOTE — Progress Notes (Signed)
eLink Physician-Brief Progress Note Patient Name: [name] [name] DOB: [birth year] MRN: [id number]   Date of Service  04/23/2018  HPI/Events of Note  K+ = 3.5 replaced earlier with 20 meq KCl.   eICU Interventions  Will order: 1. Recheck K+ prior to administering K3PO4.      Intervention Category Major Interventions: Electrolyte abnormality - evaluation and management  Sommer,Steven Eugene 04/23/2018, 10:26 PM
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
## 2018-04-23 NOTE — Progress Notes (Signed)
Peripherally Inserted Central Catheter/Midline Placement  The IV Nurse has discussed with the patient and/or persons authorized to consent for the patient, the purpose of this procedure and the potential benefits and risks involved with this procedure.  The benefits include less needle sticks, lab draws from the catheter, and the patient may be discharged home with the catheter. Risks include, but not limited to, infection, bleeding, blood clot (thrombus formation), and puncture of an artery; nerve damage and irregular heartbeat and possibility to perform a PICC exchange if needed/ordered by physician.  Alternatives to this procedure were also discussed.  Bard Power PICC patient education guide, fact sheet on infection prevention and patient information card has been provided to patient /or left at bedside.    PICC/Midline Placement Documentation  PICC Triple Lumen 04/23/18 PICC Right Brachial 39 cm 0 cm (Active)  Indication for Insertion or Continuance of Line Chronic illness with exacerbations (CF, Sickle Cell, etc.) 04/23/2018  8:27 PM  Exposed Catheter (cm) 0 cm 04/23/2018  8:27 PM  Site Assessment Clean;Dry;Intact 04/23/2018  8:27 PM  Lumen #1 Status Blood return noted;Flushed;Saline locked 04/23/2018  8:27 PM  Lumen #2 Status Blood return noted;Flushed;Saline locked 04/23/2018  8:27 PM  Lumen #3 Status Blood return noted;Flushed;Saline locked 04/23/2018  8:27 PM  Dressing Type Gauze;Occlusive;Securing device 04/23/2018  8:27 PM  Dressing Status Clean;Dry;Intact;Antimicrobial disc in place 04/23/2018  8:27 PM  Line Adjustment (NICU/IV Team Only) No 04/23/2018  8:27 PM  Dressing Intervention New dressing 04/23/2018  8:27 PM  Dressing Change Due 04/25/18 04/23/2018  8:27 PM       Netta Corrigan L 04/23/2018, 8:52 PM
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
## 2018-04-23 NOTE — Progress Notes (Signed)
eLink Physician-Brief Progress Note Patient Name: [name] [name] DOB: Jun 14, 1940 MRN: [id number]   Date of Service  04/23/2018  HPI/Events of Note  Request to change chronic Foley catheter. Urine from current Foley appears to be infected.   eICU Interventions  Will order: 1. Change Foley catheter.      Intervention Category Major Interventions: Infection - evaluation and management  Sommer,Steven Eugene 04/23/2018, 3:46 AM

## 2018-04-23 NOTE — Progress Notes (Signed)
Patient transported from CT to 3M08 without any issues. Pt. Stable. Report given to receiving RT>

## 2018-04-23 NOTE — ED Notes (Signed)
Patient transported to CT 

## 2018-04-23 NOTE — Progress Notes (Signed)
Pharmacy Antibiotic Note  [name] [name] is a 78 y.o. male admitted on 04/22/2018 with sepsis.  Pharmacy has been consulted for Vancomycin/Cefepime dosing. Found unresponsive/agonal respirations at nursing facility. WBC 13.5. Noted renal dysfunction.   Plan: Vancomycin 2000 mg IV x 1, then give 1250 mg IV q24h >>Estimated AUC:  Cefepime 1g IV q12h Trend WBC, temp, renal function  F/U infectious work-up Drug levels as indicated   Height: 5\' 8"  (172.7 cm) Weight: 189 lb 9.5 oz (86 kg) IBW/kg (Calculated) : 68.4  Temp (24hrs), Avg:101.3 F (38.5 C), Min:100.9 F (38.3 C), Max:101.7 F (38.7 C)  Recent Labs  Lab 04/22/18 2319  WBC 13.5*  CREATININE 1.46*  LATICACIDVEN 2.1*    Estimated Creatinine Clearance: 46.6 mL/min (A) (by C-G formula based on SCr of 1.46 mg/dL (H)).    No Known Allergies   Abran Duke 04/23/2018 1:31 AM
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
## 2018-04-23 NOTE — Progress Notes (Signed)
eLink Physician-Brief Progress Note Patient Name: [name] [name] DOB: March 20, 1940 MRN: [id number]   Date of Service  04/23/2018  HPI/Events of Note  K+ = 3.5, PO4--- < 1.0 and Creatinine = 1.13.  eICU Interventions  Will replace K+ and PO4---.     Intervention Category Major Interventions: Electrolyte abnormality - evaluation and management  [name] [name] 04/23/2018, 9:43 PM

## 2018-04-23 NOTE — Progress Notes (Signed)
Patient transported to CT 

## 2018-04-23 NOTE — Progress Notes (Signed)
CRITICAL VALUE ALERT  Critical Value:  Phos <1.0  Date & Time Notied:  04/23/18 @ 2130  Provider Notified: Pola Corn MD  Orders Received/Actions taken: MD notified; new orders

## 2018-04-23 NOTE — Progress Notes (Signed)
CRITICAL VALUE ALERT  Critical Value:  K 2.6  Date & Time Notied:  04/23/18 @ 2252  Provider Notified: Dr Arsenio Loader  Orders Received/Actions taken: Pola Corn notified

## 2018-04-24 ENCOUNTER — Inpatient Hospital Stay (HOSPITAL_COMMUNITY): Payer: Medicare PPO

## 2018-04-24 DIAGNOSIS — L89600 Pressure ulcer of unspecified heel, unstageable: Secondary | ICD-10-CM

## 2018-04-24 LAB — CBC
HCT: 34.4 % — ABNORMAL LOW (ref 39.0–52.0)
Hemoglobin: 10.2 g/dL — ABNORMAL LOW (ref 13.0–17.0)
MCH: 25.2 pg — ABNORMAL LOW (ref 26.0–34.0)
MCHC: 29.7 g/dL — ABNORMAL LOW (ref 30.0–36.0)
MCV: 84.9 fL (ref 80.0–100.0)
Platelets: 213 10*3/uL (ref 150–400)
RBC: 4.05 MIL/uL — ABNORMAL LOW (ref 4.22–5.81)
RDW: 15.4 % (ref 11.5–15.5)
WBC: 12.1 10*3/uL — ABNORMAL HIGH (ref 4.0–10.5)
nRBC: 0 % (ref 0.0–0.2)

## 2018-04-24 LAB — BASIC METABOLIC PANEL
Anion gap: 9 (ref 5–15)
BUN: 9 mg/dL (ref 8–23)
CO2: 24 mmol/L (ref 22–32)
Calcium: 7.4 mg/dL — ABNORMAL LOW (ref 8.9–10.3)
Chloride: 106 mmol/L (ref 98–111)
Creatinine, Ser: 0.58 mg/dL — ABNORMAL LOW (ref 0.61–1.24)
GFR calc Af Amer: >60 mL/min (ref >60)
GFR calc non Af Amer: >60 mL/min (ref >60)
Glucose, Bld: 178 mg/dL — ABNORMAL HIGH (ref 70–99)
Potassium: 2.6 mmol/L — CL (ref 3.5–5.1)
Sodium: 139 mmol/L (ref 135–145)

## 2018-04-24 LAB — GLUCOSE, CAPILLARY
Glucose-Capillary: 132 mg/dL — ABNORMAL HIGH (ref 70–99)
Glucose-Capillary: 133 mg/dL — ABNORMAL HIGH (ref 70–99)
Glucose-Capillary: 154 mg/dL — ABNORMAL HIGH (ref 70–99)
Glucose-Capillary: 154 mg/dL — ABNORMAL HIGH (ref 70–99)
Glucose-Capillary: 167 mg/dL — ABNORMAL HIGH (ref 70–99)
Glucose-Capillary: 180 mg/dL — ABNORMAL HIGH (ref 70–99)

## 2018-04-24 LAB — LACTIC ACID, PLASMA: Lactic Acid, Venous: 2.4 mmol/L (ref 0.5–1.9)

## 2018-04-24 LAB — PHOSPHORUS
Phosphorus: 2.2 mg/dL — ABNORMAL LOW (ref 2.5–4.6)
Phosphorus: 1.1 mg/dL — ABNORMAL LOW (ref 2.5–4.6)

## 2018-04-24 LAB — POTASSIUM: Potassium: 3.5 mmol/L (ref 3.5–5.1)

## 2018-04-24 LAB — MAGNESIUM
Magnesium: 1.9 mg/dL (ref 1.7–2.4)
Magnesium: 1.5 mg/dL — ABNORMAL LOW (ref 1.7–2.4)

## 2018-04-24 LAB — PROCALCITONIN: Procalcitonin: 3.32 ng/mL

## 2018-04-24 MED ORDER — FENTANYL CITRATE (PF) 100 MCG/2ML IJ SOLN
50.0000 ug | INTRAMUSCULAR | Status: DC | PRN
  Administered 2018-04-24 – 2018-04-25 (×4): 50 ug via INTRAVENOUS
  Filled 2018-04-24 (×4): qty 2

## 2018-04-24 MED ORDER — MAGNESIUM SULFATE 2 GM/50ML IV SOLN
2.0000 g | Freq: Once | INTRAVENOUS | Status: AC
  Administered 2018-04-24: 2 g via INTRAVENOUS
  Filled 2018-04-24: qty 50

## 2018-04-24 MED ORDER — SODIUM PHOSPHATES 45 MMOLE/15ML IV SOLN
10.0000 mmol | Freq: Once | INTRAVENOUS | Status: AC
  Administered 2018-04-24: 10 mmol via INTRAVENOUS
  Filled 2018-04-24: qty 3.33

## 2018-04-24 MED ORDER — CHLORDIAZEPOXIDE HCL 5 MG PO CAPS
10.0000 mg | ORAL_CAPSULE | Freq: Every day | ORAL | Status: DC
  Administered 2018-04-24: 10 mg
  Filled 2018-04-24 (×2): qty 2

## 2018-04-24 MED ORDER — COLLAGENASE 250 UNIT/GM EX OINT
TOPICAL_OINTMENT | Freq: Every day | CUTANEOUS | Status: DC
  Administered 2018-04-24: 1 via TOPICAL
  Administered 2018-04-25 – 2018-04-26 (×2): via TOPICAL
  Filled 2018-04-24: qty 30

## 2018-04-24 MED ORDER — SODIUM PHOSPHATES 45 MMOLE/15ML IV SOLN
30.0000 mmol | Freq: Once | INTRAVENOUS | Status: AC
  Administered 2018-04-24: 30 mmol via INTRAVENOUS
  Filled 2018-04-24: qty 10

## 2018-04-24 MED ORDER — FENTANYL CITRATE (PF) 100 MCG/2ML IJ SOLN: 50.0000 ug | INTRAMUSCULAR | Status: DC | PRN

## 2018-04-24 MED ORDER — POTASSIUM CHLORIDE 20 MEQ/15ML (10%) PO SOLN
40.0000 meq | ORAL | Status: AC
  Administered 2018-04-24 (×3): 40 meq
  Filled 2018-04-24 (×3): qty 30

## 2018-04-24 MED ORDER — POTASSIUM CHLORIDE 10 MEQ/100ML IV SOLN
10.0000 meq | INTRAVENOUS | Status: AC
  Administered 2018-04-24 – 2018-04-25 (×3): 10 meq via INTRAVENOUS
  Filled 2018-04-24 (×3): qty 100

## 2018-04-24 NOTE — Progress Notes (Signed)
eLink Physician-Brief Progress Note Patient Name: [name] [name] DOB: 24-May-1940 MRN: [id number]   Date of Service  04/24/2018  HPI/Events of Note  PO4--- = 2.2 and Creatinine = 0.58.  eICU Interventions  Will replace PO4---.     Intervention Category Major Interventions: Electrolyte abnormality - evaluation and management  Sommer,Steven Eugene 04/24/2018, 6:38 AM

## 2018-04-24 NOTE — Progress Notes (Signed)
eLink Physician-Brief Progress Note Patient Name: [name] [name] DOB: 10/03/1940 MRN: [id number]   Date of Service  04/24/2018  HPI/Events of Note  K 3.5, Cr normal. Off of pressors.  On abx.  LA 2.4. wbc trending down.   eICU Interventions  Kcl 10 meq q1 hr x 3 doses. Follow BMP in AM. Follow LA in am.      Intervention Category Minor Interventions: Electrolytes abnormality - evaluation and management  Ranee Gosselin 04/24/2018, 10:22 PM

## 2018-04-24 NOTE — Progress Notes (Signed)
[facility] ADULT ICU REPLACEMENT PROTOCOL FOR AM LAB REPLACEMENT ONLY  The patient does apply for the [facility] Adult ICU Electrolyte Replacment Protocol based on the criteria listed below:   1. Is GFR >/= 40 ml/min? Yes.    Patient's GFR today is >60 2. Is urine output >/= 0.5 ml/kg/hr for the last 6 hours? Yes.   Patient's UOP is 1.2 ml/kg/hr 3. Is BUN < 60 mg/dL? Yes.    Patient's BUN today is 9 4. Abnormal electrolyte(s): K-2.6 5. Ordered repletion with: per protocol 6. If a panic level lab has been reported, has the CCM MD in charge been notified? Yes.  .   Physician:  Dr. Janne Lab, Dixon Boos 04/24/2018 6:27 AM
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
## 2018-04-24 NOTE — Progress Notes (Signed)
CRITICAL VALUE ALERT  Critical Value:  Lactic acid 2.4  Date & Time Notied:  04/24/2018  Provider Notified: Dr Marlane Mingle  Orders Received/Actions taken: MD notified

## 2018-04-24 NOTE — Consult Note (Signed)
WOC Nurse wound consult note Reason for Consult: sacral/ buttock and heel wounds Wound type: Right Buttock Pressure Injury POA: Yes, deep tissue injury  (DTPI) 50%, 50% has evolved into a Stage 3, Measurement: 4 x 4.5 x 1cm Left buttock with linear DTPI, 4x 0.1 dark reddish purple. Wound bed: granulating, red, some dark areas concerning for slough development Drainage (amount, consistency, odor) scant serosanguinous on foam dressing, changed.  Periwound: Intact, signs of former sacral, buttock wounds in area, tender, pink skin Dressing procedure/placement/frequency: Santyl, moist gauze and foam dressing change every shift and prn  Wound type: right heel unstageable wound because it is covered with eschar; no odor, drainage, or fluctuance. Pressure Injury POA: Yes Measurement: 6 x 10cm Wound bed: dark colored escar Drainage (amount, consistency, odor) dry Periwound: skin is very flaky and cracked It is best practice to leave dry stable eschar intact Dressing procedure/placement/frequency: continue with foam dressing protocol and Prevalon boots bilaterally.  Left anterior foot also has a DTPI, 0.5x 0.5 with dark red/purple coloring. Foam dressing and prevalon boot. Patient is on low air loss bed for pressure reduction.  Pt is critically ill with multisystem comorbidities.  Wound Care orders placed.  Janna Arch. Smith RN Please re-consult if further assistance is needed.  Thank-you,  Cammie Mcgee MSN, RN, CWOCN, [place], CNS [phone]

## 2018-04-24 NOTE — Progress Notes (Signed)
eLink Physician-Brief Progress Note Patient Name: [name] [name] DOB: 08/10/1940 MRN: [id number]   Date of Service  04/24/2018  HPI/Events of Note  Mag and po4 low. Cr normal. Follow up K pending. Earlier LA > 3.   eICU Interventions  Mag sulfate 2gm IV and sodium phosphate 30 mmols IV , LA 2 nd set ordered.      Intervention Category Intermediate Interventions: Electrolyte abnormality - evaluation and management  Ranee Gosselin 04/24/2018, 8:35 PM

## 2018-04-24 NOTE — Progress Notes (Signed)
CRITICAL VALUE ALERT  Critical Value:  2.6  Date & Time Notied:  04/24/2018 @ 2831  Provider Notified: Pola Corn MD  Orders Received/Actions taken: new orders

## 2018-04-25 DIAGNOSIS — J15211 Pneumonia due to Methicillin susceptible Staphylococcus aureus: Secondary | ICD-10-CM

## 2018-04-25 LAB — CBC
HCT: 30.8 % — ABNORMAL LOW (ref 39.0–52.0)
Hemoglobin: 9.3 g/dL — ABNORMAL LOW (ref 13.0–17.0)
MCH: 25.4 pg — ABNORMAL LOW (ref 26.0–34.0)
MCHC: 30.2 g/dL (ref 30.0–36.0)
MCV: 84.2 fL (ref 80.0–100.0)
NRBC: 0 % (ref 0.0–0.2)
Platelets: 176 10*3/uL (ref 150–400)
RBC: 3.66 MIL/uL — ABNORMAL LOW (ref 4.22–5.81)
RDW: 15.4 % (ref 11.5–15.5)
WBC: 9.6 10*3/uL (ref 4.0–10.5)

## 2018-04-25 LAB — BASIC METABOLIC PANEL
Anion gap: 7 (ref 5–15)
BUN: 6 mg/dL — ABNORMAL LOW (ref 8–23)
CO2: 24 mmol/L (ref 22–32)
Calcium: 7.2 mg/dL — ABNORMAL LOW (ref 8.9–10.3)
Chloride: 108 mmol/L (ref 98–111)
Creatinine, Ser: 0.4 mg/dL — ABNORMAL LOW (ref 0.61–1.24)
GFR calc Af Amer: >60 mL/min (ref >60)
GFR calc non Af Amer: >60 mL/min (ref >60)
Glucose, Bld: 159 mg/dL — ABNORMAL HIGH (ref 70–99)
Potassium: 3.3 mmol/L — ABNORMAL LOW (ref 3.5–5.1)
Sodium: 139 mmol/L (ref 135–145)

## 2018-04-25 LAB — GLUCOSE, CAPILLARY
GLUCOSE-CAPILLARY: 141 mg/dL — AB (ref 70–99)
GLUCOSE-CAPILLARY: 144 mg/dL — AB (ref 70–99)
Glucose-Capillary: 119 mg/dL — ABNORMAL HIGH (ref 70–99)
Glucose-Capillary: 119 mg/dL — ABNORMAL HIGH (ref 70–99)
Glucose-Capillary: 111 mg/dL — ABNORMAL HIGH (ref 70–99)

## 2018-04-25 LAB — LACTIC ACID, PLASMA: LACTIC ACID, VENOUS: 1.8 mmol/L (ref 0.5–1.9)

## 2018-04-25 LAB — URINE CULTURE: Culture: >=100000 — AB

## 2018-04-25 LAB — POTASSIUM: POTASSIUM: 3.5 mmol/L (ref 3.5–5.1)

## 2018-04-25 LAB — PROCALCITONIN: Procalcitonin: 2.02 ng/mL

## 2018-04-25 MED ORDER — FENTANYL CITRATE (PF) 100 MCG/2ML IJ SOLN
25.0000 ug | INTRAMUSCULAR | Status: DC | PRN
  Administered 2018-04-25 – 2018-04-26 (×3): 25 ug via INTRAVENOUS
  Filled 2018-04-25 (×2): qty 2

## 2018-04-25 MED ORDER — VANCOMYCIN HCL 10 G IV SOLR
1250.0000 mg | INTRAVENOUS | Status: DC
  Administered 2018-04-26: 1250 mg via INTRAVENOUS
  Filled 2018-04-25: qty 1250

## 2018-04-25 MED ORDER — POTASSIUM CHLORIDE 20 MEQ/15ML (10%) PO SOLN: 20.0000 meq | ORAL | Status: DC

## 2018-04-25 MED ORDER — POTASSIUM CHLORIDE 10 MEQ/100ML IV SOLN
10.0000 meq | INTRAVENOUS | Status: AC
  Administered 2018-04-25 (×4): 10 meq via INTRAVENOUS
  Filled 2018-04-25 (×4): qty 100

## 2018-04-25 MED ORDER — FENTANYL CITRATE (PF) 100 MCG/2ML IJ SOLN
INTRAMUSCULAR | Status: AC
  Filled 2018-04-25: qty 2

## 2018-04-25 MED ORDER — SODIUM CHLORIDE 0.9 % IV BOLUS
500.0000 mL | Freq: Once | INTRAVENOUS | Status: AC
  Administered 2018-04-25: 500 mL via INTRAVENOUS

## 2018-04-25 MED ORDER — VANCOMYCIN HCL 10 G IV SOLR
1500.0000 mg | Freq: Once | INTRAVENOUS | Status: DC
  Filled 2018-04-25: qty 1500
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
## 2018-04-25 NOTE — Progress Notes (Signed)
[facility] ADULT ICU REPLACEMENT PROTOCOL FOR AM LAB REPLACEMENT ONLY  The patient does apply for the [facility] Adult ICU Electrolyte Replacment Protocol based on the criteria listed below:   1. Is GFR >/= 40 ml/min? Yes.    Patient's GFR today is >60 2. Is urine output >/= 0.5 ml/kg/hr for the last 6 hours? Yes.   Patient's UOP is .7 ml/kg/hr 3. Is BUN < 60 mg/dL? Yes.    Patient's BUN today is 6 4. Abnormal electrolyte(s): K-3.3 5. Ordered repletion with: per protocol 6. If a panic level lab has been reported, has the CCM MD in charge been notified? Yes.  .   Physician:  Dr Holly Bodily, Dixon Boos 04/25/2018 5:47 AM

## 2018-04-25 NOTE — Progress Notes (Signed)
Pharmacy Antibiotic Note  [name] [name] is a 78 y.o. male admitted on 04/22/2018 with sepsis.  Pharmacy has been consulted for Cefepime dosing. Patient grew out >100k proteus mirabilis in UCx but given chronic foley, unsure if this is pathogen or colonization. Of not, patient continues to have low grade fever with trach aspirate from 2/19 growing few staph aureus   Plan: Continue Cefepime 2g IV q12h Trend WBC, temp, renal function  Monitor cultures    Height: 5\' 8"  (172.7 cm) Weight: 183 lb 3.2 oz (83.1 kg) IBW/kg (Calculated) : 68.4  Temp (24hrs), Avg:99.3 F (37.4 C), Min:78.4 F (25.8 C), Max:101.8 F (38.8 C)  Recent Labs  Lab 04/22/18 2319 04/23/18 0116 04/23/18 0633 04/23/18 1016 04/24/18 0525 04/24/18 2103 04/25/18 0326 04/25/18 0329  WBC 13.5*  --  16.3*  --  12.1*  --   --  9.6  CREATININE 1.46*  --  1.13  --  0.58*  --   --  0.40*  LATICACIDVEN 2.1* 2.7* 5.4* 3.4*  --  2.4* 1.8  --     Estimated Creatinine Clearance: 83.8 mL/min (A) (by C-G formula based on SCr of 0.4 mg/dL (L)).    No Known Allergies   Vinnie Level, PharmD., BCPS Clinical Pharmacist Clinical phone for 04/25/18 until 3:30pm: [phone] If after 3:30pm, please refer to [facility] for unit-specific pharmacist

## 2018-04-25 NOTE — Progress Notes (Cosign Needed)
eLink Physician-Brief Progress Note Patient Name: [name] [name] DOB: 04-24-1940 MRN: [id number]   Date of Service  04/25/2018  HPI/Events of Note  K 3.3 post 3 doses Kcl IV. Cr normal.   eICU Interventions  Kcl 10 meq q1 hr x 4 doses.  Follow K level at 11 am.  Dced standing kcl oral orders.      Intervention Category Intermediate Interventions: Electrolyte abnormality - evaluation and management  Ranee Gosselin 04/25/2018, 5:58 AM

## 2018-04-25 NOTE — Procedures (Signed)
Extubation Procedure Note  Patient Details:   Name: [name] [name] DOB: 10-04-1940 MRN: [id number]   Airway Documentation:    Vent end date: 04/25/18 Vent end time: 1510   Evaluation  O2 sats: stable throughout Complications: No apparent complications Patient did tolerate procedure well. Bilateral Breath Sounds: Clear, Diminished   Yes pt able to vocalize.  Pt extubated at this time per MD order. Pt able to breathe around deflated cuff. No stridor noted. Pt has adequate cough. Pt placed on 4L and tolerating well at this time.    Loyal Jacobson [facility] 04/25/2018, 3:20 PM

## 2018-04-25 NOTE — Progress Notes (Signed)
eLink Physician-Brief Progress Note Patient Name: [name] [name] DOB: [birth year] MRN: [id number]   Date of Service  04/25/2018  HPI/Events of Note  Pt extubated earlier in the day and is doing well.  On video assessment, pt is sleeping comfortably.  He is no in distress.  He is complaining of pain.  He takes opiates at home for chronic pain.   eICU Interventions  He is still NPO.  Fentanyl ordered prn.      Intervention Category Intermediate Interventions: Pain - evaluation and management  Larinda Buttery 04/25/2018, 9:54 PM

## 2018-04-26 ENCOUNTER — Inpatient Hospital Stay (HOSPITAL_COMMUNITY): Payer: Medicare PPO

## 2018-04-26 DIAGNOSIS — J1520 Pneumonia due to staphylococcus, unspecified: Secondary | ICD-10-CM

## 2018-04-26 LAB — BASIC METABOLIC PANEL
ANION GAP: 7 (ref 5–15)
BUN: 5 mg/dL — ABNORMAL LOW (ref 8–23)
CO2: 27 mmol/L (ref 22–32)
Calcium: 7.3 mg/dL — ABNORMAL LOW (ref 8.9–10.3)
Chloride: 105 mmol/L (ref 98–111)
Creatinine, Ser: 0.38 mg/dL — ABNORMAL LOW (ref 0.61–1.24)
GFR calc Af Amer: >60 mL/min (ref >60)
GFR calc non Af Amer: >60 mL/min (ref >60)
Glucose, Bld: 101 mg/dL — ABNORMAL HIGH (ref 70–99)
Potassium: 2.9 mmol/L — ABNORMAL LOW (ref 3.5–5.1)
Sodium: 139 mmol/L (ref 135–145)

## 2018-04-26 LAB — GLUCOSE, CAPILLARY
GLUCOSE-CAPILLARY: 92 mg/dL (ref 70–99)
Glucose-Capillary: 72 mg/dL (ref 70–99)
Glucose-Capillary: 81 mg/dL (ref 70–99)
Glucose-Capillary: 82 mg/dL (ref 70–99)
Glucose-Capillary: 93 mg/dL (ref 70–99)
Glucose-Capillary: 98 mg/dL (ref 70–99)

## 2018-04-26 LAB — CBC WITH DIFFERENTIAL/PLATELET
Abs Immature Granulocytes: 0.03 10*3/uL (ref 0.00–0.07)
BASOS ABS: 0 10*3/uL (ref 0.0–0.1)
Basophils Relative: 0 %
Eosinophils Absolute: 0 10*3/uL (ref 0.0–0.5)
Eosinophils Relative: 1 %
HCT: 28.7 % — ABNORMAL LOW (ref 39.0–52.0)
Hemoglobin: 8.8 g/dL — ABNORMAL LOW (ref 13.0–17.0)
IMMATURE GRANULOCYTES: 1 %
Lymphocytes Relative: 18 %
Lymphs Abs: 1.2 10*3/uL (ref 0.7–4.0)
MCH: 25.3 pg — ABNORMAL LOW (ref 26.0–34.0)
MCHC: 30.7 g/dL (ref 30.0–36.0)
MCV: 82.5 fL (ref 80.0–100.0)
Monocytes Absolute: 0.6 10*3/uL (ref 0.1–1.0)
Monocytes Relative: 9 %
NEUTROS ABS: 4.8 10*3/uL (ref 1.7–7.7)
NEUTROS PCT: 71 %
Platelets: 161 10*3/uL (ref 150–400)
RBC: 3.48 MIL/uL — ABNORMAL LOW (ref 4.22–5.81)
RDW: 15.9 % — ABNORMAL HIGH (ref 11.5–15.5)
WBC: 6.7 10*3/uL (ref 4.0–10.5)
nRBC: 0 % (ref 0.0–0.2)

## 2018-04-26 LAB — MAGNESIUM: Magnesium: 1.5 mg/dL — ABNORMAL LOW (ref 1.7–2.4)

## 2018-04-26 LAB — PHOSPHORUS: PHOSPHORUS: 1.6 mg/dL — AB (ref 2.5–4.6)

## 2018-04-26 LAB — CULTURE, RESPIRATORY W GRAM STAIN

## 2018-04-26 MED ORDER — SODIUM PHOSPHATES 45 MMOLE/15ML IV SOLN
10.0000 mmol | Freq: Once | INTRAVENOUS | Status: AC
  Administered 2018-04-26: 10 mmol via INTRAVENOUS
  Filled 2018-04-26: qty 3.33

## 2018-04-26 MED ORDER — ALTEPLASE 2 MG IJ SOLR
2.0000 mg | Freq: Once | INTRAMUSCULAR | Status: AC
  Administered 2018-04-26: 2 mg
  Filled 2018-04-26: qty 2

## 2018-04-26 MED ORDER — MAGNESIUM SULFATE IN D5W 1-5 GM/100ML-% IV SOLN
1.0000 g | Freq: Once | INTRAVENOUS | Status: AC
  Administered 2018-04-26: 1 g via INTRAVENOUS
  Filled 2018-04-26: qty 100

## 2018-04-26 MED ORDER — POTASSIUM CHLORIDE 10 MEQ/100ML IV SOLN
10.0000 meq | INTRAVENOUS | Status: AC
  Administered 2018-04-26 (×5): 10 meq via INTRAVENOUS
  Filled 2018-04-26 (×5): qty 100
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
## 2018-04-26 NOTE — Evaluation (Signed)
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 Intubated 2/18-2/20.   Clinical Impression   Pt is at his baseline and dependent in all mobility and ADL. No OT needs.    Follow Up Recommendations  SNF;Supervision/Assistance - 24 hour    Equipment Recommendations       Recommendations for Other Services       Precautions / Restrictions Restrictions Weight Bearing Restrictions: No      Mobility Bed Mobility Overal bed mobility: Needs Assistance Bed Mobility: Sit to Supine;Supine to Sit     Supine to sit: Total assist;+2 for physical assistance Sit to supine: Total assist;+2 for physical assistance   General bed mobility comments: assist for all aspects  Transfers                 General transfer comment: deferred will require lift equipment    Balance Overall balance assessment: Needs assistance   Sitting balance-Leahy Scale: Poor Sitting balance - Comments: min guard assist for a few seconds                                   ADL either performed or assessed with clinical judgement   ADL                                         General ADL Comments: total assist     Vision Patient Visual Report: No change from baseline       Perception     Praxis      Pertinent Vitals/Pain Pain Assessment: No/denies pain     Hand Dominance Right   Extremity/Trunk Assessment Upper Extremity Assessment Upper Extremity Assessment: Generalized weakness   Lower Extremity Assessment Lower Extremity Assessment: Defer to PT evaluation   Cervical / Trunk Assessment Cervical / Trunk Assessment: Other exceptions(weakness, no control)   Communication  Communication Communication: No difficulties   Cognition Arousal/Alertness: Awake/alert Behavior During Therapy: WFL for tasks assessed/performed Overall Cognitive Status: No family/caregiver present to determine baseline cognitive functioning                                 General Comments: pt is disoriented, a poor historian and hallucinating during session   General Comments       Exercises     Shoulder Instructions      Home Living Family/patient expects to be discharged to:: Skilled nursing facility                                        Prior Functioning/Environment          Comments: unreliable historian. hallucinating seeing cats. reports walks and goes to gym. lives alone. per chart lives at [facility]          OT Problem List:        OT Treatment/Interventions:      OT Goals(Current goals can be found in the care plan section)  OT Frequency:     Barriers to D/C:            Co-evaluation   Reason for Co-Treatment: Complexity of the patient's impairments (multi-system involvement) PT goals addressed during session: Mobility/safety with mobility        AM-PAC OT "6 Clicks" Daily Activity     Outcome Measure Help from another person eating meals?: Total Help from another person taking care of personal grooming?: Total Help from another person toileting, which includes using toliet, bedpan, or urinal?: Total Help from another person bathing (including washing, rinsing, drying)?: Total Help from another person to put on and taking off regular upper body clothing?: Total Help from another person to put on and taking off regular lower body clothing?: Total 6 Click Score: 6   End of Session Equipment Utilized During Treatment: Oxygen  Activity Tolerance: Patient tolerated treatment well Patient left: in bed;with call bell/phone within reach;with bed alarm set  OT Visit Diagnosis: Muscle weakness (generalized)  (M62.81)                Time: 6294-7654 OT Time Calculation (min): 17 min Charges:  OT General Charges $OT Visit: 1 Visit OT Evaluation $OT Eval High Complexity: 1 High  Martie Round, OTR/L Acute Rehabilitation Services Pager: [phone] Office: [phone]  Evern Bio 04/26/2018, 3:43 PM

## 2018-04-26 NOTE — Progress Notes (Signed)
Spoke with patient's sister, Wandra Arthurs, [facility]) regarding patient's transfer to the floor and the situation regarding initiating tube feedings. Lupita Leash stated she and her family did not want a cortrak placed and did not want tube feedings to be initiated.
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
## 2018-04-26 NOTE — Evaluation (Addendum)
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 Intubated 2/18-2/20.    Clinical Impression  Patient evaluated by Physical Therapy with no further acute PT needs identified. All education has been completed and the patient has no further questions. PTA pt living at SNF, today reports he is independent and lives alone goes to gym, also hallucinating seeing cats in the room. Total A to sit EOB, able to balance himself with intermittent contact guard.See below for any follow-up Physical Therapy or equipment needs. PT is signing off. Thank you for this referral.      Follow Up Recommendations SNF (return to prior living arrangement)     Equipment Recommendations       Recommendations for Other Services       Precautions / Restrictions Restrictions Weight Bearing Restrictions: No      Mobility  Bed Mobility Overal bed mobility: Needs Assistance Bed Mobility: Sit to Supine;Supine to Sit     Supine to sit: Total assist;+2 for physical assistance Sit to supine: Total assist;+2 for physical assistance      Transfers                    Ambulation/Gait                Stairs            Wheelchair Mobility    Modified Rankin (Stroke Patients Only)       Balance Overall balance assessment: Needs assistance   Sitting balance-Leahy Scale: Fair Sitting balance - Comments: able to toelrate sitting balance on his own                                     Pertinent Vitals/Pain Pain Assessment: No/denies pain    Home Living Family/patient expects to be discharged to:: Skilled nursing facility                      Prior Function           Comments: unreliable historian.  hallucinating seeing cats. reports walks and goes to gym. lives alone. per chart lives at [facility]       Hand Dominance   Dominant Hand: Right    Extremity/Trunk Assessment   Upper Extremity Assessment Upper Extremity Assessment: Defer to OT evaluation    Lower Extremity Assessment Lower Extremity Assessment: Generalized weakness(hx of C6 SCI, 2-/5 grossly)       Communication   Communication: No difficulties  Cognition Arousal/Alertness: Awake/alert Behavior During Therapy: WFL for tasks assessed/performed Overall Cognitive Status: Within Functional Limits for tasks assessed                                        General Comments      Exercises     Assessment/Plan    PT Assessment Patent does not need any further PT services  PT Problem List Decreased strength       PT Treatment Interventions      PT Goals (Current goals can be found in the Care Plan section)  Frequency     Barriers to discharge Inaccessible home environment      Co-evaluation PT/OT/SLP Co-Evaluation/Treatment: Yes Reason for Co-Treatment: Complexity of the patient's impairments (multi-system involvement) PT goals addressed during session: Mobility/safety with mobility         AM-PAC PT "6 Clicks" Mobility  Outcome Measure Help needed turning from your back to your side while in a flat bed without using bedrails?: Total Help needed moving from lying on your back to sitting on the side of a flat bed without using bedrails?: Total Help needed moving to and from a bed to a chair (including a wheelchair)?: Total Help needed standing up from a chair using your arms (e.g., wheelchair or bedside chair)?: Total Help needed to walk in hospital room?: Total Help needed climbing 3-5 steps with a railing? : Total 6 Click Score: 6    End of Session Equipment Utilized During Treatment: Gait belt Activity Tolerance: Patient tolerated treatment well Patient left: in  bed;with call bell/phone within reach;with bed alarm set Nurse Communication: Mobility status PT Visit Diagnosis: Unsteadiness on feet (R26.81)    Time: 7588-3254 PT Time Calculation (min) (ACUTE ONLY): 25 min   Charges:   PT Evaluation $PT Eval Moderate Complexity: 1 Mod         Etta Grandchild, PT, DPT Acute Rehabilitation Services Pager: [phone] Office: [phone]    Etta Grandchild 04/26/2018, 3:27 PM

## 2018-04-26 NOTE — Progress Notes (Signed)
eLink Physician-Brief Progress Note Patient Name: [name] [name] DOB: [birth year] MRN: [id number]   Date of Service  04/26/2018  HPI/Events of Note  Informed of low K, Phos and Mg. Pt remains NPO.  He has been doing well since being extubated.  eICU Interventions  Replete KCl, Phos and Mg IV. Swallow evaluation this morning.     Intervention Category Intermediate Interventions: Electrolyte abnormality - evaluation and management  Larinda Buttery 04/26/2018, 4:44 AM

## 2018-04-26 NOTE — Progress Notes (Signed)
Nutrition Follow-up  DOCUMENTATION CODES:   Not applicable  INTERVENTION:    Diet advancement for comfort feedings vs place Cortrak tube for tube feedings.   If Cortrak placed, recommend Jevity 1.2 at 25 ml/h, increase by 10 ml every 4 hours to goal rate of 65 ml/h (1560 ml per day) with Pro-stat 30 ml BID to provide 2072 kcal, 117 gm protein, 1264 ml free water daily  NUTRITION DIAGNOSIS:   Inadequate oral intake related to dysphagia as evidenced by NPO status.  Ongoing  GOAL:   Patient will meet greater than or equal to 90% of their needs  Unmet  MONITOR:   Diet advancement, I & O's  REASON FOR ASSESSMENT:   Ventilator, Consult Enteral/tube feeding initiation and management  ASSESSMENT:   78 year old male who presented to the ED from [facility] on 2/17 with respiratory distress and AMS. PMH significant for CAD, HTN, HLD, paraplegia from C6 fracture. Pt was intubated in the ED and admitted with sepsis and suspected aspiration PNA.  Patient was extubated 2/20. TF off since extubation.  S/P swallow evaluation with SLP this morning and SLP recommends NPO due to high aspiration risk.  Patient has been stating that he does not want any further interventions. Plans to contact POA for decisions regarding advancing PO diet for comfort feedings vs placing a Cortrak for TF.   Labs reviewed. Potassium 2.9 (L), phosphorus 1.6 (L), magnesium 1.5 (L) Medications reviewed and include potassium chloride, magnesium sulfate, sodium phosphate for repletion.   No family available and patient unable to provide accurate nutrition history.   Diet Order:   Diet Order            Diet NPO time specified  Diet effective now              EDUCATION NEEDS:   Not appropriate for education at this time  Skin:  Skin Assessment: Skin Integrity Issues: Skin Integrity Issues:: Unstageable Unstageable: sacrum, R heel  Last BM:  2/21 (rectal pouch)  Height:   Ht Readings  from Last 1 Encounters:  04/22/18 5\' 8"  (1.727 m)    Weight:   Wt Readings from Last 1 Encounters:  04/26/18 84.1 kg    Ideal Body Weight:  66.5 kg(adjusted for paraplegia)  Estimated Nutritional Needs:   Kcal:  1900-2100  Protein:  105-120 gm  Fluid:  >/= 1.9 L    Joaquin Courts, RD, LDN, CNSC Pager [phone] After Hours Pager [phone]

## 2018-04-26 NOTE — Progress Notes (Signed)
CRITICAL VALUE ALERT  Critical Value:    K= 2.9 Phos = 1.6 Mg = 1.5  Date & Time Notied:  04/26/2018 @ 0441  Provider Notified: Pola Corn MD  Orders Received/Actions taken: new orders

## 2018-04-27 DIAGNOSIS — I959 Hypotension, unspecified: Secondary | ICD-10-CM

## 2018-04-27 DIAGNOSIS — I2510 Atherosclerotic heart disease of native coronary artery without angina pectoris: Secondary | ICD-10-CM

## 2018-04-27 LAB — CBC WITH DIFFERENTIAL/PLATELET
ABS IMMATURE GRANULOCYTES: 0.03 10*3/uL (ref 0.00–0.07)
Basophils Absolute: 0 10*3/uL (ref 0.0–0.1)
Basophils Relative: 0 %
EOS ABS: 0.1 10*3/uL (ref 0.0–0.5)
Eosinophils Relative: 1 %
HCT: 28.6 % — ABNORMAL LOW (ref 39.0–52.0)
Hemoglobin: 9.1 g/dL — ABNORMAL LOW (ref 13.0–17.0)
IMMATURE GRANULOCYTES: 1 %
Lymphocytes Relative: 18 %
Lymphs Abs: 1.1 10*3/uL (ref 0.7–4.0)
MCH: 25.9 pg — ABNORMAL LOW (ref 26.0–34.0)
MCHC: 31.8 g/dL (ref 30.0–36.0)
MCV: 81.3 fL (ref 80.0–100.0)
Monocytes Absolute: 0.6 10*3/uL (ref 0.1–1.0)
Monocytes Relative: 10 %
NEUTROS PCT: 70 %
Neutro Abs: 4.1 10*3/uL (ref 1.7–7.7)
PLATELETS: 181 10*3/uL (ref 150–400)
RBC: 3.52 MIL/uL — ABNORMAL LOW (ref 4.22–5.81)
RDW: 16.1 % — AB (ref 11.5–15.5)
WBC: 5.8 10*3/uL (ref 4.0–10.5)
nRBC: 0 % (ref 0.0–0.2)

## 2018-04-27 LAB — GLUCOSE, CAPILLARY
Glucose-Capillary: 69 mg/dL — ABNORMAL LOW (ref 70–99)
Glucose-Capillary: 79 mg/dL (ref 70–99)
Glucose-Capillary: 73 mg/dL (ref 70–99)
Glucose-Capillary: 101 mg/dL — ABNORMAL HIGH (ref 70–99)

## 2018-04-27 LAB — BASIC METABOLIC PANEL
Anion gap: 9 (ref 5–15)
BUN: 8 mg/dL (ref 8–23)
CO2: 26 mmol/L (ref 22–32)
Calcium: 7.6 mg/dL — ABNORMAL LOW (ref 8.9–10.3)
Chloride: 106 mmol/L (ref 98–111)
Creatinine, Ser: 0.46 mg/dL — ABNORMAL LOW (ref 0.61–1.24)
GFR calc Af Amer: >60 mL/min (ref >60)
Glucose, Bld: 84 mg/dL (ref 70–99)
POTASSIUM: 3 mmol/L — AB (ref 3.5–5.1)
Sodium: 141 mmol/L (ref 135–145)

## 2018-04-27 LAB — CULTURE, BLOOD (ROUTINE X 2)
Culture: NO GROWTH
Culture: NO GROWTH
Special Requests: ADEQUATE

## 2018-04-27 LAB — PHOSPHORUS: Phosphorus: 2.5 mg/dL (ref 2.5–4.6)

## 2018-04-27 LAB — MAGNESIUM: Magnesium: 1.6 mg/dL — ABNORMAL LOW (ref 1.7–2.4)

## 2018-04-27 MED ORDER — POTASSIUM CHLORIDE CRYS ER 20 MEQ PO TBCR: 40.0000 meq | EXTENDED_RELEASE_TABLET | ORAL | Status: DC

## 2018-04-27 MED ORDER — ORAL CARE MOUTH RINSE
15.0000 mL | Freq: Two times a day (BID) | OROMUCOSAL | Status: DC
  Administered 2018-04-27 – 2018-04-29 (×4): 15 mL via OROMUCOSAL

## 2018-04-27 MED ORDER — MAGNESIUM SULFATE 2 GM/50ML IV SOLN
2.0000 g | Freq: Once | INTRAVENOUS | Status: AC
  Administered 2018-04-27: 2 g via INTRAVENOUS
  Filled 2018-04-27: qty 50

## 2018-04-27 MED ORDER — SODIUM CHLORIDE 0.9 % IV SOLN
2.0000 g | INTRAVENOUS | Status: DC
  Administered 2018-04-27: 2 g via INTRAVENOUS
  Filled 2018-04-27 (×2): qty 20

## 2018-04-27 MED ORDER — SODIUM CHLORIDE 0.9 % IV SOLN: 2.0000 g | INTRAVENOUS | Status: DC

## 2018-04-27 MED ORDER — POTASSIUM CHLORIDE 10 MEQ/100ML IV SOLN
10.0000 meq | INTRAVENOUS | Status: AC
  Administered 2018-04-27 (×2): 10 meq via INTRAVENOUS
  Filled 2018-04-27: qty 100

## 2018-04-27 MED ORDER — POTASSIUM CHLORIDE 10 MEQ/100ML IV SOLN
10.0000 meq | INTRAVENOUS | Status: DC
  Administered 2018-04-27: 10 meq via INTRAVENOUS
  Filled 2018-04-27: qty 100

## 2018-04-27 MED ORDER — KCL IN DEXTROSE-NACL 40-5-0.9 MEQ/L-%-% IV SOLN
INTRAVENOUS | Status: DC
  Administered 2018-04-27 – 2018-04-28 (×3): via INTRAVENOUS
  Filled 2018-04-27 (×4): qty 1000

## 2018-04-27 NOTE — Progress Notes (Signed)
pts POA here today, had very candid conversation with patient and he wishes to go back to snf and no more treatment. He is ready to die ,in his words.   [name] [name]
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
## 2018-04-28 LAB — BASIC METABOLIC PANEL
Anion gap: 4 — ABNORMAL LOW (ref 5–15)
BUN: <5 mg/dL — ABNORMAL LOW (ref 8–23)
CO2: 26 mmol/L (ref 22–32)
Calcium: 7 mg/dL — ABNORMAL LOW (ref 8.9–10.3)
Chloride: 111 mmol/L (ref 98–111)
Creatinine, Ser: <0.3 mg/dL — ABNORMAL LOW (ref 0.61–1.24)
Glucose, Bld: 128 mg/dL — ABNORMAL HIGH (ref 70–99)
POTASSIUM: 3.3 mmol/L — AB (ref 3.5–5.1)
SODIUM: 141 mmol/L (ref 135–145)

## 2018-04-28 MED ORDER — SODIUM CHLORIDE 0.9 % IV SOLN
INTRAVENOUS | Status: DC | PRN
  Administered 2018-04-28: 1000 mL via INTRAVENOUS

## 2018-04-28 MED ORDER — DIPHENOXYLATE-ATROPINE 2.5-0.025 MG PO TABS: 1.0000 | ORAL_TABLET | Freq: Four times a day (QID) | ORAL | Status: DC | PRN

## 2018-04-28 MED ORDER — SODIUM CHLORIDE 0.9 % IV SOLN
2.0000 g | INTRAVENOUS | Status: AC
  Filled 2018-04-28: qty 20

## 2018-04-28 MED ORDER — POTASSIUM CHLORIDE CRYS ER 20 MEQ PO TBCR: 40.0000 meq | EXTENDED_RELEASE_TABLET | Freq: Once | ORAL | Status: DC

## 2018-04-28 MED ORDER — MORPHINE SULFATE (PF) 2 MG/ML IV SOLN
2.0000 mg | INTRAVENOUS | Status: DC | PRN
  Administered 2018-04-28: 4 mg via INTRAVENOUS
  Administered 2018-04-28: 2 mg via INTRAVENOUS
  Filled 2018-04-28: qty 1
  Filled 2018-04-28: qty 2

## 2018-04-28 NOTE — Progress Notes (Signed)
Pt refused wound care, bath, and linen change 2/22 overnight shift.  Educated pt on importance of the above, he indicated understanding.

## 2018-04-28 NOTE — Progress Notes (Addendum)
Patient requested to have IV removed. I informed patient that he is getting IVF. Patient stated that he does not want IVF any more. Conversation witnessed by patient sister. I have also discontinued SCDs and Prevolon boots per patient request and comfort. Patient would like a bath tomorrow morning and wear his clothes from home.Dr Butler Denmark made aware. IVF discontinued per verbal order.

## 2018-04-28 NOTE — Plan of Care (Signed)
Pt denies pain with each assessment.

## 2018-04-28 NOTE — Progress Notes (Signed)
SLP Cancellation Note  Patient Details Name: [name] [name] MRN: [id number] DOB: May 31, 1940   Cancelled treatment:       Reason Eval/Treat Not Completed: Patient declined, no reason specified;Other (comment)(Pt declined PO intake; in process of being transitioned to Hospice/Palliative ); MD note indicated he would be transitioned to Palliative/Hospice when d/c to SNF; pt stated he "wanted to go home (heaven)." Discussed comfort feeding option with pt with aspiration risk and pt declined PO intake.   Tressie Stalker, M.S., CCC-SLP 04/28/2018, 12:31 PM
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
## 2018-04-28 NOTE — Progress Notes (Signed)
CSW read the notes and the patient is from [facility]. CSW reached out to admissions director. The facility is able to take the patient back. The patient will require a new insurance authorization.   Facility will need an updated Fl2.  CSW will continue to follow.   Drucilla Schmidt, MSW, LCSW-A Clinical Social Worker [employer]

## 2018-04-29 NOTE — Care Management Important Message (Signed)
Important Message  Patient Details  Name: [name] [name] MRN: [id number] Date of Birth: August 09, 1940   Medicare Important Message Given:  Yes    Dorena Bodo 04/29/2018, 4:09 PM

## 2018-04-29 NOTE — Progress Notes (Signed)
Futures trader room available today for [name] [name]. Met briefly with him to confirm interest. He deferred me to [facility] who came in and completed paper work for transfer today.   Discharge summary has been sent.   Spoke with RN by phone to request PICC line be left in for use at [facility].   RN please call report to [phone].  Thank you,  Erling Conte, LCSW [phone]
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
## 2018-04-29 NOTE — Clinical Social Work Note (Signed)
Referral made to [facility].   Alper Guilmette, LCSWA [phone]  

## 2018-04-29 NOTE — Clinical Social Work Note (Signed)
Clinical Social Worker facilitated patient discharge including contacting patient family and facility to confirm patient discharge plans.  Clinical information faxed to facility and family agreeable with plan.  CSW arranged ambulance transport via [facility] to [facility].  RN to call for report prior to discharge.  Clinical Social Worker will sign off for now as social work intervention is no longer needed. Please consult [place] again if new need arises.  [place], [place] [phone]

## 2018-04-29 NOTE — Plan of Care (Signed)
  Problem: Clinical Measurements: Goal: Ability to maintain clinical measurements within normal limits will improve Outcome: Progressing   Problem: Coping: Goal: Level of anxiety will decrease Outcome: Not Progressing   Problem: Pain Managment: Goal: General experience of comfort will improve Outcome: Progressing   

## 2018-06-05 DEATH — deceased

## 2018-06-25 ENCOUNTER — Ambulatory Visit: Payer: Medicare PPO | Admitting: Physical Medicine & Rehabilitation
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
